# Patient Record
Sex: Female | Born: 1970 | Race: White | Hispanic: No | Marital: Single | Smoking: Former smoker
Health system: Southern US, Community
[De-identification: ages and names within clinical notes are randomized; demographics above are authoritative.]

## PROBLEM LIST (undated history)

## (undated) DIAGNOSIS — K219 Gastro-esophageal reflux disease without esophagitis: Secondary | ICD-10-CM

## (undated) DIAGNOSIS — F419 Anxiety disorder, unspecified: Secondary | ICD-10-CM

## (undated) DIAGNOSIS — F32A Depression, unspecified: Secondary | ICD-10-CM

## (undated) DIAGNOSIS — G8929 Other chronic pain: Secondary | ICD-10-CM

## (undated) DIAGNOSIS — I499 Cardiac arrhythmia, unspecified: Secondary | ICD-10-CM

## (undated) DIAGNOSIS — G4730 Sleep apnea, unspecified: Secondary | ICD-10-CM

## (undated) DIAGNOSIS — M1990 Unspecified osteoarthritis, unspecified site: Secondary | ICD-10-CM

## (undated) DIAGNOSIS — S3992XA Unspecified injury of lower back, initial encounter: Secondary | ICD-10-CM

## (undated) DIAGNOSIS — I10 Essential (primary) hypertension: Secondary | ICD-10-CM

## (undated) DIAGNOSIS — D649 Anemia, unspecified: Secondary | ICD-10-CM

## (undated) DIAGNOSIS — Z5189 Encounter for other specified aftercare: Secondary | ICD-10-CM

## (undated) DIAGNOSIS — I471 Supraventricular tachycardia, unspecified: Secondary | ICD-10-CM

## (undated) DIAGNOSIS — F329 Major depressive disorder, single episode, unspecified: Secondary | ICD-10-CM

## (undated) DIAGNOSIS — K760 Fatty (change of) liver, not elsewhere classified: Secondary | ICD-10-CM

## (undated) DIAGNOSIS — M549 Dorsalgia, unspecified: Secondary | ICD-10-CM

## (undated) HISTORY — PX: WRIST SURGERY: SHX841

## (undated) HISTORY — DX: Major depressive disorder, single episode, unspecified: F32.9

## (undated) HISTORY — DX: Depression, unspecified: F32.A

## (undated) HISTORY — DX: Anxiety disorder, unspecified: F41.9

## (undated) HISTORY — DX: Gastro-esophageal reflux disease without esophagitis: K21.9

## (undated) HISTORY — DX: Sleep apnea, unspecified: G47.30

## (undated) HISTORY — DX: Unspecified injury of lower back, initial encounter: S39.92XA

## (undated) HISTORY — PX: TUBAL LIGATION: SHX77

## (undated) HISTORY — DX: Encounter for other specified aftercare: Z51.89

## (undated) HISTORY — DX: Anemia, unspecified: D64.9

---

## 2011-09-26 ENCOUNTER — Encounter (HOSPITAL_COMMUNITY): Payer: Self-pay | Admitting: *Deleted

## 2011-09-26 ENCOUNTER — Emergency Department (HOSPITAL_COMMUNITY)
Admission: EM | Admit: 2011-09-26 | Discharge: 2011-09-26 | Disposition: A | Discharge status: Home / Self Care | Payer: Self-pay | Attending: Emergency Medicine | Admitting: Emergency Medicine

## 2011-09-26 ENCOUNTER — Emergency Department (HOSPITAL_COMMUNITY): Payer: Self-pay

## 2011-09-26 DIAGNOSIS — R6889 Other general symptoms and signs: Secondary | ICD-10-CM | POA: Insufficient documentation

## 2011-09-26 DIAGNOSIS — IMO0002 Reserved for concepts with insufficient information to code with codable children: Secondary | ICD-10-CM

## 2011-09-26 LAB — CBC WITH DIFFERENTIAL/PLATELET
HCT: 40.6 % (ref 36.0–46.0)
Hemoglobin: 13.2 g/dL (ref 12.0–15.0)
Lymphs Abs: 2.7 10*3/uL (ref 0.7–4.0)
MCHC: 32.5 g/dL (ref 30.0–36.0)
Monocytes Absolute: 0.5 10*3/uL (ref 0.1–1.0)
Monocytes Relative: 5 % (ref 3–12)
Neutro Abs: 7.3 10*3/uL (ref 1.7–7.7)
Neutrophils Relative %: 68 % (ref 43–77)
RBC: 4.81 MIL/uL (ref 3.87–5.11)

## 2011-09-26 LAB — COMPREHENSIVE METABOLIC PANEL
Alkaline Phosphatase: 92 U/L (ref 39–117)
BUN: 6 mg/dL (ref 6–23)
CO2: 26 mEq/L (ref 19–32)
Chloride: 102 mEq/L (ref 96–112)
Creatinine, Ser: 0.61 mg/dL (ref 0.50–1.10)
GFR calc Af Amer: >90 mL/min (ref >90)
GFR calc non Af Amer: >90 mL/min (ref >90)
Glucose, Bld: 81 mg/dL (ref 70–99)
Potassium: 3.5 mEq/L (ref 3.5–5.1)
Total Bilirubin: 0.4 mg/dL (ref 0.3–1.2)

## 2011-09-26 MED ORDER — GLUCAGON HCL (RDNA) 1 MG IJ SOLR
1.0000 mg | Freq: Once | INTRAMUSCULAR | Status: AC
  Administered 2011-09-26: 1 mg via INTRAVENOUS
  Filled 2011-09-26: qty 1

## 2011-09-26 MED ORDER — ONDANSETRON HCL 4 MG/2ML IJ SOLN
4.0000 mg | Freq: Once | INTRAMUSCULAR | Status: AC
  Administered 2011-09-26: 4 mg via INTRAVENOUS
  Filled 2011-09-26: qty 2

## 2011-09-26 NOTE — ED Notes (Signed)
Food impaction , onset yesterday after eating pizza, vomiting mult times. Has similar episode in past.

## 2011-09-26 NOTE — ED Provider Notes (Signed)
History   This chart was scribed for Benny Lennert, MD by Charolett Bumpers . The patient was seen in room APA03/APA03. Patient's care was started at 1401.    CSN: [id number]  Arrival date & time 09/26/11  1208   First MD Initiated Contact with Patient 09/26/11 1401      No chief complaint on file.   (Consider location/radiation/quality/duration/timing/severity/associated sxs/prior treatment) HPI Comments: Pt reports that she was eating pizza yesterday, when she felt like something got stuck. Pt reports that she is able to swallow, but reports pain with swallowing. Pt states that the pain now radiates to her chest. Pt reports vomiting multiple times. Pt reports a h/o similar symptoms several years ago with a food impaction.     Patient is a 41 y.o. female presenting with foreign body swallowed. The history is provided by the patient.  Swallowed Foreign Body This is a new problem. The current episode started yesterday. The problem occurs constantly. The problem has been gradually worsening. Pertinent negatives include no chest pain, no abdominal pain and no headaches. The symptoms are aggravated by swallowing. Nothing relieves the symptoms. She has tried water for the symptoms. The treatment provided no relief.    History reviewed. No pertinent past medical history.  Past Surgical History  Procedure Date  . Wrist surgery     History reviewed. No pertinent family history.  History  Substance Use Topics  . Smoking status: Never Smoker   . Smokeless tobacco: Not on file  . Alcohol Use: No    OB History    Grav Para Term Preterm Abortions TAB SAB Ect Mult Living                  Review of Systems  Constitutional: Negative for fatigue.  HENT: Positive for trouble swallowing. Negative for congestion, sinus pressure and ear discharge.   Eyes: Negative for discharge.  Respiratory: Negative for cough.   Cardiovascular: Negative for chest pain.  Gastrointestinal:  Positive for vomiting. Negative for abdominal pain and diarrhea.  Genitourinary: Negative for frequency and hematuria.  Musculoskeletal: Negative for back pain.  Skin: Negative for rash.  Neurological: Negative for seizures and headaches.  Hematological: Negative.   Psychiatric/Behavioral: Negative for hallucinations.  All other systems reviewed and are negative.    Allergies  Review of patient's allergies indicates no known allergies.  Home Medications  No current outpatient prescriptions on file.  BP 119/75  Pulse 68  Temp 98.2 F (36.8 C) (Oral)  Resp 18  Ht 5\' 6"  (1.676 m)  Wt 237 lb (107.502 kg)  BMI 38.25 kg/m2  SpO2 100%  LMP 09/12/2011  Physical Exam  Constitutional: She is oriented to person, place, and time. She appears well-developed.  HENT:  Head: Normocephalic and atraumatic.  Right Ear: External ear normal.  Left Ear: External ear normal.  Nose: Nose normal.  Mouth/Throat: Oropharynx is clear and moist. No oropharyngeal exudate.  Eyes: Conjunctivae and EOM are normal. No scleral icterus.  Neck: Neck supple. No thyromegaly present.  Cardiovascular: Normal rate and regular rhythm.  Exam reveals no gallop and no friction rub.   No murmur heard. Pulmonary/Chest: No stridor. She has no wheezes. She has no rales. She exhibits no tenderness.  Abdominal: She exhibits no distension. There is no tenderness. There is no rebound.  Musculoskeletal: Normal range of motion. She exhibits no edema.  Lymphadenopathy:    She has no cervical adenopathy.  Neurological: She is oriented to person, place, and time. Coordination  normal.  Skin: No rash noted. No erythema.  Psychiatric: She has a normal mood and affect. Her behavior is normal.    ED Course  Procedures (including critical care time)  DIAGNOSTIC STUDIES: Oxygen Saturation is 100% on room air, normal by my interpretation.    COORDINATION OF CARE:  14:24-Discussed planned course of treatment with the patient,  who is agreeable at this time.     Labs Reviewed - No data to display No results found.   No diagnosis found.  I spoke with Dr. Kendell Bane and he will see the pt tomorrow.  She is able to drink fluids.  MDM     The chart was scribed for me under my direct supervision.  I personally performed the history, physical, and medical decision making and all procedures in the evaluation of this patient.Benny Lennert, MD 09/26/11 [phone]

## 2015-02-18 ENCOUNTER — Emergency Department (HOSPITAL_COMMUNITY)
Admission: EM | Admit: 2015-02-18 | Discharge: 2015-02-18 | Disposition: A | Discharge status: Home / Self Care | Payer: Self-pay | Attending: Emergency Medicine | Admitting: Emergency Medicine

## 2015-02-18 ENCOUNTER — Encounter (HOSPITAL_COMMUNITY): Payer: Self-pay

## 2015-02-18 ENCOUNTER — Emergency Department (HOSPITAL_COMMUNITY): Payer: Self-pay

## 2015-02-18 DIAGNOSIS — R531 Weakness: Secondary | ICD-10-CM

## 2015-02-18 DIAGNOSIS — Z79899 Other long term (current) drug therapy: Secondary | ICD-10-CM | POA: Insufficient documentation

## 2015-02-18 DIAGNOSIS — I10 Essential (primary) hypertension: Secondary | ICD-10-CM | POA: Insufficient documentation

## 2015-02-18 DIAGNOSIS — D649 Anemia, unspecified: Secondary | ICD-10-CM | POA: Insufficient documentation

## 2015-02-18 HISTORY — DX: Supraventricular tachycardia, unspecified: I47.10

## 2015-02-18 HISTORY — DX: Supraventricular tachycardia: I47.1

## 2015-02-18 HISTORY — DX: Essential (primary) hypertension: I10

## 2015-02-18 LAB — CBC WITH DIFFERENTIAL/PLATELET
Basophils Absolute: 0.1 10*3/uL (ref 0.0–0.1)
Basophils Relative: 0 %
EOS ABS: 0.4 10*3/uL (ref 0.0–0.7)
Eosinophils Relative: 3 %
HCT: 36.6 % (ref 36.0–46.0)
HEMOGLOBIN: 11.1 g/dL — AB (ref 12.0–15.0)
LYMPHS ABS: 3.2 10*3/uL (ref 0.7–4.0)
LYMPHS PCT: 26 %
MCH: 22.7 pg — AB (ref 26.0–34.0)
MCHC: 30.3 g/dL (ref 30.0–36.0)
MCV: 74.7 fL — AB (ref 78.0–100.0)
Monocytes Absolute: 0.8 10*3/uL (ref 0.1–1.0)
Monocytes Relative: 7 %
NEUTROS ABS: 7.9 10*3/uL — AB (ref 1.7–7.7)
NEUTROS PCT: 64 %
Platelets: 377 10*3/uL (ref 150–400)
RBC: 4.9 MIL/uL (ref 3.87–5.11)
RDW: 18.8 % — ABNORMAL HIGH (ref 11.5–15.5)
WBC: 12.3 10*3/uL — AB (ref 4.0–10.5)

## 2015-02-18 LAB — BASIC METABOLIC PANEL
Anion gap: 9 (ref 5–15)
BUN: 10 mg/dL (ref 6–20)
CHLORIDE: 108 mmol/L (ref 101–111)
CO2: 23 mmol/L (ref 22–32)
Calcium: 9 mg/dL (ref 8.9–10.3)
Creatinine, Ser: 0.63 mg/dL (ref 0.44–1.00)
GFR calc Af Amer: >60 mL/min (ref >60)
GFR calc non Af Amer: >60 mL/min (ref >60)
Glucose, Bld: 93 mg/dL (ref 65–99)
POTASSIUM: 3.6 mmol/L (ref 3.5–5.1)
SODIUM: 140 mmol/L (ref 135–145)

## 2015-02-18 LAB — URINALYSIS, ROUTINE W REFLEX MICROSCOPIC
Bilirubin Urine: NEGATIVE
Glucose, UA: NEGATIVE mg/dL
Hgb urine dipstick: NEGATIVE
KETONES UR: NEGATIVE mg/dL
NITRITE: NEGATIVE
PH: 5.5 (ref 5.0–8.0)
PROTEIN: NEGATIVE mg/dL
Specific Gravity, Urine: 1.025 (ref 1.005–1.030)

## 2015-02-18 LAB — TROPONIN I

## 2015-02-18 LAB — URINE MICROSCOPIC-ADD ON: RBC / HPF: NONE SEEN RBC/hpf (ref 0–5)

## 2015-02-18 LAB — CK: CK TOTAL: 100 U/L (ref 38–234)

## 2015-02-18 LAB — BRAIN NATRIURETIC PEPTIDE: B Natriuretic Peptide: 44 pg/mL (ref 0.0–100.0)

## 2015-02-18 MED ORDER — CENTRUM PO CHEW: 1.0000 | CHEWABLE_TABLET | Freq: Every day | ORAL | Status: DC

## 2015-02-18 MED ORDER — SODIUM CHLORIDE 0.9 % IV BOLUS (SEPSIS)
1000.0000 mL | Freq: Once | INTRAVENOUS | Status: AC
  Administered 2015-02-18: 1000 mL via INTRAVENOUS

## 2015-02-18 NOTE — Discharge Instructions (Signed)
As discussed, your evaluation today has been largely reassuring.  But, it is important that you monitor your condition carefully, and do not hesitate to return to the ED if you develop new, or concerning changes in your condition.   Your evaluation tonight is most notable for demonstration of anemia, consistent with vitamin deficiency.  Please use the prescribed supplements, and discuss having a repeat blood check with your primary care physician in 2 weeks.   Anemia, Nonspecific Anemia is a condition in which the concentration of red blood cells or hemoglobin in the blood is below normal. Hemoglobin is a substance in red blood cells that carries oxygen to the tissues of the body. Anemia results in not enough oxygen reaching these tissues.  CAUSES  Common causes of anemia include:   Excessive bleeding. Bleeding may be internal or external. This includes excessive bleeding from periods (in women) or from the intestine.   Poor nutrition.   Chronic kidney, thyroid, and liver disease.  Bone marrow disorders that decrease red blood cell production.  Cancer and treatments for cancer.  HIV, AIDS, and their treatments.  Spleen problems that increase red blood cell destruction.  Blood disorders.  Excess destruction of red blood cells due to infection, medicines, and autoimmune disorders. SIGNS AND SYMPTOMS   Minor weakness.   Dizziness.   Headache.  Palpitations.   Shortness of breath, especially with exercise.   Paleness.  Cold sensitivity.  Indigestion.  Nausea.  Difficulty sleeping.  Difficulty concentrating. Symptoms may occur suddenly or they may develop slowly.  DIAGNOSIS  Additional blood tests are often needed. These help your health care provider determine the best treatment. Your health care provider will check your stool for blood and look for other causes of blood loss.  TREATMENT  Treatment varies depending on the cause of the anemia. Treatment can  include:   Supplements of iron, vitamin B12, or folic acid.   Hormone medicines.   A blood transfusion. This may be needed if blood loss is severe.   Hospitalization. This may be needed if there is significant continual blood loss.   Dietary changes.  Spleen removal. HOME CARE INSTRUCTIONS Keep all follow-up appointments. It often takes many weeks to correct anemia, and having your health care provider check on your condition and your response to treatment is very important. SEEK IMMEDIATE MEDICAL CARE IF:   You develop extreme weakness, shortness of breath, or chest pain.   You become dizzy or have trouble concentrating.  You develop heavy vaginal bleeding.   You develop a rash.   You have bloody or black, tarry stools.   You faint.   You vomit up blood.   You vomit repeatedly.   You have abdominal pain.  You have a fever or persistent symptoms for more than 2-3 days.   You have a fever and your symptoms suddenly get worse.   You are dehydrated.  MAKE SURE YOU:  Understand these instructions.  Will watch your condition.  Will get help right away if you are not doing well or get worse.   This information is not intended to replace advice given to you by your health care provider. Make sure you discuss any questions you have with your health care provider.   Document Released: 03/03/2004 Document Revised: 09/26/2012 Document Reviewed: 07/20/2012 Elsevier Interactive Patient Education [employer]2016 Elsevier Inc.

## 2015-02-18 NOTE — ED Notes (Signed)
Pt reports has SVT and says had a "bad episode" 3 days ago and says hasn't felt like herself since then.  Pt says also has 3 hernias in her abd.  Reports nausea, fatigue, and SOB since the svt episode.  Pt states she was on metoprolol a few months ago but was taken off because was "doing so good."

## 2015-02-18 NOTE — ED Provider Notes (Signed)
CSN: [id number]     Arrival date & time 02/18/15  1527 History   First MD Initiated Contact with Patient 02/18/15 1730     Chief Complaint  Patient presents with  . Tachycardia    HPI  Patient presents with concern of ongoing diffuse weakness, hypersomnolence. She has a history of hypertension, supraventricular tachycardia She no longer takes any medication for her supraventricular tachycardia. 3 days ago she had a typical episode of sustained supraventricular tachycardia. Subsequent, she has felt persistently weak, without focal weakness, confusion, disorientation, syncope. She describes hypersomnolence as well. There is been generalized weight gain, though for greater than the past 3 days. Patient continues to take Lasix as prescribed.   Past Medical History  Diagnosis Date  . SVT (supraventricular tachycardia) (HCC)   . Hypertension    Past Surgical History  Procedure Laterality Date  . Wrist surgery     No family history on file. Social History  Substance Use Topics  . Smoking status: Never Smoker   . Smokeless tobacco: None  . Alcohol Use: No   OB History    No data available     Review of Systems  Constitutional:       Per HPI, otherwise negative  HENT:       Per HPI, otherwise negative  Respiratory:       Per HPI, otherwise negative  Cardiovascular:       Per HPI, otherwise negative  Gastrointestinal: Negative for vomiting.  Endocrine:       Negative aside from HPI  Genitourinary:       Neg aside from HPI   Musculoskeletal:       Per HPI, otherwise negative  Skin: Negative.   Neurological: Negative for syncope.      Allergies  Review of patient's allergies indicates no known allergies.  Home Medications   Prior to Admission medications   Medication Sig Start Date End Date Taking? Authorizing Provider  furosemide (LASIX) 20 MG tablet Take 20 mg by mouth daily.   Yes Historical Provider, MD   BP 107/53 mmHg  Pulse 69  Temp(Src) 97.9 F  (36.6 C) (Oral)  Resp 14  Ht 5\' 6"  (1.676 m)  Wt 267 lb (121.11 kg)  BMI 43.12 kg/m2  SpO2 99%  LMP 01/25/2015 Physical Exam  Constitutional: She is oriented to person, place, and time. She appears well-developed and well-nourished. No distress.  HENT:  Head: Normocephalic and atraumatic.  Eyes: Conjunctivae and EOM are normal.  Cardiovascular: Normal rate and regular rhythm.   Pulmonary/Chest: Effort normal and breath sounds normal. No stridor. No respiratory distress.  Abdominal: She exhibits no distension.  Musculoskeletal: She exhibits no edema.  Neurological: She is alert and oriented to person, place, and time. No cranial nerve deficit.  Skin: Skin is warm and dry.  Psychiatric: She has a normal mood and affect.  Nursing note and vitals reviewed.   ED Course  Procedures (including critical care time) Labs Review Labs Reviewed  CBC WITH DIFFERENTIAL/PLATELET - Abnormal; Notable for the following:    WBC 12.3 (*)    Hemoglobin 11.1 (*)    MCV 74.7 (*)    MCH 22.7 (*)    RDW 18.8 (*)    Neutro Abs 7.9 (*)    All other components within normal limits  BASIC METABOLIC PANEL  TROPONIN I  BRAIN NATRIURETIC PEPTIDE  URINALYSIS, ROUTINE W REFLEX MICROSCOPIC (NOT AT [facility])  CK    Imaging Review Dg Chest 2 View  02/18/2015  CLINICAL DATA:  Nausea fatigue and shortness of breath for 3 days EXAM: CHEST  2 VIEW COMPARISON:  09/26/2011 FINDINGS: The heart size and mediastinal contours are within normal limits. Both lungs are clear. The visualized skeletal structures are unremarkable. IMPRESSION: No active cardiopulmonary disease. Electronically Signed   By: Kennith Center M.D.   On: 02/18/2015 15:54   I have personally reviewed and evaluated these images and lab results as part of my medical decision-making.   EKG Interpretation   Date/Time:  Wednesday February 18 2015 15:39:07 EST Ventricular Rate:  73 PR Interval:  168 QRS Duration: 106 QT Interval:  398 QTC Calculation:  438 R Axis:   -5 Text Interpretation:  Normal sinus rhythm Normal ECG Sinus rhythm  hypertrophic changes Borderline ECG Confirmed by Gerhard Munch  MD  ([id number]) on 02/18/2015 6:29:05 PM     Pulse oximetry 97% room air normal  8:27 PM On repeat exam the patient is in no distress. On monitor she can do to have sinus rhythm, with occasional PAC, rate 60s. Pulse oximetry is 99% on room air She, her daughter and I had a lengthy conversation about all results, including anemia. Patient will be provided referral to local cardiology, primary care.   MDM  Patient presents with generalized weakness. Patient is awake, alert, in no distress. No evidence for sustained arrhythmia, no evidence for ongoing coronary ischemia, decompensated heart failure. No evidence for pneumonia. Patient does have evidence for vitamin deficiency anemia. Patient discharged stable condition after initiation of supplements to follow-up with local providers.  Gerhard Munch, MD 02/18/15 2028

## 2015-03-18 ENCOUNTER — Telehealth: Payer: Self-pay | Admitting: Orthopaedic Surgery

## 2015-03-18 NOTE — Telephone Encounter (Signed)
Patient called to inquire about appointment with Dr Hilda Lias, as has seen him in past for her back, work-related injury.  States she went to [facility] Emergency Room yesterday 03/17/15, for a  New back injury, also work-related; states was told "herniated disk".  Relayed that our clinic does not treat herniated disk, however, since patient "wants to see Dr Hilda Lias again if possible", we would need notes, films, reports, from [place], but also, her Workers Administrator would need to contact our office, per protocol.  Patient states she has already contacted her Workers Designer, industrial/product.

## 2015-08-25 ENCOUNTER — Encounter (HOSPITAL_COMMUNITY): Payer: Self-pay

## 2015-08-25 ENCOUNTER — Emergency Department (HOSPITAL_COMMUNITY): Payer: Self-pay

## 2015-08-25 ENCOUNTER — Emergency Department (HOSPITAL_COMMUNITY)
Admission: EM | Admit: 2015-08-25 | Discharge: 2015-08-25 | Disposition: A | Discharge status: Home / Self Care | Payer: Self-pay | Attending: Emergency Medicine | Admitting: Emergency Medicine

## 2015-08-25 DIAGNOSIS — R609 Edema, unspecified: Secondary | ICD-10-CM | POA: Insufficient documentation

## 2015-08-25 DIAGNOSIS — Z791 Long term (current) use of non-steroidal anti-inflammatories (NSAID): Secondary | ICD-10-CM | POA: Insufficient documentation

## 2015-08-25 DIAGNOSIS — M5441 Lumbago with sciatica, right side: Secondary | ICD-10-CM | POA: Insufficient documentation

## 2015-08-25 DIAGNOSIS — M549 Dorsalgia, unspecified: Secondary | ICD-10-CM

## 2015-08-25 DIAGNOSIS — M5431 Sciatica, right side: Secondary | ICD-10-CM

## 2015-08-25 DIAGNOSIS — G8929 Other chronic pain: Secondary | ICD-10-CM | POA: Insufficient documentation

## 2015-08-25 DIAGNOSIS — I10 Essential (primary) hypertension: Secondary | ICD-10-CM | POA: Insufficient documentation

## 2015-08-25 DIAGNOSIS — Z79899 Other long term (current) drug therapy: Secondary | ICD-10-CM | POA: Insufficient documentation

## 2015-08-25 MED ORDER — FUROSEMIDE 20 MG PO TABS: 20.0000 mg | ORAL_TABLET | Freq: Every day | ORAL | Status: DC

## 2015-08-25 MED ORDER — IBUPROFEN 800 MG PO TABS: 800.0000 mg | ORAL_TABLET | Freq: Three times a day (TID) | ORAL | Status: DC | PRN

## 2015-08-25 MED ORDER — CYCLOBENZAPRINE HCL 5 MG PO TABS: 5.0000 mg | ORAL_TABLET | Freq: Three times a day (TID) | ORAL | Status: DC | PRN

## 2015-08-25 MED ORDER — IBUPROFEN 800 MG PO TABS
800.0000 mg | ORAL_TABLET | Freq: Once | ORAL | Status: AC
  Administered 2015-08-25: 800 mg via ORAL
  Filled 2015-08-25: qty 1

## 2015-08-25 MED ORDER — CYCLOBENZAPRINE HCL 10 MG PO TABS
10.0000 mg | ORAL_TABLET | Freq: Once | ORAL | Status: DC
  Filled 2015-08-25: qty 1

## 2015-08-25 NOTE — ED Provider Notes (Addendum)
CSN: [id number]     Arrival date & time 08/25/15  1036 History   First MD Initiated Contact with Patient 08/25/15 1113     Chief Complaint  Patient presents with  . Back Pain     (Consider location/radiation/quality/duration/timing/severity/associated sxs/prior Treatment) Patient is a 45 y.o. female presenting with back pain.  Back Pain Location:  Lumbar spine Quality:  Aching and stiffness Stiffness is present:  All day Radiates to:  Does not radiate Pain severity:  Severe Pain is:  Same all the time Onset quality:  Gradual Duration:  5 months Timing:  Constant Progression:  Worsening Chronicity:  New Context: occupational injury   Relieved by:  Nothing Worsened by:  Movement, standing and twisting Ineffective treatments:  None tried Associated symptoms: no abdominal pain, no abdominal swelling, no bladder incontinence, no bowel incontinence, no chest pain, no dysuria, no fever, no numbness, no perianal numbness, no tingling and no weakness     Past Medical History  Diagnosis Date  . SVT (supraventricular tachycardia) (HCC)   . Hypertension    Past Surgical History  Procedure Laterality Date  . Wrist surgery     No family history on file. Social History  Substance Use Topics  . Smoking status: Never Smoker   . Smokeless tobacco: None  . Alcohol Use: No   OB History    No data available     Review of Systems  Constitutional: Negative for fever, chills and fatigue.  HENT: Negative for congestion and rhinorrhea.   Eyes: Negative for visual disturbance.  Respiratory: Negative for cough and shortness of breath.   Cardiovascular: Negative for chest pain and leg swelling.  Gastrointestinal: Negative for nausea, vomiting, abdominal pain, diarrhea and bowel incontinence.  Genitourinary: Negative for bladder incontinence, dysuria and flank pain.  Musculoskeletal: Positive for back pain. Negative for neck pain and neck stiffness.  Skin: Negative for rash.   Neurological: Negative for tingling, syncope, weakness and numbness.  Hematological: Does not bruise/bleed easily.      Allergies  Review of patient's allergies indicates no known allergies.  Home Medications   Prior to Admission medications   Medication Sig Start Date End Date Taking? Authorizing Provider  furosemide (LASIX) 20 MG tablet Take 20 mg by mouth daily.   Yes Historical Provider, MD  ibuprofen (ADVIL,MOTRIN) 800 MG tablet Take 800 mg by mouth every 8 (eight) hours as needed for mild pain.   Yes Historical Provider, MD  multivitamin-iron-minerals-folic acid (CENTRUM) chewable tablet Chew 1 tablet by mouth daily. Patient not taking: Reported on 08/25/2015 02/18/15   Gerhard Munchobert Lockwood, MD   BP 128/71 mmHg  Pulse 59  Temp(Src) 98 F (36.7 C) (Oral)  Resp 18  Ht 5\' 6"  (1.676 m)  Wt 257 lb (116.574 kg)  BMI 41.50 kg/m2  SpO2 100%  LMP 08/25/2015 (Exact Date) Physical Exam  Constitutional: She is oriented to person, place, and time. She appears well-developed and well-nourished. No distress.  HENT:  Head: Normocephalic.  Mouth/Throat: No oropharyngeal exudate.  Eyes: Pupils are equal, round, and reactive to light.  Neck: Normal range of motion. Neck supple.  Cardiovascular: Normal rate, regular rhythm, normal heart sounds and intact distal pulses.  Exam reveals no friction rub.   No murmur heard. Pulmonary/Chest: Effort normal and breath sounds normal. No respiratory distress. She has no wheezes.  Abdominal: Soft. She exhibits no distension. There is no tenderness.  Musculoskeletal: She exhibits no edema.  Moderate TTP along paraspinal lower lumbar spine and SI joints. Minimal midline  TTP. No deformity.  Neurological: She is alert and oriented to person, place, and time.  Reflex Scores:      Patellar reflexes are 2+ on the right side and 2+ on the left side.      Achilles reflexes are 2+ on the right side and 2+ on the left side. 5/5 strength in hip flexor/extensors,  knee flexion/extension, dorsi/plantarflexion, toes Strength intact to light touch and pinprick in b/l LE distally and proximally  Skin: Skin is warm. No rash noted.  Nursing note and vitals reviewed.   LOWER EXTREMITY EXAM: LEFT  INSPECTION & PALPATION: No gross deformity.  Mild, nonpitting edema No open wounds.  Mild, diffuse TTP of distal leg  SENSORY: sensation is intact to light touch in:  Superficial peroneal nerve distribution (over dorsum of foot) Deep peroneal nerve distribution (over first dorsal web space) Sural nerve distribution (over lateral aspect 5th metatarsal) Saphenous nerve distribution (over medial instep)  MOTOR:  + Motor EHL (great toe dorsiflexion) + FHL (great toe plantar flexion)  + TA (ankle dorsiflexion)  + GSC (ankle plantar flexion)  VASCULAR: 2+ dorsalis pedis and posterior tibialis pulses Capillary refill < 2 sec, toes warm and well-perfused  COMPARTMENTS: Soft, warm, well-perfused No pain with passive extension No parethesias   ED Course  Procedures (including critical care time) Labs Review Labs Reviewed - No data to display  Imaging Review No results found. I have personally reviewed and evaluated these images and lab results as part of my medical decision-making.   EKG Interpretation None      MDM  45 yo F with PMHx of prior lower back injury due to occupational injury who p/w increasing back pain, tightness in setting of resuming work this week. Pain correlates directly with resuming work. Pt also c/o b/l, L>R LE swelling. She has h/o edema for which she takes lasix daily but ran out, though she states that asymmetry of swelling is new. Regarding her back pain, she is NVI distally with no red flag sx. No loss of bowel or bladder continence, saddle anesthesia, or signs of cauda equina. No fever, chills or signs of osteo or epidural abscess. No new neurological sx to suggest cord or root compression. Suspect acute on chronic LBP  with DDD, chronic sciatica. Will treat conservatively with nsaids, muscle relaxants and PCP f/u. Regarding her leg swelling, she does have mild calf TTP, and is fairly immobile at baseline and obese. U/S shows no evidence of DVT, however. Likely asymmetric, dependent edema with possible component of chronic hypervolemia as she has run out of her lasix. I instructed her on the importance of PCP f/u for management of her lasix but will provide with short course of lasix (home med) until she can f/u. WIll d/c home. Return precautions given.  Clinical Impression: 1. Chronic back pain   2. Sciatica, right   3. Dependent edema     Disposition: Discharge  Condition: Good  I have discussed the results, Dx and Tx plan with the pt(& family if present). He/she/they expressed understanding and agree(s) with the plan. Discharge instructions discussed at great length. Strict return precautions discussed and pt &/or family have verbalized understanding of the instructions. No further questions at time of discharge.    Discharge Medication List as of 08/25/2015  1:11 PM    START taking these medications   Details  cyclobenzaprine (FLEXERIL) 5 MG tablet Take 1 tablet (5 mg total) by mouth 3 (three) times daily as needed for muscle spasms., Starting  08/25/2015, Until Discontinued, Print        Follow Up: [facility] [address] [place] [address] [phone]  Call if you would like to follow-up with a primary care doctor in [place] or with the [facility] system  [facility] [address] [place] [address] [phone]         Shaune Pollack, MD 08/25/15 1835  Shaune Pollack, MD 08/25/15 [phone]

## 2015-08-25 NOTE — ED Notes (Signed)
Pt returned from CT °

## 2015-08-25 NOTE — ED Notes (Signed)
Original back injury in feb ( workman comp ) n ow past 2 days increased pain unable to sleep  . Also left leg swelling and out of lasix

## 2015-08-25 NOTE — ED Notes (Signed)
Patient transported to CT 

## 2016-01-10 ENCOUNTER — Encounter (HOSPITAL_COMMUNITY): Payer: Self-pay | Admitting: Emergency Medicine

## 2016-01-10 ENCOUNTER — Emergency Department (HOSPITAL_COMMUNITY)
Admission: EM | Admit: 2016-01-10 | Discharge: 2016-01-10 | Disposition: A | Discharge status: Home / Self Care | Payer: Self-pay | Attending: Emergency Medicine | Admitting: Emergency Medicine

## 2016-01-10 DIAGNOSIS — I10 Essential (primary) hypertension: Secondary | ICD-10-CM | POA: Insufficient documentation

## 2016-01-10 DIAGNOSIS — Z79899 Other long term (current) drug therapy: Secondary | ICD-10-CM | POA: Insufficient documentation

## 2016-01-10 DIAGNOSIS — N760 Acute vaginitis: Secondary | ICD-10-CM | POA: Insufficient documentation

## 2016-01-10 DIAGNOSIS — B9689 Other specified bacterial agents as the cause of diseases classified elsewhere: Secondary | ICD-10-CM

## 2016-01-10 LAB — URINALYSIS, ROUTINE W REFLEX MICROSCOPIC
BILIRUBIN URINE: NEGATIVE
GLUCOSE, UA: NEGATIVE mg/dL
Hgb urine dipstick: NEGATIVE
KETONES UR: NEGATIVE mg/dL
LEUKOCYTES UA: NEGATIVE
Nitrite: NEGATIVE
PH: 5.5 (ref 5.0–8.0)
PROTEIN: NEGATIVE mg/dL
Specific Gravity, Urine: 1.01 (ref 1.005–1.030)

## 2016-01-10 LAB — WET PREP, GENITAL
Clue Cells Wet Prep HPF POC: NONE SEEN
SPERM: NONE SEEN
Trich, Wet Prep: NONE SEEN
Yeast Wet Prep HPF POC: NONE SEEN

## 2016-01-10 LAB — PREGNANCY, URINE: Preg Test, Ur: NEGATIVE

## 2016-01-10 MED ORDER — CEFTRIAXONE SODIUM 250 MG IJ SOLR
250.0000 mg | Freq: Once | INTRAMUSCULAR | Status: AC
  Administered 2016-01-10: 250 mg via INTRAMUSCULAR
  Filled 2016-01-10: qty 250

## 2016-01-10 MED ORDER — AZITHROMYCIN 250 MG PO TABS
1000.0000 mg | ORAL_TABLET | Freq: Once | ORAL | Status: AC
  Administered 2016-01-10: 1000 mg via ORAL
  Filled 2016-01-10: qty 4

## 2016-01-10 MED ORDER — METRONIDAZOLE 500 MG PO TABS: 500.0000 mg | ORAL_TABLET | Freq: Two times a day (BID) | ORAL | 0 refills | Status: DC

## 2016-01-10 MED ORDER — STERILE WATER FOR INJECTION IJ SOLN
INTRAMUSCULAR | Status: AC
  Filled 2016-01-10: qty 10

## 2016-01-10 NOTE — ED Provider Notes (Signed)
[facility]PT Provider Note   CSN: [id number] Arrival date & time: 01/10/16  1140 By signing my name below, I, Levon HedgerElizabeth Hall, attest that this documentation has been prepared under the direction and in the presence of non-physician practitioner, Audry Piliyler Nyasiah Moffet, PA-C. Electronically Signed: Levon HedgerElizabeth Hall, Scribe. 01/10/2016. 12:18 PM.   History   Chief Complaint Chief Complaint  Patient presents with  . Urinary Tract Infection    HPI Donna Long is a 45 y.o. female who presents to the Emergency Department complaining of cramping pelvic pain onset 5 days ago. Pt states she had sex with a new partner one week ago and the condom came off. Two days after the encounter, she states she felt "irritated". Pt also notes associated vaginal itching, frequent urination, malodorous urine, and vaginal discharge. No alleviating or modifying factors noted. No treatments reported. She denies any nausea, vomiting or any other associated symptoms.   The history is provided by the patient. No language interpreter was used.    Past Medical History:  Diagnosis Date  . Hypertension   . SVT (supraventricular tachycardia) (HCC)     There are no active problems to display for this patient.   Past Surgical History:  Procedure Laterality Date  . WRIST SURGERY      OB History    No data available       Home Medications    Prior to Admission medications   Medication Sig Start Date End Date Taking? Authorizing Provider  cyclobenzaprine (FLEXERIL) 5 MG tablet Take 1 tablet (5 mg total) by mouth 3 (three) times daily as needed for muscle spasms. 08/25/15   Shaune Pollackameron Isaacs, MD  furosemide (LASIX) 20 MG tablet Take 1 tablet (20 mg total) by mouth daily. 08/25/15   Shaune Pollackameron Isaacs, MD  ibuprofen (ADVIL,MOTRIN) 800 MG tablet Take 1 tablet (800 mg total) by mouth every 8 (eight) hours as needed for mild pain or moderate pain. 08/25/15   Shaune Pollackameron Isaacs, MD  multivitamin-iron-minerals-folic acid (CENTRUM)  chewable tablet Chew 1 tablet by mouth daily. Patient not taking: Reported on 08/25/2015 02/18/15   Gerhard Munchobert Lockwood, MD    Family History No family history on file.  Social History Social History  Substance Use Topics  . Smoking status: Never Smoker  . Smokeless tobacco: Never Used  . Alcohol use No     Allergies   Patient has no known allergies.   Review of Systems Review of Systems 10 systems reviewed and all are negative for acute change except as noted in the HPI.  Physical Exam Updated Vital Signs BP 127/69 (BP Location: Left Arm)   Pulse 72   Temp 97.9 F (36.6 C) (Oral)   Resp 16   Ht 5\' 6"  (1.676 m)   Wt 253 lb (114.8 kg)   LMP 01/01/2016   SpO2 100%   BMI 40.84 kg/m   Physical Exam  Constitutional: She is oriented to person, place, and time. Vital signs are normal. She appears well-developed and well-nourished. No distress.  HENT:  Head: Normocephalic and atraumatic.  Right Ear: Hearing normal.  Left Ear: Hearing normal.  Eyes: Conjunctivae and EOM are normal. Pupils are equal, round, and reactive to light.  Neck: Normal range of motion. Neck supple.  Cardiovascular: Normal rate and regular rhythm.   Pulmonary/Chest: Effort normal and breath sounds normal.  Abdominal: Soft. Bowel sounds are normal. She exhibits no distension. There is no tenderness.  Musculoskeletal: Normal range of motion.  Neurological: She is alert and oriented to person, place, and  time.  Skin: Skin is warm and dry.  Psychiatric: She has a normal mood and affect. Her speech is normal and behavior is normal. Thought content normal.  Nursing note and vitals reviewed.  Exam performed by Eston Estersyler M Wendelin Reader,  exam chaperoned Date: 01/10/2016 Pelvic exam: normal external genitalia without evidence of trauma. VULVA: normal appearing vulva with no masses, tenderness or lesion. VAGINA: normal appearing vagina with normal color and discharge, no lesions. CERVIX: normal appearing cervix without  lesions, cervical motion tenderness absent, cervical os closed with out purulent discharge; vaginal discharge - clear, Wet prep and DNA probe for chlamydia and GC obtained.   ADNEXA: normal adnexa in size, nontender and no masses UTERUS: uterus is normal size, shape, consistency and nontender.   ED Treatments / Results  DIAGNOSTIC STUDIES:  Oxygen Saturation is 100% on RA, normal by my interpretation.    COORDINATION OF CARE:  12:07 PM Discussed treatment plan with pt at bedside and pt agreed to plan.  Labs (all labs ordered are listed, but only abnormal results are displayed) Labs Reviewed  WET PREP, GENITAL - Abnormal; Notable for the following:       Result Value   WBC, Wet Prep HPF POC FEW (*)    All other components within normal limits  URINALYSIS, ROUTINE W REFLEX MICROSCOPIC (NOT AT [facility])  PREGNANCY, URINE  GC/CHLAMYDIA PROBE AMP ([place]) NOT AT [facility]    EKG  EKG Interpretation None       Radiology No results found.  Procedures Procedures (including critical care time)  Medications Ordered in ED Medications - No data to display  Initial Impression / Assessment and Plan / ED Course  I have reviewed the triage vital signs and the nursing notes.  Pertinent labs & imaging results that were available during my care of the patient were reviewed by me and considered in my medical decision making (see chart for details).  Clinical Course     Final Clinical Impressions(s) / ED Diagnoses  I have reviewed and evaluated the relevant laboratory values. I have reviewed the relevant previous healthcare records. Patient discussed with supervising physician.  ED Course:  Assessment: Pt is a 45yF who presents with lower abdominal pain x5 days. No dysuria. Notes discharge. Requests STD check. On exam, pt in NAD. Nontoxic/nonseptic appearing. VSS. Afebrile. Lungs CTA. Heart RRR. Abdomen nontender soft. GU exam without CMT or adnexal tenderness. Malodorous. Discharge  noted. UA unremarkable. GC obtained. Wet Prep with mild white cells Given Azithro/Rocephin in ED. Plan is to DC home with follow up to PCP. Given Rx Flagyl due to exam . At time of discharge, Patient is in no acute distress. Vital Signs are stable. Patient is able to ambulate. Patient able to tolerate PO.   Disposition/Plan:   DC Home Additional Verbal discharge instructions given and discussed with patient.  Pt Instructed to f/u with PCP in the next week for evaluation and treatment of symptoms. Return precautions given Pt acknowledges and agrees with plan  Supervising Physician Bethann BerkshireJoseph Zammit, MD   Final diagnoses:  BV (bacterial vaginosis)   New Prescriptions New Prescriptions   No medications on file   I personally performed the services described in this documentation, which was scribed in my presence. The recorded information has been reviewed and is accurate.    Audry Piliyler Kaiesha Tonner, PA-C 01/10/16 1301    Bethann BerkshireJoseph Zammit, MD 01/12/16 [phone]

## 2016-01-10 NOTE — ED Triage Notes (Signed)
Pt c/o frequent urination and malodorous urine.

## 2016-01-10 NOTE — Discharge Instructions (Signed)
Please read and follow all provided instructions.  Your diagnoses today include:  1. BV (bacterial vaginosis)     Tests performed today include: Vital signs. See below for your results today.   Medications prescribed:  Take as prescribed   Home care instructions:  Follow any educational materials contained in this packet.  Follow-up instructions: Please follow-up with your primary care provider for further evaluation of symptoms and treatment   Return instructions:  Please return to the Emergency Department if you do not get better, if you get worse, or new symptoms OR  - Fever (temperature greater than 101.37F)  - Bleeding that does not stop with holding pressure to the area    -Severe pain (please note that you may be more sore the day after your accident)  - Chest Pain  - Difficulty breathing  - Severe nausea or vomiting  - Inability to tolerate food and liquids  - Passing out  - Skin becoming red around your wounds  - Change in mental status (confusion or lethargy)  - New numbness or weakness    Please return if you have any other emergent concerns.  Additional Information:  Your vital signs today were: BP 127/69 (BP Location: Left Arm)    Pulse 72    Temp 97.9 F (36.6 C) (Oral)    Resp 16    Ht 5\' 6"  (1.676 m)    Wt 114.8 kg    LMP 01/01/2016    SpO2 100%    BMI 40.84 kg/m  If your blood pressure (BP) was elevated above 135/85 this visit, please have this repeated by your doctor within one month. ---------------

## 2016-01-11 LAB — GC/CHLAMYDIA PROBE AMP (~~LOC~~) NOT AT ARMC
CHLAMYDIA, DNA PROBE: NEGATIVE
NEISSERIA GONORRHEA: NEGATIVE

## 2016-03-19 ENCOUNTER — Encounter (HOSPITAL_COMMUNITY): Payer: Self-pay | Admitting: Emergency Medicine

## 2016-03-19 ENCOUNTER — Emergency Department (HOSPITAL_COMMUNITY)
Admission: EM | Admit: 2016-03-19 | Discharge: 2016-03-19 | Disposition: A | Discharge status: Home / Self Care | Payer: Self-pay | Attending: Emergency Medicine | Admitting: Emergency Medicine

## 2016-03-19 ENCOUNTER — Emergency Department (HOSPITAL_COMMUNITY): Payer: Self-pay

## 2016-03-19 DIAGNOSIS — Z791 Long term (current) use of non-steroidal anti-inflammatories (NSAID): Secondary | ICD-10-CM | POA: Insufficient documentation

## 2016-03-19 DIAGNOSIS — M549 Dorsalgia, unspecified: Secondary | ICD-10-CM | POA: Insufficient documentation

## 2016-03-19 DIAGNOSIS — R1030 Lower abdominal pain, unspecified: Secondary | ICD-10-CM | POA: Insufficient documentation

## 2016-03-19 DIAGNOSIS — R300 Dysuria: Secondary | ICD-10-CM | POA: Insufficient documentation

## 2016-03-19 DIAGNOSIS — I10 Essential (primary) hypertension: Secondary | ICD-10-CM | POA: Insufficient documentation

## 2016-03-19 DIAGNOSIS — R52 Pain, unspecified: Secondary | ICD-10-CM

## 2016-03-19 LAB — CBC WITH DIFFERENTIAL/PLATELET
Basophils Absolute: 0 10*3/uL (ref 0.0–0.1)
Basophils Relative: 0 %
EOS PCT: 2 %
Eosinophils Absolute: 0.3 10*3/uL (ref 0.0–0.7)
HEMATOCRIT: 31.9 % — AB (ref 36.0–46.0)
Hemoglobin: 9.1 g/dL — ABNORMAL LOW (ref 12.0–15.0)
LYMPHS ABS: 3.4 10*3/uL (ref 0.7–4.0)
LYMPHS PCT: 30 %
MCH: 20.6 pg — AB (ref 26.0–34.0)
MCHC: 28.5 g/dL — AB (ref 30.0–36.0)
MCV: 72.2 fL — AB (ref 78.0–100.0)
MONO ABS: 0.7 10*3/uL (ref 0.1–1.0)
MONOS PCT: 7 %
NEUTROS ABS: 6.9 10*3/uL (ref 1.7–7.7)
Neutrophils Relative %: 61 %
PLATELETS: 377 10*3/uL (ref 150–400)
RBC: 4.42 MIL/uL (ref 3.87–5.11)
RDW: 19.2 % — AB (ref 11.5–15.5)
WBC: 11.3 10*3/uL — ABNORMAL HIGH (ref 4.0–10.5)

## 2016-03-19 LAB — URINALYSIS, ROUTINE W REFLEX MICROSCOPIC
Bilirubin Urine: NEGATIVE
GLUCOSE, UA: NEGATIVE mg/dL
Hgb urine dipstick: NEGATIVE
Ketones, ur: NEGATIVE mg/dL
LEUKOCYTES UA: NEGATIVE
Nitrite: NEGATIVE
PH: 6 (ref 5.0–8.0)
PROTEIN: NEGATIVE mg/dL
Specific Gravity, Urine: 1.009 (ref 1.005–1.030)

## 2016-03-19 LAB — COMPREHENSIVE METABOLIC PANEL
ALT: 18 U/L (ref 14–54)
AST: 16 U/L (ref 15–41)
Albumin: 3.5 g/dL (ref 3.5–5.0)
Alkaline Phosphatase: 80 U/L (ref 38–126)
Anion gap: 5 (ref 5–15)
BILIRUBIN TOTAL: 0.3 mg/dL (ref 0.3–1.2)
BUN: 10 mg/dL (ref 6–20)
CHLORIDE: 104 mmol/L (ref 101–111)
CO2: 27 mmol/L (ref 22–32)
Calcium: 8.8 mg/dL — ABNORMAL LOW (ref 8.9–10.3)
Creatinine, Ser: 0.51 mg/dL (ref 0.44–1.00)
Glucose, Bld: 94 mg/dL (ref 65–99)
POTASSIUM: 3.5 mmol/L (ref 3.5–5.1)
Sodium: 136 mmol/L (ref 135–145)
TOTAL PROTEIN: 6.8 g/dL (ref 6.5–8.1)

## 2016-03-19 LAB — I-STAT BETA HCG BLOOD, ED (MC, WL, AP ONLY)

## 2016-03-19 MED ORDER — ONDANSETRON HCL 4 MG/2ML IJ SOLN
4.0000 mg | Freq: Once | INTRAMUSCULAR | Status: AC
  Administered 2016-03-19: 4 mg via INTRAVENOUS
  Filled 2016-03-19: qty 2

## 2016-03-19 MED ORDER — KETOROLAC TROMETHAMINE 30 MG/ML IJ SOLN
30.0000 mg | Freq: Once | INTRAMUSCULAR | Status: AC
  Administered 2016-03-19: 30 mg via INTRAVENOUS
  Filled 2016-03-19: qty 1

## 2016-03-19 MED ORDER — METRONIDAZOLE 500 MG PO TABS: 500.0000 mg | ORAL_TABLET | Freq: Two times a day (BID) | ORAL | 0 refills | Status: DC

## 2016-03-19 NOTE — ED Triage Notes (Signed)
Patient c/o right flank pain with dysuria and frequency. Per patient also having some lower abd pain. Denies any nausea, vomiting, diarrhea or fevers. Last normal BM this morning.

## 2016-03-19 NOTE — ED Provider Notes (Signed)
[facility] Provider Note   CSN: [id number] Arrival date & time: 03/19/16  1130     History   Chief Complaint Chief Complaint  Patient presents with  . Flank Pain    HPI Donna Long is a 46 y.o. female.  Patient complains of dysuria and flank pain   The history is provided by the patient. No language interpreter was used.  Dysuria   This is a recurrent problem. The current episode started 2 days ago. The problem occurs every urination. The problem has not changed since onset.The quality of the pain is described as burning. The pain is at a severity of 3/10. The pain is moderate. There has been no fever. Pertinent negatives include no chills, no frequency and no hematuria.    Past Medical History:  Diagnosis Date  . Hypertension   . SVT (supraventricular tachycardia) (HCC)     There are no active problems to display for this patient.   Past Surgical History:  Procedure Laterality Date  . WRIST SURGERY      OB History    No data available       Home Medications    Prior to Admission medications   Medication Sig Start Date End Date Taking? Authorizing Provider  ibuprofen (ADVIL,MOTRIN) 800 MG tablet Take 1 tablet (800 mg total) by mouth every 8 (eight) hours as needed for mild pain or moderate pain. 08/25/15  Yes Shaune Pollack, MD  metroNIDAZOLE (FLAGYL) 500 MG tablet Take 1 tablet (500 mg total) by mouth 2 (two) times daily. One po bid x 7 days 03/19/16   Bethann Berkshire, MD    Family History History reviewed. No pertinent family history.  Social History Social History  Substance Use Topics  . Smoking status: Never Smoker  . Smokeless tobacco: Never Used  . Alcohol use No     Allergies   Patient has no known allergies.   Review of Systems Review of Systems  Constitutional: Negative for appetite change, chills and fatigue.  HENT: Negative for congestion, ear discharge and sinus pressure.   Eyes: Negative for discharge.  Respiratory:  Negative for cough.   Cardiovascular: Negative for chest pain.  Gastrointestinal: Negative for abdominal pain and diarrhea.  Genitourinary: Positive for dysuria. Negative for frequency and hematuria.  Musculoskeletal: Positive for back pain.  Skin: Negative for rash.  Neurological: Negative for seizures and headaches.  Psychiatric/Behavioral: Negative for hallucinations.     Physical Exam Updated Vital Signs BP 143/75 (BP Location: Right Arm)   Pulse 66   Temp 97.8 F (36.6 C) (Oral)   Resp 18   Ht 5\' 6"  (1.676 m)   Wt 257 lb (116.6 kg)   LMP 03/06/2016 Comment: NEG BETA HCG 03/19/16  SpO2 100%   BMI 41.48 kg/m   Physical Exam  Constitutional: She is oriented to person, place, and time. She appears well-developed.  HENT:  Head: Normocephalic.  Eyes: Conjunctivae and EOM are normal. No scleral icterus.  Neck: Neck supple. No thyromegaly present.  Cardiovascular: Normal rate and regular rhythm.  Exam reveals no gallop and no friction rub.   No murmur heard. Pulmonary/Chest: No stridor. She has no wheezes. She has no rales. She exhibits no tenderness.  Abdominal: She exhibits no distension. There is no tenderness. There is no rebound.  Genitourinary:  Genitourinary Comments: Moderate right flank tenderness  Musculoskeletal: Normal range of motion. She exhibits no edema.  Lymphadenopathy:    She has no cervical adenopathy.  Neurological: She is oriented to person,  place, and time. She exhibits normal muscle tone. Coordination normal.  Skin: No rash noted. No erythema.  Psychiatric: She has a normal mood and affect. Her behavior is normal.     ED Treatments / Results  Labs (all labs ordered are listed, but only abnormal results are displayed) Labs Reviewed  URINALYSIS, ROUTINE W REFLEX MICROSCOPIC - Abnormal; Notable for the following:       Result Value   Color, Urine STRAW (*)    All other components within normal limits  CBC WITH DIFFERENTIAL/PLATELET - Abnormal;  Notable for the following:    WBC 11.3 (*)    Hemoglobin 9.1 (*)    HCT 31.9 (*)    MCV 72.2 (*)    MCH 20.6 (*)    MCHC 28.5 (*)    RDW 19.2 (*)    All other components within normal limits  COMPREHENSIVE METABOLIC PANEL - Abnormal; Notable for the following:    Calcium 8.8 (*)    All other components within normal limits  I-STAT BETA HCG BLOOD, ED ([facility], [facility], [facility] ONLY)    EKG  EKG Interpretation None       Radiology Ct Renal Stone Study  Result Date: 03/19/2016 CLINICAL DATA:  Right flank pain x1 week, dysuria, right lower abdominal pain EXAM: CT ABDOMEN AND PELVIS WITHOUT CONTRAST TECHNIQUE: Multidetector CT imaging of the abdomen and pelvis was performed following the standard protocol without IV contrast. COMPARISON:  None. FINDINGS: Lower chest: Lung bases are clear. Hepatobiliary: Hepatic steatosis with focal fatty sparing. Suspected layering gallstones (series 2/ image 33). No associated inflammatory changes. No intrahepatic or extrahepatic ductal dilatation. Pancreas: Within normal limits. Spleen: Within normal limits. Adrenals/Urinary Tract: Adrenal glands are within normal limits. Kidneys are within normal limits. No renal, ureteral, or bladder calculi.  No hydronephrosis. Bladder is underdistended but unremarkable. Stomach/Bowel: Stomach is within normal limits. No evidence of bowel obstruction. Normal appendix (series 2/ image 56). Vascular/Lymphatic: No evidence of abdominal aortic aneurysm. Retroaortic left renal vein. No suspicious abdominopelvic lymphadenopathy. Reproductive: Uterus is grossly unremarkable. Bilateral ovaries are within normal limits. Other: No abdominopelvic ascites. Musculoskeletal: Very mild degenerative changes of the lower thoracic spine IMPRESSION: No renal, ureteral, or bladder calculi.  No hydronephrosis. Suspected cholelithiasis, without associated inflammatory changes. No evidence of bowel obstruction.  Normal appendix. Hepatic steatosis with focal  fatty sparing. Electronically Signed   By: Charline Bills M.D.   On: 03/19/2016 17:47    Procedures Procedures (including critical care time)  Medications Ordered in ED Medications  ketorolac (TORADOL) 30 MG/ML injection 30 mg (30 mg Intravenous Given 03/19/16 1535)  ondansetron (ZOFRAN) injection 4 mg (4 mg Intravenous Given 03/19/16 1535)     Initial Impression / Assessment and Plan / ED Course  I have reviewed the triage vital signs and the nursing notes.  Pertinent labs & imaging results that were available during my care of the patient were reviewed by me and considered in my medical decision making (see chart for details).     Urinalysis unremarkable CT scan unremarkable patient states she's had vaginitis numerous times and has been treated with Flagyl. She states she thinks this is the same problem. Patient refuses pelvic exam now. I will empirically treat her with Flagyl and she is going to follow-up with her family doctor next week  Final Clinical Impressions(s) / ED Diagnoses   Final diagnoses:  Pain  Dysuria    New Prescriptions New Prescriptions   METRONIDAZOLE (FLAGYL) 500 MG TABLET  Take 1 tablet (500 mg total) by mouth 2 (two) times daily. One po bid x 7 days     Bethann BerkshireJoseph Kemari Narez, MD 03/19/16 1859

## 2016-03-19 NOTE — Discharge Instructions (Signed)
Follow up with your doctor after you finish the medicine

## 2016-03-19 NOTE — ED Notes (Signed)
nad noted prior to dc dc instuctions reviewed.

## 2016-10-03 ENCOUNTER — Ambulatory Visit: Payer: Self-pay | Admitting: Physician Assistant

## 2016-10-06 ENCOUNTER — Encounter: Payer: Self-pay | Admitting: Physician Assistant

## 2016-10-06 ENCOUNTER — Ambulatory Visit: Payer: Self-pay | Admitting: Physician Assistant

## 2016-10-06 VITALS — BP 130/74 | HR 87 | Temp 97.9°F | Ht 66.75 in | Wt 284.5 lb

## 2016-10-06 DIAGNOSIS — N63 Unspecified lump in unspecified breast: Secondary | ICD-10-CM

## 2016-10-06 DIAGNOSIS — Z131 Encounter for screening for diabetes mellitus: Secondary | ICD-10-CM

## 2016-10-06 DIAGNOSIS — Z13220 Encounter for screening for lipoid disorders: Secondary | ICD-10-CM

## 2016-10-06 DIAGNOSIS — R002 Palpitations: Secondary | ICD-10-CM

## 2016-10-06 DIAGNOSIS — D649 Anemia, unspecified: Secondary | ICD-10-CM

## 2016-10-06 NOTE — Progress Notes (Signed)
BP 130/74 (BP Location: Left Arm, Patient Position: Sitting, Cuff Size: Large)   Pulse 87   Temp 97.9 F (36.6 C)   Ht 5' 6.75" (1.695 m)   Wt 284 lb 8 oz (129 kg)   SpO2 97%   BMI 44.89 kg/m    Subjective:    Patient ID: Donna Long, female    DOB: 09-Sep-1970, 46 y.o.   MRN: [id number]  HPI: Donna Long is a 46 y.o. female presenting on 10/06/2016 for New Patient (Initial Visit) (pt states she had PCP in [place] none here. pt states she has been In [place] for 2 years) and Palpitations   HPI   Chief Complaint  Patient presents with  . New Patient (Initial Visit)    pt states she had PCP in [place] none here. pt states she has been In [place] for 2 years  . Palpitations   Pt with palpitations since age 38.  She continues to drink a lot of caffeine despite being told to stop.   Pt aware of anemia but she just didn't want to get any iron OTC.   Pt states last mammogram was many years ago.  She has "huge lump " L breast which has been there for 5-6 years.  It has been getting bigger.  It does not hurt her.    Pt states last PAP was 2 year ago. Pt says she had colposcopy then.   Pt states main reason she came in is because she had mouth sores last week and now has genital tingling with no rash or bumps yet.  Pt says her ex-husband had herpes.  this is the first time she ever broke out.  She has no bumps at present time  Pt is Not currently getting counseling.   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   Relevant past medical, surgical, family and social history reviewed and updated as indicated. Interim medical history since our last visit reviewed. Allergies and medications reviewed and updated.   Current Outpatient Prescriptions:  .  naproxen sodium (ANAPROX) 220 MG tablet, Take 220 mg by mouth as needed., Disp: , Rfl:   Review of Systems  Constitutional: Positive for fatigue. Negative for appetite change, chills, diaphoresis, fever and unexpected weight change.  HENT: Positive for  dental problem and mouth sores. Negative for congestion, drooling, ear pain, facial swelling, hearing loss, sneezing, sore throat, trouble swallowing and voice change.   Eyes: Positive for itching and visual disturbance. Negative for pain, discharge and redness.  Respiratory: Negative for cough, choking, shortness of breath and wheezing.   Cardiovascular: Positive for palpitations and leg swelling. Negative for chest pain.  Gastrointestinal: Negative for abdominal pain, blood in stool, constipation, diarrhea and vomiting.  Endocrine: Positive for polydipsia. Negative for cold intolerance and heat intolerance.  Genitourinary: Negative for decreased urine volume, dysuria and hematuria.  Musculoskeletal: Positive for arthralgias, back pain and gait problem.  Skin: Negative for rash.  Allergic/Immunologic: Negative for environmental allergies.  Neurological: Positive for headaches. Negative for seizures, syncope and light-headedness.  Hematological: Negative for adenopathy.  Psychiatric/Behavioral: Positive for agitation and dysphoric mood. Negative for suicidal ideas. The patient is nervous/anxious.     Per HPI unless specifically indicated above     Objective:    BP 130/74 (BP Location: Left Arm, Patient Position: Sitting, Cuff Size: Large)   Pulse 87   Temp 97.9 F (36.6 C)   Ht 5' 6.75" (1.695 m)   Wt 284 lb 8 oz (129 kg)  SpO2 97%   BMI 44.89 kg/m   Wt Readings from Last 3 Encounters:  10/06/16 284 lb 8 oz (129 kg)  03/19/16 257 lb (116.6 kg)  01/10/16 253 lb (114.8 kg)    Physical Exam  Constitutional: She is oriented to person, place, and time. She appears well-developed and well-nourished.  HENT:  Head: Normocephalic and atraumatic.  Mouth/Throat: Oropharynx is clear and moist. No oropharyngeal exudate.  Eyes: Pupils are equal, round, and reactive to light. Conjunctivae and EOM are normal.  Neck: Neck supple. No thyromegaly present.  Cardiovascular: Normal rate and  regular rhythm.   Pulmonary/Chest: Effort normal and breath sounds normal.  Abdominal: Soft. Bowel sounds are normal. She exhibits no mass. There is no hepatosplenomegaly. There is no tenderness.  Genitourinary: No breast swelling, tenderness, discharge or bleeding.  Genitourinary Comments: L breast with lump at 11 o'clock position.  Mass approximately 15mm diameter.  It is soft and freely mobile.  It is nontender (nurse Chief Financial OfficerDonna chaperone)  Musculoskeletal: She exhibits no edema.  Lymphadenopathy:    She has no cervical adenopathy.  Neurological: She is alert and oriented to person, place, and time. Gait normal.  Skin: Skin is warm and dry.  Psychiatric: She has a normal mood and affect. Her behavior is normal.  Vitals reviewed.    (unable to examine mouth where bups were because she has partial dentures are in and she doesn't want to remove it)  EKG- NSR at 70 bpm    Assessment & Plan:   Encounter Diagnoses  Name Primary?  . Palpitations Yes  . Anemia, unspecified type   . Breast lump   . Screening cholesterol level   . Morbid obesity (HCC)   . Screening for diabetes mellitus     -discussed with pt that mass feels like a lipoma but we will get diagnostic Mammogram to evaluate it -Record request sent for most recent PAP -pt to get Fasting labs tomorrow -counseled pt to Start taking otc iron for her anemia -counseled pt to Avoid caffeine due to palpitations -pt counseled to come in to office to be seen if she has another outbreak of "bumps"  -pt to follow up in one month.  RTO sooner prn

## 2016-10-14 ENCOUNTER — Other Ambulatory Visit (HOSPITAL_COMMUNITY)
Admission: RE | Admit: 2016-10-14 | Discharge: 2016-10-14 | Disposition: A | Discharge status: Home / Self Care | Payer: Self-pay | Source: Ambulatory Visit | Attending: Physician Assistant | Admitting: Physician Assistant

## 2016-10-14 DIAGNOSIS — R002 Palpitations: Secondary | ICD-10-CM | POA: Insufficient documentation

## 2016-10-14 DIAGNOSIS — D649 Anemia, unspecified: Secondary | ICD-10-CM | POA: Insufficient documentation

## 2016-10-14 DIAGNOSIS — Z13220 Encounter for screening for lipoid disorders: Secondary | ICD-10-CM | POA: Insufficient documentation

## 2016-10-14 DIAGNOSIS — Z131 Encounter for screening for diabetes mellitus: Secondary | ICD-10-CM | POA: Insufficient documentation

## 2016-10-14 LAB — COMPREHENSIVE METABOLIC PANEL
ALBUMIN: 3.7 g/dL (ref 3.5–5.0)
ALK PHOS: 87 U/L (ref 38–126)
ALT: 26 U/L (ref 14–54)
AST: 24 U/L (ref 15–41)
Anion gap: 6 (ref 5–15)
BILIRUBIN TOTAL: 0.5 mg/dL (ref 0.3–1.2)
BUN: 10 mg/dL (ref 6–20)
CALCIUM: 9.1 mg/dL (ref 8.9–10.3)
CO2: 26 mmol/L (ref 22–32)
CREATININE: 0.62 mg/dL (ref 0.44–1.00)
Chloride: 106 mmol/L (ref 101–111)
GFR calc Af Amer: >60 mL/min (ref >60)
GFR calc non Af Amer: >60 mL/min (ref >60)
GLUCOSE: 91 mg/dL (ref 65–99)
Potassium: 4.1 mmol/L (ref 3.5–5.1)
Sodium: 138 mmol/L (ref 135–145)
TOTAL PROTEIN: 6.7 g/dL (ref 6.5–8.1)

## 2016-10-14 LAB — CBC WITH DIFFERENTIAL/PLATELET
BASOS ABS: 0 10*3/uL (ref 0.0–0.1)
BASOS PCT: 0 %
Eosinophils Absolute: 0.2 10*3/uL (ref 0.0–0.7)
Eosinophils Relative: 3 %
HEMATOCRIT: 33.7 % — AB (ref 36.0–46.0)
HEMOGLOBIN: 10.2 g/dL — AB (ref 12.0–15.0)
LYMPHS PCT: 27 %
Lymphs Abs: 2.3 10*3/uL (ref 0.7–4.0)
MCH: 21.7 pg — ABNORMAL LOW (ref 26.0–34.0)
MCHC: 30.3 g/dL (ref 30.0–36.0)
MCV: 71.7 fL — AB (ref 78.0–100.0)
MONOS PCT: 6 %
Monocytes Absolute: 0.5 10*3/uL (ref 0.1–1.0)
NEUTROS ABS: 5.5 10*3/uL (ref 1.7–7.7)
Neutrophils Relative %: 64 %
Platelets: 359 10*3/uL (ref 150–400)
RBC: 4.7 MIL/uL (ref 3.87–5.11)
RDW: 20 % — ABNORMAL HIGH (ref 11.5–15.5)
WBC: 8.5 10*3/uL (ref 4.0–10.5)

## 2016-10-14 LAB — LIPID PANEL
CHOLESTEROL: 159 mg/dL (ref 0–200)
HDL: 36 mg/dL — ABNORMAL LOW (ref >40)
LDL Cholesterol: 95 mg/dL (ref 0–99)
Total CHOL/HDL Ratio: 4.4 RATIO
Triglycerides: 140 mg/dL (ref <150)
VLDL: 28 mg/dL (ref 0–40)

## 2016-10-14 LAB — HEMOGLOBIN A1C
HEMOGLOBIN A1C: 5.6 % (ref 4.8–5.6)
MEAN PLASMA GLUCOSE: 114.02 mg/dL

## 2016-10-14 LAB — TSH: TSH: 1.222 u[IU]/mL (ref 0.350–4.500)

## 2016-10-19 ENCOUNTER — Telehealth: Payer: Self-pay | Admitting: Student

## 2016-10-19 NOTE — Telephone Encounter (Signed)
Pt called requesting antibiotic because she states she has a "bacterial infection" in her vaginal area. Pt was informed that PA would have to examine patient before prescribing antibiotic. Pt was asked if she has used monistat and pt states she has not because she does not have a yeast infection. Pt states she is sure it is a "bacterial infection" because she has "been dealing with them all her life".  Pt was offered appointment for tomorrow (10-20-16), but pt stated she didn't want to schedule an appointment since she knew she would not come due to work. Pt was then offered an appointment for next week (week of 10-24-16), pt refused appointment and stated she would just go to the ER.

## 2016-11-03 ENCOUNTER — Encounter: Payer: Self-pay | Admitting: Physician Assistant

## 2016-11-03 ENCOUNTER — Ambulatory Visit: Payer: Self-pay | Admitting: Physician Assistant

## 2016-11-03 VITALS — BP 112/62 | HR 81 | Temp 98.1°F | Wt 285.0 lb

## 2016-11-03 DIAGNOSIS — G8929 Other chronic pain: Secondary | ICD-10-CM

## 2016-11-03 DIAGNOSIS — R002 Palpitations: Secondary | ICD-10-CM

## 2016-11-03 DIAGNOSIS — D649 Anemia, unspecified: Secondary | ICD-10-CM

## 2016-11-03 DIAGNOSIS — N63 Unspecified lump in unspecified breast: Secondary | ICD-10-CM

## 2016-11-03 DIAGNOSIS — F419 Anxiety disorder, unspecified: Secondary | ICD-10-CM

## 2016-11-03 DIAGNOSIS — M5440 Lumbago with sciatica, unspecified side: Principal | ICD-10-CM

## 2016-11-03 NOTE — Progress Notes (Signed)
BP 112/62 (BP Location: Left Arm, Patient Position: Sitting, Cuff Size: Normal)   Pulse 81   Temp 98.1 F (36.7 C)   Wt 285 lb (129.3 kg)   SpO2 93%   BMI 44.97 kg/m    Subjective:    Patient ID: Donna Long, female    DOB: Jun 01, 1970, 46 y.o.   MRN: [id number]  HPI: Keyetta Hollingworth is a 46 y.o. female presenting on 11/03/2016 for Follow-up   HPI   Pt has appt to get scheduled for her diagnostic mammogram that we ordered at her last OV  Pt states back pain.  She says it has been going on for years.  She was scanned at [facility] ER.  She has pain going down both legs  She has cut out caffeine but still has the palpitations.  She says she gets CP with it.  Pt says she has had SVT in the past.  She says she was supposed to get an ablation in the past but "they didn't do it".    EKG done 10/06/16 was normal.  CT at [facility]- 03/17/2015- disc herniation, joint fragmentation, DJD, spinal stenosis   Relevant past medical, surgical, family and social history reviewed and updated as indicated. Interim medical history since our last visit reviewed. Allergies and medications reviewed and updated.   Current Outpatient Prescriptions:  .  IRON PO, Take by mouth., Disp: , Rfl:  .  naproxen sodium (ANAPROX) 220 MG tablet, Take 220 mg by mouth as needed., Disp: , Rfl:    Review of Systems  Constitutional: Negative for appetite change, chills, diaphoresis, fatigue, fever and unexpected weight change.  HENT: Positive for dental problem. Negative for congestion, drooling, ear pain, facial swelling, hearing loss, mouth sores, sneezing, sore throat, trouble swallowing and voice change.   Eyes: Positive for itching. Negative for pain, discharge, redness and visual disturbance.  Respiratory: Negative for cough, choking, shortness of breath and wheezing.   Cardiovascular: Positive for palpitations and leg swelling. Negative for chest pain.  Gastrointestinal: Positive for abdominal pain. Negative  for blood in stool, constipation, diarrhea and vomiting.  Endocrine: Negative for cold intolerance, heat intolerance and polydipsia.  Genitourinary: Negative for decreased urine volume, dysuria and hematuria.  Musculoskeletal: Positive for arthralgias and gait problem. Negative for back pain.  Skin: Negative for rash.  Allergic/Immunologic: Negative for environmental allergies.  Neurological: Positive for light-headedness. Negative for seizures, syncope and headaches.  Hematological: Negative for adenopathy.  Psychiatric/Behavioral: Positive for agitation and dysphoric mood. Negative for suicidal ideas. The patient is nervous/anxious.     Per HPI unless specifically indicated above     Objective:    BP 112/62 (BP Location: Left Arm, Patient Position: Sitting, Cuff Size: Normal)   Pulse 81   Temp 98.1 F (36.7 C)   Wt 285 lb (129.3 kg)   SpO2 93%   BMI 44.97 kg/m   Wt Readings from Last 3 Encounters:  11/03/16 285 lb (129.3 kg)  10/06/16 284 lb 8 oz (129 kg)  03/19/16 257 lb (116.6 kg)    Physical Exam  Constitutional: She is oriented to person, place, and time. She appears well-developed and well-nourished.  HENT:  Head: Normocephalic and atraumatic.  Neck: Neck supple.  Cardiovascular: Normal rate and regular rhythm.   No murmur heard. Pulmonary/Chest: Effort normal and breath sounds normal.  Abdominal: Soft. Bowel sounds are normal. She exhibits no mass. There is no hepatosplenomegaly. There is no tenderness.  Musculoskeletal: She exhibits no edema.  Lumbar back: She exhibits decreased range of motion and tenderness.  + SLR about 45 deg bilaterally  Lymphadenopathy:    She has no cervical adenopathy.  Neurological: She is alert and oriented to person, place, and time.  Skin: Skin is warm and dry.  Psychiatric: She has a normal mood and affect. Her behavior is normal.  Vitals reviewed.    Results for orders placed or performed during the hospital encounter of  10/14/16  CBC w/Diff/Platelet  Result Value Ref Range   WBC 8.5 4.0 - 10.5 K/uL   RBC 4.70 3.87 - 5.11 MIL/uL   Hemoglobin 10.2 (L) 12.0 - 15.0 g/dL   HCT 16.1 (L) 09.6 - 04.5 %   MCV 71.7 (L) 78.0 - 100.0 fL   MCH 21.7 (L) 26.0 - 34.0 pg   MCHC 30.3 30.0 - 36.0 g/dL   RDW 40.9 (H) 81.1 - 91.4 %   Platelets 359 150 - 400 K/uL   Neutrophils Relative % 64 %   Neutro Abs 5.5 1.7 - 7.7 K/uL   Lymphocytes Relative 27 %   Lymphs Abs 2.3 0.7 - 4.0 K/uL   Monocytes Relative 6 %   Monocytes Absolute 0.5 0.1 - 1.0 K/uL   Eosinophils Relative 3 %   Eosinophils Absolute 0.2 0.0 - 0.7 K/uL   Basophils Relative 0 %   Basophils Absolute 0.0 0.0 - 0.1 K/uL  Comprehensive Metabolic Panel (CMET)  Result Value Ref Range   Sodium 138 135 - 145 mmol/L   Potassium 4.1 3.5 - 5.1 mmol/L   Chloride 106 101 - 111 mmol/L   CO2 26 22 - 32 mmol/L   Glucose, Bld 91 65 - 99 mg/dL   BUN 10 6 - 20 mg/dL   Creatinine, Ser 7.82 0.44 - 1.00 mg/dL   Calcium 9.1 8.9 - 95.6 mg/dL   Total Protein 6.7 6.5 - 8.1 g/dL   Albumin 3.7 3.5 - 5.0 g/dL   AST 24 15 - 41 U/L   ALT 26 14 - 54 U/L   Alkaline Phosphatase 87 38 - 126 U/L   Total Bilirubin 0.5 0.3 - 1.2 mg/dL   GFR calc non Af Amer >60 >60 mL/min   GFR calc Af Amer >60 >60 mL/min   Anion gap 6 5 - 15  HgB A1c  Result Value Ref Range   Hgb A1c MFr Bld 5.6 4.8 - 5.6 %   Mean Plasma Glucose 114.02 mg/dL  Lipid Profile  Result Value Ref Range   Cholesterol 159 0 - 200 mg/dL   Triglycerides 213 <086 mg/dL   HDL 36 (L) >57 mg/dL   Total CHOL/HDL Ratio 4.4 RATIO   VLDL 28 0 - 40 mg/dL   LDL Cholesterol 95 0 - 99 mg/dL  TSH  Result Value Ref Range   TSH 1.222 0.350 - 4.500 uIU/mL      Assessment & Plan:    Encounter Diagnoses  Name Primary?  . Chronic low back pain with sciatica, sciatica laterality unspecified, unspecified back pain laterality Yes  . Palpitations   . Anemia, unspecified type   . Morbid obesity (HCC)   . Anxiety   . Breast lump      -reviewed labs with pt -pt to Continue iron for anemia -Will refax record request for most recent PAP and colposcopy notes to previous PCP.  -record request sent to [facility] for back imaging. Received and reviewed -recommended pt contact [facility] for anxiety -refer to cardiology for palpitations with reported history of  SVT for which ablation was scheduled -gave [facility] discount application for pt to complete and submit -pt counseled to keep mammogram appt as scheduled -will Refer to orthopedics for back -pt to follow up 6 weeks

## 2016-11-04 DIAGNOSIS — F419 Anxiety disorder, unspecified: Secondary | ICD-10-CM | POA: Insufficient documentation

## 2016-11-04 DIAGNOSIS — D649 Anemia, unspecified: Secondary | ICD-10-CM | POA: Insufficient documentation

## 2016-11-07 ENCOUNTER — Encounter: Payer: Self-pay | Admitting: Physician Assistant

## 2016-11-15 NOTE — Progress Notes (Signed)
Cardiology Office Note   Date:  11/18/2016   ID:  Donna Long, DOB [birth year], MRN [id number]  PCP:  Jacquelin Hawking, PA-C  Cardiologist:   Charlton Haws, MD   No chief complaint on file.     History of Present Illness: Donna Long is a 46 y.o. female who presents for consultation regarding palpitations with history of SVT and chest pain Referred by Jacquelin Hawking PA-C She indicates cutting out caffeine. Can get SSCP with palpitations TSH normal on 10/14/16 She has been bothered by palpitations over last 3-4 years Describes multiple ER visits for SVT and getting Adenosine has not seen EP or discussed ablation. She was on beta blocker in past but said it made her BP too low Moved here to be near sister and mom. No chest pain dyspnea or syncope. Getting daily palpitations that can last 5-15 minutes     Past Medical History:  Diagnosis Date  . Anemia   . Anxiety   . Back injury   . Depression   . GERD (gastroesophageal reflux disease)   . Hypertension   . SVT (supraventricular tachycardia) (HCC)     Past Surgical History:  Procedure Laterality Date  . TUBAL LIGATION    . WRIST SURGERY Right      Current Outpatient Prescriptions  Medication Sig Dispense Refill  . IRON PO Take by mouth.    . naproxen sodium (ANAPROX) 220 MG tablet Take 220 mg by mouth as needed.     No current facility-administered medications for this visit.     Allergies:   Patient has no known allergies.    Social History:  The patient  reports that she quit smoking about 8 years ago. Her smoking use included Cigarettes. She has a 33.00 pack-year smoking history. She has never used smokeless tobacco. She reports that she does not drink alcohol or use drugs.   Family History:  The patient's family history includes COPD in her father; Diabetes in her maternal grandmother; Heart disease in her mother; Hypertension in her maternal aunt and mother.    ROS:  Please see the history of present  illness.   Otherwise, review of systems are positive for none.   All other systems are reviewed and negative.    PHYSICAL EXAM: VS:  BP 138/78   Pulse 70   Ht 5' 6.5" (1.689 m)   Wt 283 lb (128.4 kg)   SpO2 98%   BMI 44.99 kg/m  , BMI Body mass index is 44.99 kg/m. Affect appropriate Obese white female  HEENT: normal Neck supple with no adenopathy JVP normal no bruits no thyromegaly Lungs clear with no wheezing and good diaphragmatic motion Heart:  S1/S2 no murmur, no rub, gallop or click PMI normal Abdomen: benighn, BS positve, no tenderness, no AAA no bruit.  No HSM or HJR Distal pulses intact with no bruits No edema Neuro non-focal Skin warm and dry No muscular weakness    EKG:  10/06/16 SR rate 78 normal no pre excitation    Recent Labs: 10/14/2016: ALT 26; BUN 10; Creatinine, Ser 0.62; Hemoglobin 10.2; Platelets 359; Potassium 4.1; Sodium 138; TSH 1.222    Lipid Panel    Component Value Date/Time   CHOL 159 10/14/2016 0947   TRIG 140 10/14/2016 0947   HDL 36 (L) 10/14/2016 0947   CHOLHDL 4.4 10/14/2016 0947   VLDL 28 10/14/2016 0947   LDLCALC 95 10/14/2016 0947      Wt Readings from Last 3 Encounters:  11/18/16  283 lb (128.4 kg)  11/03/16 285 lb (129.3 kg)  10/06/16 284 lb 8 oz (129 kg)      Other studies Reviewed: Additional studies/ records that were reviewed today include: Notes form Dr Lenis Noon in [place] Notes primary labs and ECG .    ASSESSMENT AND PLAN:  1. Palpitations/SVT History event monitor and echo start Toporl 50 mg daily as BP is fine Refer to EP for possible ablation pending results of event monitor 2. Anemia  F/u primary iron supplements  3. Depression not clear if this is related to symptoms    Current medicines are reviewed at length with the patient today.  The patient does not have concerns regarding medicines.  The following changes have been made:  Toprol 50 mg   Labs/ tests ordered today include: Echo Event monitor  No  orders of the defined types were placed in this encounter.    Disposition:   FU with EP Dr Ladona Ridgel in [place]      Signed, Charlton Haws, MD  11/18/2016 10:12 AM    [facility] [address] [place], [place], [place]  [address] Phone: [phone]; Fax: [phone]

## 2016-11-18 ENCOUNTER — Encounter: Payer: Self-pay | Admitting: Cardiovascular Disease

## 2016-11-18 ENCOUNTER — Ambulatory Visit (INDEPENDENT_AMBULATORY_CARE_PROVIDER_SITE_OTHER): Payer: Self-pay | Admitting: Cardiovascular Disease

## 2016-11-18 VITALS — BP 138/78 | HR 70 | Ht 66.5 in | Wt 283.0 lb

## 2016-11-18 DIAGNOSIS — I471 Supraventricular tachycardia: Secondary | ICD-10-CM

## 2016-11-18 MED ORDER — METOPROLOL SUCCINATE ER 50 MG PO TB24: 50.0000 mg | ORAL_TABLET | Freq: Every day | ORAL | 3 refills | Status: DC

## 2016-11-18 NOTE — Patient Instructions (Signed)
Medication Instructions:  Your physician has recommended you make the following change in your medication:  Start Toprol XL 50 mg Daily   Labwork: NONE   Testing/Procedures: Your physician has recommended that you wear an event monitor. Event monitors are medical devices that record the heart's electrical activity. Doctors most often [place] these monitors to diagnose arrhythmias. Arrhythmias are problems with the speed or rhythm of the heartbeat. The monitor is a small, portable device. You can wear one while you do your normal daily activities. This is usually used to diagnose what is causing palpitations/syncope (passing out).  Your physician has requested that you have an echocardiogram. Echocardiography is a painless test that uses sound waves to create images of your heart. It provides your doctor with information about the size and shape of your heart and how well your heart's chambers and valves are working. This procedure takes approximately one hour. There are no restrictions for this procedure.    Follow-Up: You have been referred to Electrophysiology    Any Other Special Instructions Will Be Listed Below (If Applicable).     If you need a refill on your cardiac medications before your next appointment, please call your pharmacy.  Thank you for choosing [facility]!

## 2016-11-24 ENCOUNTER — Ambulatory Visit (INDEPENDENT_AMBULATORY_CARE_PROVIDER_SITE_OTHER): Payer: Self-pay | Admitting: Orthopaedic Surgery

## 2016-11-25 ENCOUNTER — Other Ambulatory Visit (HOSPITAL_COMMUNITY): Payer: Self-pay

## 2016-12-02 ENCOUNTER — Ambulatory Visit (HOSPITAL_COMMUNITY)
Admission: RE | Admit: 2016-12-02 | Discharge: 2016-12-02 | Disposition: A | Discharge status: Home / Self Care | Payer: Self-pay | Source: Ambulatory Visit | Attending: Cardiovascular Disease | Admitting: Cardiovascular Disease

## 2016-12-02 ENCOUNTER — Telehealth: Payer: Self-pay | Admitting: Cardiovascular Disease

## 2016-12-02 ENCOUNTER — Other Ambulatory Visit: Payer: Self-pay

## 2016-12-02 DIAGNOSIS — I471 Supraventricular tachycardia: Secondary | ICD-10-CM

## 2016-12-02 LAB — ECHOCARDIOGRAM COMPLETE
AOPV: 0.69 m/s
AV Area VTI index: 1.18 cm2/m2
AV Area VTI: 2.62 cm2
AV VEL mean LVOT/AV: 0.71
AV pk vel: 171 cm/s
AVAREAMEANV: 2.71 cm2
AVAREAMEANVIN: 1.16 cm2/m2
AVG: 5 mmHg
AVPG: 12 mmHg
CHL CUP AV PEAK INDEX: 1.12
CHL CUP AV VALUE AREA INDEX: 1.18
CHL CUP AV VEL: 2.74
DOP CAL AO MEAN VELOCITY: 105 cm/s
E decel time: 218 msec
E/e' ratio: 6.51
FS: 53 % — AB (ref 28–44)
IVS/LV PW RATIO, ED: 1.05
LA ID, A-P, ES: 40 mm
LA diam end sys: 40 mm
LA vol index: 21 mL/m2
LA vol: 48.9 mL
LADIAMINDEX: 1.72 cm/m2
LAVOLA4C: 34.6 mL
LV E/e'average: 6.51
LV PW d: 11.5 mm — AB (ref 0.6–1.1)
LV TDI E'LATERAL: 13.5
LV dias vol index: 38 mL/m2
LV dias vol: 90 mL (ref 46–106)
LV e' LATERAL: 13.5 cm/s
LV sys vol: 31 mL
LVEEMED: 6.51
LVOT VTI: 27.6 cm
LVOT area: 3.8 cm2
LVOT peak grad rest: 6 mmHg
LVOT peak vel: 118 cm/s
LVOTD: 22 mm
LVOTSV: 105 mL
LVOTVTI: 0.72 cm
LVSYSVOLIN: 13 mL/m2
MV Dec: 218
MV Peak grad: 3 mmHg
MV pk A vel: 69.4 m/s
MV pk E vel: 87.9 m/s
Simpson's disk: 65
Stroke v: 58 ml
TDI e' medial: 8.59
VTI: 38.3 cm
Valve area: 2.74 cm2

## 2016-12-02 NOTE — Telephone Encounter (Signed)
Pt has not received her monitor and has not heard anything from [employer]

## 2016-12-02 NOTE — Telephone Encounter (Signed)
Dr. Eden EmmsNishan ordered the event monitor on 10/12. The monitor was never ordered through [employer]. I entered it and called pt. to advise her. No answer, left message for pt. to return call.

## 2016-12-02 NOTE — Progress Notes (Signed)
*  PRELIMINARY RESULTS* Echocardiogram 2D Echocardiogram has been performed.  Stacey DrainWhite, Marda Breidenbach J 12/02/2016, 9:34 AM

## 2016-12-06 ENCOUNTER — Encounter: Payer: Self-pay | Admitting: Physician Assistant

## 2016-12-06 ENCOUNTER — Ambulatory Visit: Payer: Self-pay | Admitting: Physician Assistant

## 2016-12-06 VITALS — BP 118/74 | HR 71 | Temp 97.9°F | Ht 66.5 in | Wt 286.0 lb

## 2016-12-06 DIAGNOSIS — J0100 Acute maxillary sinusitis, unspecified: Secondary | ICD-10-CM

## 2016-12-06 MED ORDER — AMOXICILLIN 500 MG PO CAPS: 500.0000 mg | ORAL_CAPSULE | Freq: Three times a day (TID) | ORAL | 0 refills | Status: DC

## 2016-12-06 MED ORDER — METOPROLOL SUCCINATE ER 50 MG PO TB24: 50.0000 mg | ORAL_TABLET | Freq: Every day | ORAL | 0 refills | Status: DC

## 2016-12-06 NOTE — Progress Notes (Signed)
BP 118/74 (BP Location: Left Arm, Patient Position: Sitting, Cuff Size: Large)   Pulse 71   Temp 97.9 F (36.6 C) (Other (Comment))   Ht 5' 6.5" (1.689 m)   Wt 286 lb (129.7 kg)   LMP 12/03/2016 (Exact Date)   SpO2 99%   BMI 45.47 kg/m    Subjective:    Patient ID: Donna Long, female    DOB: 06-21-1970, 46 y.o.   MRN: [id number]  HPI: Donna Long is a 46 y.o. female presenting on 12/06/2016 for Sinusitis (c/o sinus headache and has had nosebleeds, and face "feels full of infection")   HPI   Chief Complaint  Patient presents with  . Sinusitis    c/o sinus headache and has had nosebleeds, and face "feels full of infection"     Pt started feeling sinus pain about 1 1/2 weeks ago. C/o HA.  States sinuses draining down back of her throat.  No ST. No earache.  No cough.   Feels dizzy when she bends over.  no fevers  Relevant past medical, surgical, family and social history reviewed and updated as indicated. Interim medical history since our last visit reviewed. Allergies and medications reviewed and updated.   Current Outpatient Prescriptions:  .  IRON PO, Take by mouth., Disp: , Rfl:  .  naproxen sodium (ANAPROX) 220 MG tablet, Take 220 mg by mouth as needed., Disp: , Rfl:  .  metoprolol succinate (TOPROL-XL) 50 MG 24 hr tablet, Take 1 tablet (50 mg total) by mouth daily. Take with or immediately following a meal. (Patient not taking: Reported on 12/06/2016), Disp: 90 tablet, Rfl: 3   Review of Systems  Constitutional: Positive for fatigue. Negative for appetite change, chills, diaphoresis, fever and unexpected weight change.  HENT: Positive for dental problem and sneezing. Negative for congestion, drooling, ear pain, facial swelling, hearing loss, mouth sores, sore throat, trouble swallowing and voice change.   Eyes: Positive for itching. Negative for pain, discharge, redness and visual disturbance.  Respiratory: Negative for cough, choking, shortness of breath and  wheezing.   Cardiovascular: Negative for chest pain, palpitations and leg swelling.  Gastrointestinal: Negative for abdominal pain, blood in stool, constipation, diarrhea and vomiting.  Endocrine: Negative for cold intolerance, heat intolerance and polydipsia.  Genitourinary: Negative for decreased urine volume, dysuria and hematuria.  Musculoskeletal: Positive for arthralgias, back pain and gait problem.  Skin: Negative for rash.  Allergic/Immunologic: Negative for environmental allergies.  Neurological: Positive for light-headedness and headaches. Negative for seizures and syncope.  Hematological: Negative for adenopathy.  Psychiatric/Behavioral: Positive for agitation and dysphoric mood. Negative for suicidal ideas. The patient is nervous/anxious.     Per HPI unless specifically indicated above     Objective:    BP 118/74 (BP Location: Left Arm, Patient Position: Sitting, Cuff Size: Large)   Pulse 71   Temp 97.9 F (36.6 C) (Other (Comment))   Ht 5' 6.5" (1.689 m)   Wt 286 lb (129.7 kg)   LMP 12/03/2016 (Exact Date)   SpO2 99%   BMI 45.47 kg/m   Wt Readings from Last 3 Encounters:  12/06/16 286 lb (129.7 kg)  11/18/16 283 lb (128.4 kg)  11/03/16 285 lb (129.3 kg)    Physical Exam  Constitutional: She is oriented to person, place, and time. She appears well-developed and well-nourished.  HENT:  Head: Normocephalic and atraumatic.  Right Ear: Hearing, tympanic membrane, external ear and ear canal normal.  Left Ear: Hearing, tympanic membrane, external ear and ear canal  normal.  Nose: Mucosal edema and rhinorrhea present. Right sinus exhibits maxillary sinus tenderness. Left sinus exhibits maxillary sinus tenderness.  Mouth/Throat: Uvula is midline and oropharynx is clear and moist. No oropharyngeal exudate.  Neck: Neck supple.  Cardiovascular: Normal rate and regular rhythm.   Pulmonary/Chest: Effort normal and breath sounds normal. She has no wheezes.  Lymphadenopathy:     She has no cervical adenopathy.  Neurological: She is alert and oriented to person, place, and time.  Skin: Skin is warm and dry.  Psychiatric: She has a normal mood and affect. Her behavior is normal.  Vitals reviewed.       Assessment & Plan:   Encounter Diagnosis  Name Primary?  . Acute maxillary sinusitis, recurrence not specified Yes     -rx amoxil. Counseled on symptomatic care -gave pt coupon and rx so she could afford to get her toprol xl.  She is to continue with cardiology for their recommendation on continuing/changing that rx -pt has follow up next week for routine care

## 2016-12-09 ENCOUNTER — Encounter (INDEPENDENT_AMBULATORY_CARE_PROVIDER_SITE_OTHER): Payer: Self-pay

## 2016-12-09 DIAGNOSIS — I471 Supraventricular tachycardia: Secondary | ICD-10-CM

## 2016-12-14 ENCOUNTER — Ambulatory Visit: Payer: Self-pay | Admitting: Physician Assistant

## 2016-12-19 ENCOUNTER — Encounter: Payer: Self-pay | Admitting: Physician Assistant

## 2016-12-26 ENCOUNTER — Ambulatory Visit (INDEPENDENT_AMBULATORY_CARE_PROVIDER_SITE_OTHER): Payer: Self-pay | Admitting: Internal Medicine

## 2016-12-26 ENCOUNTER — Encounter: Payer: Self-pay | Admitting: Internal Medicine

## 2016-12-26 VITALS — BP 138/82 | HR 70 | Ht 66.5 in | Wt 287.0 lb

## 2016-12-26 DIAGNOSIS — I471 Supraventricular tachycardia: Secondary | ICD-10-CM

## 2016-12-26 NOTE — Progress Notes (Signed)
HPI Donna Long is referred today by Dr. Eden EmmsNishan for evaluation of SVT. She is an obese 46 year old woman with a history of hypertension and chronic back pain, remote tobacco abuse, who stopped smoking 8 years ago. The patient developed palpitations at age 46 although she initially did not seek medical attention. As a young adult, she would have one or 2 episodes a year. Over the last 3-4 years, her episodes have increased in frequency and severity resulting in multiple emergency room visits. 2 years ago she moved from [place] to be closer to family in [place]. The patient states that her episodes start and stop suddenly and are associated with shortness of breath and chest pressure. She has not had syncope. She has had near syncope. Intravenous adenosine terminates the SVT when the episodes do not stop on her own. She has been placed on medical therapy with beta blockers, but has developed symptomatic bradycardia. She feels fatigued and tired on her medications. No Known Allergies   Current Outpatient Medications  Medication Sig Dispense Refill  . amoxicillin (AMOXIL) 500 MG capsule Take 1 capsule (500 mg total) by mouth 3 (three) times daily. 30 capsule 0  . IRON PO Take by mouth.    . metoprolol succinate (TOPROL-XL) 50 MG 24 hr tablet Take 1 tablet (50 mg total) by mouth daily. Take with or immediately following a meal. 30 tablet 0  . naproxen sodium (ANAPROX) 220 MG tablet Take 220 mg by mouth as needed.     No current facility-administered medications for this visit.      Past Medical History:  Diagnosis Date  . Anemia   . Anxiety   . Back injury   . Depression   . GERD (gastroesophageal reflux disease)   . Hypertension   . SVT (supraventricular tachycardia) (HCC)     ROS:   All systems reviewed and negative except as noted in the HPI.   Past Surgical History:  Procedure Laterality Date  . TUBAL LIGATION    . WRIST SURGERY Right      Family History    Problem Relation Age of Onset  . Hypertension Mother   . Heart disease Mother   . COPD Father   . Hypertension Maternal Aunt   . Diabetes Maternal Grandmother      Social History   Socioeconomic History  . Marital status: Single    Spouse name: Not on file  . Number of children: Not on file  . Years of education: Not on file  . Highest education level: Not on file  Social Needs  . Financial resource strain: Not on file  . Food insecurity - worry: Not on file  . Food insecurity - inability: Not on file  . Transportation needs - medical: Not on file  . Transportation needs - non-medical: Not on file  Occupational History  . Not on file  Tobacco Use  . Smoking status: Former Smoker    Packs/day: 1.00    Years: 33.00    Pack years: 33.00    Types: Cigarettes    Last attempt to quit: 04/02/2008    Years since quitting: 8.7  . Smokeless tobacco: Never Used  Substance and Sexual Activity  . Alcohol use: No  . Drug use: No  . Sexual activity: Not Currently    Birth control/protection: Surgical  Other Topics Concern  . Not on file  Social History Narrative  . Not on file     BP 138/82  Pulse 70   Ht 5' 6.5" (1.689 m)   Wt 287 lb (130.2 kg)   LMP 12/03/2016 (Exact Date)   SpO2 98%   BMI 45.63 kg/m   Physical Exam:  Morbidly obese appearing middle-aged woman, NAD HEENT: Unremarkable Neck:  6 cm JVD, no thyromegally Lymphatics:  No adenopathy Back:  No CVA tenderness Lungs:  Clear, with no wheezes, rales, or rhonchi HEART:  Regular rate rhythm, no murmurs, no rubs, no clicks Abd:  soft, positive bowel sounds, no organomegally, no rebound, no guarding Ext:  2 plus pulses, no edema, no cyanosis, no clubbing Skin:  No rashes no nodules Neuro:  CN II through XII intact, motor grossly intact  EKG - reviewed, sinus rhythm with no ventricular preexcitation  Assess/Plan: 1. SVT - I discussed the treatment options with the patient in detail. The risks, goals,  benefits, and expectations of catheter ablation were reviewed. She is currently trying to obtain insurance coverage. She will continue her medication for now and call us if she would like to proceed with catheter ablation. I would expect that she will hold her beta blocker for 2 days prior to any procedures. 2. Obesity -weight loss was discussed. 3. 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 She is encouraged not to start smoking.  Lewayne BuntingGregg Tinleigh Whitmire, M.D.

## 2016-12-26 NOTE — Patient Instructions (Signed)
Medication Instructions:  Your physician recommends that you continue on your current medications as directed. Please refer to the Current Medication list given to you today.   Labwork: NONE   Testing/Procedures: NONE   Follow-Up: Please call office when you are able to schedule Ablation  Any Other Special Instructions Will Be Listed Below (If Applicable)     If you need a refill on your cardiac medications before your next appointment, please call your pharmacy.  Thank you for choosing [facility]!

## 2017-01-26 ENCOUNTER — Encounter (INDEPENDENT_AMBULATORY_CARE_PROVIDER_SITE_OTHER): Payer: Self-pay | Admitting: Orthopaedic Surgery

## 2017-01-26 ENCOUNTER — Ambulatory Visit (INDEPENDENT_AMBULATORY_CARE_PROVIDER_SITE_OTHER): Payer: Self-pay

## 2017-01-26 ENCOUNTER — Ambulatory Visit (INDEPENDENT_AMBULATORY_CARE_PROVIDER_SITE_OTHER): Payer: Self-pay | Admitting: Orthopaedic Surgery

## 2017-01-26 VITALS — Ht 66.5 in | Wt 287.0 lb

## 2017-01-26 DIAGNOSIS — M545 Low back pain: Secondary | ICD-10-CM

## 2017-01-26 DIAGNOSIS — G8929 Other chronic pain: Secondary | ICD-10-CM

## 2017-01-26 NOTE — Addendum Note (Signed)
Addended by: Rogers SeedsYEATTS, Reegan Mctighe M on: 01/26/2017 09:44 AM   Modules accepted: Orders

## 2017-01-26 NOTE — Progress Notes (Signed)
Office Visit Note   Patient: Donna RoysLisa Long           Date of Birth: 05/25/1970           MRN: [id number] Visit Date: 01/26/2017              Requested by: Jacquelin HawkingMcElroy, Shannon, PA-C [address] [place], [place] [id number] PCP: Jacquelin HawkingMcElroy, Shannon, PA-C   Assessment & Plan: Visit Diagnoses:  1. Chronic bilateral low back pain, with sciatica presence unspecified     Plan: Patient had previous CT scan in February 2017 that showed some bulging at L3-4 with right posterior lateral disc herniation which which appeared to be foraminal and extraforaminal on the right.  Significant obesity with BMI of 45.6.  Will order MRI scan lumbar spine and see her back after scan for review.  We discussed her significant obesity she needs to talk possibly with a dietitian work on weight loss.  We discussed getting into a pool which she states feels really good for her.  Follow-Up Instructions: No Follow-up on file.   Orders:  Orders Placed This Encounter  Procedures  . XR Lumbar Spine 2-3 Views   No orders of the defined types were placed in this encounter.     Procedures: No procedures performed   Clinical Data: No additional findings.   Subjective: Chief Complaint  Patient presents with  . Lower Back - Pain    HPI 46 year old female with ongoing back pain for several years.  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  She states she has back pain when she standing.  She has had problems with SVT and is seeing a cardiologist.  She is on some metoprolol states she can only take half the dose since it makes her blood pressure drop when she gets up she gets lightheaded.  Complains of back pain at the lumbosacral junction states she has pain in her back that radiates down both legs down to the bottom of her feet over the plantar surface.  Review of Systems  positive for obesity, anxiety.  History of SVT she has a cardiology visit coming soon.  On-the-job injury to her back 2 years ago.  Patient's not working she is getting help from her son's with her bills.  14 point review of systems otherwise negative as it pertains HPI.  Previous right wrist surgery for ganglion cyst   Objective: Vital Signs: Ht 5' 6.5" (1.689 m)   Wt 287 lb (130.2 kg)   BMI 45.63 kg/m   Physical Exam  Constitutional: She is oriented to person, place, and time. She appears well-developed.  HENT:  Head: Normocephalic.  Right Ear: External ear normal.  Left Ear: External ear normal.  Eyes: Pupils are equal, round, and reactive to light.  Neck: No tracheal deviation present. No thyromegaly present.  Cardiovascular: Normal rate.  Occasional premature beat.  Pulmonary/Chest: Effort normal.  Abdominal: Soft.  Neurological: She is alert and oriented to person, place, and time.  Skin: Skin is warm and dry.  Psychiatric: She has a normal mood and affect. Her behavior is normal.    Ortho Exam planes of right anterior thigh pain with internal/external rotation without limitation.  She reports numbness in the anterior thigh with internal and external rotation of the hip.  Negative straight leg raising 90 degrees right  and left.  She is able to take a few steps on her toes and heels has some balance problems.  Quad strength takes hand resistance with some counter pull hamstrings.  Hip abductor or adductor's.  She has good gastrocsoleus strength right and left.  Counter pole of the gastrocs with testing anterior tib on the right not on the left.  EHL is strong right and left.  Specialty Comments:  No specialty comments available.  Imaging: No results found.   PMFS History: Patient Active Problem List   Diagnosis Date Noted  . Anemia 11/04/2016  . Anxiety 11/04/2016   Past Medical History:  Diagnosis Date  . Anemia   . Anxiety   . Back injury   . Depression   . GERD  (gastroesophageal reflux disease)   . Hypertension   . SVT (supraventricular tachycardia) (HCC)     Family History  Problem Relation Age of Onset  . Hypertension Mother   . Heart disease Mother   . COPD Father   . Hypertension Maternal Aunt   . Diabetes Maternal Grandmother     Past Surgical History:  Procedure Laterality Date  . TUBAL LIGATION    . WRIST SURGERY Right    Social History   Occupational History  . Not on file  Tobacco Use  . Smoking status: Former Smoker    Packs/day: 1.00    Years: 33.00    Pack years: 33.00    Types: Cigarettes    Last attempt to quit: 04/02/2008    Years since quitting: 8.8  . Smokeless tobacco: Never Used  Substance and Sexual Activity  . Alcohol use: No  . Drug use: No  . Sexual activity: Not Currently    Birth control/protection: Surgical

## 2017-02-02 ENCOUNTER — Telehealth (INDEPENDENT_AMBULATORY_CARE_PROVIDER_SITE_OTHER): Payer: Self-pay | Admitting: *Deleted

## 2017-02-02 NOTE — Telephone Encounter (Signed)
Pt has appt with [facility] for MRI on Mon Dec 31 at 3pm, pt is to arrive at 245pm to register. LMOVM for pt to return my call for appt information

## 2017-02-02 NOTE — Telephone Encounter (Signed)
Pt called back and aware of appt and she will contact [facility] office to schedule follow up appt

## 2017-02-03 ENCOUNTER — Encounter (HOSPITAL_COMMUNITY): Payer: Self-pay | Admitting: Emergency Medicine

## 2017-02-03 ENCOUNTER — Emergency Department (HOSPITAL_COMMUNITY)
Admission: EM | Admit: 2017-02-03 | Discharge: 2017-02-03 | Disposition: A | Discharge status: Home / Self Care | Payer: Self-pay | Attending: Emergency Medicine | Admitting: Emergency Medicine

## 2017-02-03 ENCOUNTER — Other Ambulatory Visit: Payer: Self-pay

## 2017-02-03 ENCOUNTER — Emergency Department (HOSPITAL_COMMUNITY): Payer: Self-pay

## 2017-02-03 DIAGNOSIS — K0889 Other specified disorders of teeth and supporting structures: Secondary | ICD-10-CM | POA: Insufficient documentation

## 2017-02-03 DIAGNOSIS — I10 Essential (primary) hypertension: Secondary | ICD-10-CM | POA: Insufficient documentation

## 2017-02-03 DIAGNOSIS — Z79899 Other long term (current) drug therapy: Secondary | ICD-10-CM | POA: Insufficient documentation

## 2017-02-03 DIAGNOSIS — Z87891 Personal history of nicotine dependence: Secondary | ICD-10-CM | POA: Insufficient documentation

## 2017-02-03 MED ORDER — ONDANSETRON HCL 4 MG/2ML IJ SOLN
4.0000 mg | Freq: Once | INTRAMUSCULAR | Status: AC
  Administered 2017-02-03: 4 mg via INTRAVENOUS
  Filled 2017-02-03: qty 2

## 2017-02-03 MED ORDER — IOPAMIDOL (ISOVUE-300) INJECTION 61%
75.0000 mL | Freq: Once | INTRAVENOUS | Status: AC | PRN
  Administered 2017-02-03: 75 mL via INTRAVENOUS

## 2017-02-03 MED ORDER — MORPHINE SULFATE (PF) 4 MG/ML IV SOLN
4.0000 mg | Freq: Once | INTRAVENOUS | Status: AC
  Administered 2017-02-03: 4 mg via INTRAVENOUS
  Filled 2017-02-03: qty 1

## 2017-02-03 MED ORDER — HYDROCODONE-ACETAMINOPHEN 5-325 MG PO TABS: 1.0000 | ORAL_TABLET | ORAL | 0 refills | Status: DC | PRN

## 2017-02-03 NOTE — ED Notes (Signed)
Pt reports that she has been on amoxicillin for the last 2 weeks for an inflamed tooth Had two root canals on the same tooth in a week Pain and discomfort and feels she has a "brain infection"

## 2017-02-03 NOTE — ED Notes (Signed)
To CT

## 2017-02-03 NOTE — ED Provider Notes (Signed)
[facility] EMERGENCY DEPARTMENT Provider Note   CSN: [id number] Arrival date & time: 02/03/17  1724     History   Chief Complaint Chief Complaint  Patient presents with  . Dental Pain    HPI Donna Long is a 46 y.o. female.  Patient is a 46 year old female who presents to the emergency department with a complaint of dental pain.  The patient states that she has had problems with cavities involving right upper teeth.  She had a feeling that failed.  She had to have 2 dental surgeries including root canals in 1 week.  The patient states she is continuing to have pain and discomfort and feels as though "her brain has an infection ".  She states she has tried ibuprofen and over-the-counter medications.  She states she is also been on Amoxil for nearly 10 days and she continues to have pain.  She request to be evaluated because she says she cannot take the pain any longer.  She has been trying to reach her dentist, but has not been successful in reaching this dentist on the emergency line.      Past Medical History:  Diagnosis Date  . Anemia   . Anxiety   . Back injury   . Depression   . GERD (gastroesophageal reflux disease)   . Hypertension   . SVT (supraventricular tachycardia) [facility](HCC)     Patient Active Problem List   Diagnosis Date Noted  . Anemia 11/04/2016  . Anxiety 11/04/2016    Past Surgical History:  Procedure Laterality Date  . TUBAL LIGATION    . WRIST SURGERY Right     OB History    No data available       Home Medications    Prior to Admission medications   Medication Sig Start Date End Date Taking? Authorizing Provider  amoxicillin (AMOXIL) 500 MG capsule Take 1 capsule (500 mg total) by mouth 3 (three) times daily. 12/06/16   Jacquelin HawkingMcElroy, Shannon, PA-C  IRON PO Take by mouth.    [provider]  metoprolol succinate (TOPROL-XL) 50 MG 24 hr tablet Take 1 tablet (50 mg total) by mouth daily. Take with or immediately following a meal.  12/06/16 03/06/17  Jacquelin HawkingMcElroy, Shannon, PA-C  naproxen sodium (ANAPROX) 220 MG tablet Take 220 mg by mouth as needed.    [provider]    Family History Family History  Problem Relation Age of Onset  . Hypertension Mother   . Heart disease Mother   . COPD Father   . Hypertension Maternal Aunt   . Diabetes Maternal Grandmother     Social History Social History   Tobacco Use  . Smoking status: Former Smoker    Packs/day: 1.00    Years: 33.00    Pack years: 33.00    Types: Cigarettes    Last attempt to quit: 04/02/2008    Years since quitting: 8.8  . Smokeless tobacco: Never Used  Substance Use Topics  . Alcohol use: No  . Drug use: No     Allergies   Patient has no known allergies.   Review of Systems Review of Systems  Constitutional: Negative for activity change.       All ROS Neg except as noted in HPI  HENT: Positive for dental problem. Negative for nosebleeds.   Eyes: Negative for photophobia and discharge.  Respiratory: Negative for cough, shortness of breath and wheezing.   Cardiovascular: Negative for chest pain and palpitations.  Gastrointestinal: Negative for abdominal pain and  blood in stool.  Genitourinary: Negative for dysuria, frequency and hematuria.  Musculoskeletal: Negative for arthralgias, back pain and neck pain.  Skin: Negative.   Neurological: Negative for dizziness, seizures and speech difficulty.  Psychiatric/Behavioral: Negative for confusion and hallucinations.     Physical Exam Updated Vital Signs BP (!) 155/86 (BP Location: Right Arm)   Pulse 63   Temp 98.3 F (36.8 C) (Oral)   Resp 19   Ht 5\' 6"  (1.676 m)   Wt 130.2 kg (287 lb)   LMP 01/04/2017   SpO2 100%   BMI 46.32 kg/m   Physical Exam  Constitutional: She is oriented to person, place, and time. She appears well-developed and well-nourished.  Non-toxic appearance.  HENT:  Head: Normocephalic.  Right Ear: Tympanic membrane and external ear normal.  Left Ear:  Tympanic membrane and external ear normal.  No abscess or swelling of the right upper gum.  No bleeding noted.  No facial swelling appreciated.  There is tenderness to any palpation of the right face.  Airway is patent.  There is no swelling under the tongue.  Eyes: EOM and lids are normal. Pupils are equal, round, and reactive to light.  Neck: Normal range of motion. Neck supple. Carotid bruit is not present.  Cardiovascular: Normal rate, regular rhythm, normal heart sounds, intact distal pulses and normal pulses.  Pulmonary/Chest: Breath sounds normal. No respiratory distress.  Abdominal: Soft. Bowel sounds are normal. There is no tenderness. There is no guarding.  Musculoskeletal: Normal range of motion.  Lymphadenopathy:       Head (right side): No submandibular adenopathy present.       Head (left side): No submandibular adenopathy present.    She has no cervical adenopathy.  Neurological: She is alert and oriented to person, place, and time. She has normal strength. No cranial nerve deficit or sensory deficit.  Skin: Skin is warm and dry.  Psychiatric: She has a normal mood and affect. Her speech is normal.  Nursing note and vitals reviewed.    ED Treatments / Results  Labs (all labs ordered are listed, but only abnormal results are displayed) Labs Reviewed - No data to display  EKG  EKG Interpretation None       Radiology No results found.  Procedures Procedures (including critical care time)  Medications Ordered in ED Medications - No data to display   Initial Impression / Assessment and Plan / ED Course  I have reviewed the triage vital signs and the nursing notes.  Pertinent labs & imaging results that were available during my care of the patient were reviewed by me and considered in my medical decision making (see chart for details).       Final Clinical Impressions(s) / ED Diagnoses MDM Vital signs within normal limits.  The patient has failed  outpatient antibiotic and anti-inflammatory pain medication therapy.  Will obtain a CT of the maxillofacial area to evaluate for abscess, mass, or other abnormalities that may be causing such severe pain.  Patient treated in the emergency department with IV pain medication.  The CT maxillofacial scan is negative for any abscess, gas, mass, or other abnormality.  I discussed the findings with the patient.  I discussed with her that she will need to see her dentist for additional evaluation as this is probably more of a nerve issue than it is anything else given these findings.  Patient is in agreement with this plan.   Final diagnoses:  Pain, dental    ED  Discharge Orders        Ordered    HYDROcodone-acetaminophen (NORCO/VICODIN) 5-325 MG tablet  Every 4 hours PRN     02/03/17 1929       Ivery Quale, PA-C 02/04/17 0100    Eber Hong, MD 02/04/17 [phone]

## 2017-02-03 NOTE — Discharge Instructions (Signed)
Your vital signs within normal limits.  Your oxygen level is 100% on room air.  A CT scan of your facial structures shows no evidence of abscess.  No fluid collection within your face.  No mass or tumor.  Please see your dentist concerning your pain following the 2 root canals.  Please continue ibuprofen every 6 hours with food.  Please use Norco for more severe pain.This medication may cause drowsiness. Please do not drink, drive, or participate in activity that requires concentration while taking this medication.

## 2017-02-03 NOTE — ED Triage Notes (Signed)
Pain to rt upper side of mouth since have 2 dental surgeries in one day last week.

## 2017-02-04 ENCOUNTER — Emergency Department (HOSPITAL_COMMUNITY): Admission: EM | Admit: 2017-02-04 | Discharge: 2017-02-04 | Discharge status: Against Medical Advice | Payer: Self-pay

## 2017-02-04 NOTE — ED Notes (Signed)
Called for triage no answer  

## 2017-02-06 ENCOUNTER — Ambulatory Visit (HOSPITAL_COMMUNITY)
Admission: RE | Admit: 2017-02-06 | Discharge: 2017-02-06 | Disposition: A | Discharge status: Home / Self Care | Payer: Self-pay | Source: Ambulatory Visit | Attending: Orthopaedic Surgery | Admitting: Orthopaedic Surgery

## 2017-02-06 DIAGNOSIS — M5126 Other intervertebral disc displacement, lumbar region: Secondary | ICD-10-CM | POA: Insufficient documentation

## 2017-02-06 DIAGNOSIS — M545 Low back pain: Secondary | ICD-10-CM | POA: Insufficient documentation

## 2017-02-06 DIAGNOSIS — G8929 Other chronic pain: Secondary | ICD-10-CM | POA: Insufficient documentation

## 2017-02-09 ENCOUNTER — Ambulatory Visit (INDEPENDENT_AMBULATORY_CARE_PROVIDER_SITE_OTHER): Payer: Self-pay | Admitting: Orthopaedic Surgery

## 2017-02-09 ENCOUNTER — Encounter (INDEPENDENT_AMBULATORY_CARE_PROVIDER_SITE_OTHER): Payer: Self-pay | Admitting: Orthopaedic Surgery

## 2017-02-09 VITALS — BP 140/83 | HR 88 | Ht 66.0 in | Wt 287.0 lb

## 2017-02-09 DIAGNOSIS — M5126 Other intervertebral disc displacement, lumbar region: Secondary | ICD-10-CM
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
## 2017-03-13 ENCOUNTER — Emergency Department (HOSPITAL_COMMUNITY)
Admission: EM | Admit: 2017-03-13 | Discharge: 2017-03-13 | Disposition: A | Discharge status: Home / Self Care | Payer: No Typology Code available for payment source | Attending: Emergency Medicine | Admitting: Emergency Medicine

## 2017-03-13 ENCOUNTER — Other Ambulatory Visit: Payer: Self-pay

## 2017-03-13 ENCOUNTER — Encounter (HOSPITAL_COMMUNITY): Payer: Self-pay | Admitting: Emergency Medicine

## 2017-03-13 ENCOUNTER — Emergency Department (HOSPITAL_COMMUNITY): Payer: No Typology Code available for payment source

## 2017-03-13 DIAGNOSIS — J111 Influenza due to unidentified influenza virus with other respiratory manifestations: Secondary | ICD-10-CM

## 2017-03-13 DIAGNOSIS — Z87891 Personal history of nicotine dependence: Secondary | ICD-10-CM | POA: Insufficient documentation

## 2017-03-13 DIAGNOSIS — R69 Illness, unspecified: Secondary | ICD-10-CM

## 2017-03-13 DIAGNOSIS — I10 Essential (primary) hypertension: Secondary | ICD-10-CM | POA: Insufficient documentation

## 2017-03-13 LAB — RAPID STREP SCREEN (MED CTR MEBANE ONLY): STREPTOCOCCUS, GROUP A SCREEN (DIRECT): NEGATIVE

## 2017-03-13 MED ORDER — IBUPROFEN 800 MG PO TABS
800.0000 mg | ORAL_TABLET | Freq: Once | ORAL | Status: AC
  Administered 2017-03-13: 800 mg via ORAL
  Filled 2017-03-13: qty 1

## 2017-03-13 MED ORDER — BENZONATATE 100 MG PO CAPS: 200.0000 mg | ORAL_CAPSULE | Freq: Three times a day (TID) | ORAL | 0 refills | Status: DC | PRN

## 2017-03-13 MED ORDER — IBUPROFEN 800 MG PO TABS: 800.0000 mg | ORAL_TABLET | Freq: Three times a day (TID) | ORAL | 0 refills | Status: DC | PRN

## 2017-03-13 MED ORDER — LIDOCAINE VISCOUS 2 % MT SOLN
15.0000 mL | Freq: Once | OROMUCOSAL | Status: AC
  Administered 2017-03-13: 15 mL via OROMUCOSAL
  Filled 2017-03-13: qty 15

## 2017-03-13 NOTE — ED Provider Notes (Signed)
[facility]ERGENCY [facility]PARTMENT Provider Note   CSN: [id number] Arrival date & time: 03/13/17  1426     History   Chief Complaint Chief Complaint  Patient presents with  . Sore Throat  . Generalized Body Aches    HPI Valerie RoysLisa Clarke is a 47 y.o. female  Presenting with a 2 day history of flu like symptoms which includes  sore throat, low grade fever, nonproductive cough and generalized body aches.  Symptoms do not include shortness of breath, chest pain,  Nausea, vomiting or diarrhea. Her pain is worsened with coughing, stating cough causes severe sharp pain from her upper chest into her throat and upper back.  Her sister was diagnosed with the flu 2 days ago.  Pt has not had a flu vaccine this year.  she has used warm salt gargles, warm tea and tylenol without relief.      .The history is provided by the patient.    Past Medical History:  Diagnosis Date  . Anemia   . Anxiety   . Back injury   . Depression   . GERD (gastroesophageal reflux disease)   . Hypertension   . SVT (supraventricular tachycardia) [facility](HCC)     Patient Active Problem List   Diagnosis Date Noted  . Anemia 11/04/2016  . Anxiety 11/04/2016    Past Surgical History:  Procedure Laterality Date  . TUBAL LIGATION    . WRIST SURGERY Right     OB History    No data available       Home Medications    Prior to Admission medications   Medication Sig Start Date End Date Taking? Authorizing Provider  amoxicillin (AMOXIL) 500 MG capsule Take 1 capsule (500 mg total) by mouth 3 (three) times daily. 12/06/16   Jacquelin HawkingMcElroy, Shannon, PA-C  benzonatate (TESSALON) 100 MG capsule Take 2 capsules (200 mg total) by mouth 3 (three) times daily as needed. 03/13/17   Burgess AmorIdol, Sami Roes, PA-C  HYDROcodone-acetaminophen (NORCO/VICODIN) 5-325 MG tablet Take 1 tablet by mouth every 4 (four) hours as needed. 02/03/17   Ivery QualeBryant, Hobson, PA-C  ibuprofen (ADVIL,MOTRIN) 800 MG tablet Take 1 tablet (800 mg total) by mouth every 8 (eight)  hours as needed for fever or moderate pain. 03/13/17   Burgess AmorIdol, Grethel Zenk, PA-C  IRON PO Take by mouth.    [provider]  metoprolol succinate (TOPROL-XL) 50 MG 24 hr tablet Take 1 tablet (50 mg total) by mouth daily. Take with or immediately following a meal. 12/06/16 03/06/17  Jacquelin HawkingMcElroy, Shannon, PA-C  naproxen sodium (ANAPROX) 220 MG tablet Take 220 mg by mouth as needed.    [provider]    Family History Family History  Problem Relation Age of Onset  . Hypertension Mother   . Heart disease Mother   . COPD Father   . Hypertension Maternal Aunt   . Diabetes Maternal Grandmother     Social History Social History   Tobacco Use  . Smoking status: Former Smoker    Packs/day: 1.00    Years: 33.00    Pack years: 33.00    Types: Cigarettes    Last attempt to quit: 04/02/2008    Years since quitting: 8.9  . Smokeless tobacco: Never Used  Substance Use Topics  . Alcohol use: No  . Drug use: No     Allergies   Clindamycin/lincomycin   Review of Systems Review of Systems  Constitutional: Positive for fever. Negative for chills.  HENT: Positive for sore throat. Negative for congestion, ear pain,  rhinorrhea, sinus pressure, trouble swallowing and voice change.   Eyes: Negative for discharge.  Respiratory: Positive for cough. Negative for shortness of breath, wheezing and stridor.   Cardiovascular: Positive for chest pain.  Gastrointestinal: Negative for abdominal pain.  Genitourinary: Negative.   Musculoskeletal: Positive for myalgias.     Physical Exam Updated Vital Signs BP (!) 125/58   Pulse 96   Temp 98.5 F (36.9 C) (Oral)   Resp 18   LMP 03/09/2017   SpO2 100%   Physical Exam  Constitutional: She is oriented to person, place, and time. She appears well-developed and well-nourished.  HENT:  Head: Normocephalic and atraumatic.  Nose: No mucosal edema or rhinorrhea.  Mouth/Throat: Uvula is midline and mucous membranes are normal. No oral lesions. No  trismus in the jaw. No uvula swelling. Posterior oropharyngeal erythema present. No oropharyngeal exudate, posterior oropharyngeal edema or tonsillar abscesses. Tonsils are 2+ on the right. Tonsils are 2+ on the left. No tonsillar exudate.  Tonsils symmetric, no peritonsillar abscess. Voice sounds hoarse.  Eyes: Conjunctivae are normal.  Cardiovascular: Normal rate and normal heart sounds.  Pulmonary/Chest: Effort normal. No respiratory distress. She has no wheezes. She has no rales.  Musculoskeletal: Normal range of motion.  Lymphadenopathy:    She has no cervical adenopathy.    She has no axillary adenopathy.  Neurological: She is alert and oriented to person, place, and time.  Skin: Skin is warm and dry. No rash noted.  Psychiatric: She has a normal mood and affect.  Nursing note and vitals reviewed.    ED Treatments / Results  Labs (all labs ordered are listed, but only abnormal results are displayed) Labs Reviewed  RAPID STREP SCREEN (NOT AT [facility])  CULTURE, GROUP A STREP [facility])    EKG  EKG Interpretation None       Radiology Dg Chest 2 View  Result Date: 03/13/2017 CLINICAL DATA:  Fever and cough. EXAM: CHEST  2 VIEW COMPARISON:  Chest x-ray dated February 18, 2015. FINDINGS: The heart size and mediastinal contours are within normal limits. Both lungs are clear. The visualized skeletal structures are unremarkable. IMPRESSION: No active cardiopulmonary disease. Electronically Signed   By: Obie Dredge M.D.   On: 03/13/2017 16:49    Procedures Procedures (including critical care time)  Medications Ordered in ED Medications  ibuprofen (ADVIL,MOTRIN) tablet 800 mg (800 mg Oral Given 03/13/17 1600)  lidocaine (XYLOCAINE) 2 % viscous mouth solution 15 mL (15 mLs Mouth/Throat Given 03/13/17 1600)     Initial Impression / Assessment and Plan / ED Course  I have reviewed the triage vital signs and the nursing notes.  Pertinent labs & imaging results that were available  during my care of the patient were reviewed by me and considered in my medical decision making (see chart for details).     Pt with flu like sx with sore throat, strep negative, cxr clear. She was advised rest, increased fluids, ibuprofen, tessalon for sx relief.  Patient was given a dose of viscous lidocaine here for a gargle purposes which she states did not relieve her throat pain.  She was advised to try Cepacol cough lozenges for better throat pain relief.  She is advised that her chest x-ray and her strep test were negative, strep culture is currently pending however.  Plan follow-up with her PCP as needed, returning here for any worsening symptoms.  I suspect she may have viral infection, possibly influenza, especially given her recent close exposure to her sister who  tested positive for influenza just 3 days ago.  Discussed the role of Tamiflu.  Patient defers given lack of funds for obtaining this medication.  Final Clinical Impressions(s) / ED Diagnoses   Final diagnoses:  Influenza-like illness    ED Discharge Orders        Ordered    ibuprofen (ADVIL,MOTRIN) 800 MG tablet  Every 8 hours PRN     03/13/17 1707    benzonatate (TESSALON) 100 MG capsule  3 times daily PRN     03/13/17 1707       Burgess Amor, PA-C 03/13/17 1848    Maia Plan, MD 03/13/17 1920

## 2017-03-13 NOTE — ED Notes (Signed)
Pt taken to xray 

## 2017-03-13 NOTE — ED Triage Notes (Signed)
Pt c/o sore throat and bodyaches and fever x 2 days. Pt tearful. Sister tested positive for flu 3 days ago. Pt sounds like throat is swollen.

## 2017-03-13 NOTE — Discharge Instructions (Signed)
Rest to make sure you are drinking plenty of fluids.  You may use the medications prescribed to help you with your body aches, sore throat and cough.  I suspect you may have flu as discussed, especially given your recent exposure to your sister.  Get rechecked for any worsening symptoms including shortness of breath, uncontrolled fevers or weakness.

## 2017-03-16 LAB — CULTURE, GROUP A STREP (THRC)

## 2017-04-26 ENCOUNTER — Other Ambulatory Visit: Payer: Self-pay

## 2017-04-26 ENCOUNTER — Encounter (HOSPITAL_COMMUNITY): Payer: Self-pay | Admitting: Emergency Medicine

## 2017-04-26 ENCOUNTER — Emergency Department (HOSPITAL_COMMUNITY): Payer: Self-pay

## 2017-04-26 ENCOUNTER — Emergency Department (HOSPITAL_COMMUNITY)
Admission: EM | Admit: 2017-04-26 | Discharge: 2017-04-26 | Disposition: A | Discharge status: Home / Self Care | Payer: Self-pay | Attending: Emergency Medicine | Admitting: Emergency Medicine

## 2017-04-26 DIAGNOSIS — M25552 Pain in left hip: Secondary | ICD-10-CM | POA: Insufficient documentation

## 2017-04-26 DIAGNOSIS — Z87891 Personal history of nicotine dependence: Secondary | ICD-10-CM | POA: Insufficient documentation

## 2017-04-26 DIAGNOSIS — Z79899 Other long term (current) drug therapy: Secondary | ICD-10-CM | POA: Insufficient documentation

## 2017-04-26 DIAGNOSIS — I10 Essential (primary) hypertension: Secondary | ICD-10-CM | POA: Insufficient documentation

## 2017-04-26 MED ORDER — METHOCARBAMOL 500 MG PO TABS: 500.0000 mg | ORAL_TABLET | Freq: Two times a day (BID) | ORAL | 0 refills | Status: DC

## 2017-04-26 MED ORDER — OXYCODONE-ACETAMINOPHEN 5-325 MG PO TABS
2.0000 | ORAL_TABLET | Freq: Once | ORAL | Status: AC
  Administered 2017-04-26: 2 via ORAL
  Filled 2017-04-26: qty 2

## 2017-04-26 MED ORDER — PREDNISONE 50 MG PO TABS
60.0000 mg | ORAL_TABLET | Freq: Once | ORAL | Status: AC
  Administered 2017-04-26: 60 mg via ORAL
  Filled 2017-04-26: qty 1

## 2017-04-26 MED ORDER — LIDOCAINE 5 % EX PTCH: 1.0000 | MEDICATED_PATCH | CUTANEOUS | 0 refills | Status: DC

## 2017-04-26 MED ORDER — HYDROCODONE-ACETAMINOPHEN 5-325 MG PO TABS: 1.0000 | ORAL_TABLET | ORAL | 0 refills | Status: DC | PRN

## 2017-04-26 NOTE — ED Notes (Signed)
To radiology

## 2017-04-26 NOTE — ED Notes (Signed)
Pt speaks of L hip pain for the last 6 weeks  When still pain free, when moving pain is intense and debilitating  Ortho cannot seen her for 3 more weeks

## 2017-04-26 NOTE — ED Triage Notes (Signed)
PT c/o left hip pain with ambulation x45 days unrelieved by prescribed Mobic and tylenol.

## 2017-04-26 NOTE — Discharge Instructions (Signed)
Take it easy, but do not lay around too much as this may make any stiffness worse.  Antiinflammatory medications: Take 600 mg of ibuprofen every 6 hours or 440 mg (over the counter dose) to 500 mg (prescription dose) of naproxen every 12 hours for the next 3 days. After this time, these medications may be used as needed for pain. Take these medications with food to avoid upset stomach. Choose only one of these medications, do not take them together.  Do not take these at the same time as taking the Mobic.  Mobic may be taken instead. Tylenol: Should you continue to have additional pain while taking the ibuprofen or naproxen, you may add in tylenol as needed. Your daily total maximum amount of tylenol from all sources should be limited to 4000mg /day for persons without liver problems, or 2000mg /day for those with liver problems. Vicodin: May take Vicodin as needed for severe pain.  Do not drive or perform other dangerous activities while taking the Vicodin.  Please note that each pill of Vicodin contains 325 mg of Tylenol and the above dosage limits apply. Muscle relaxer: Robaxin is a muscle relaxer and may help loosen stiff muscles. Do not take the Robaxin while driving or performing other dangerous activities.  Lidocaine patches: These are available via either prescription or over-the-counter. The over-the-counter option may be more economical one and are likely just as effective. There are multiple over-the-counter brands, such as Salonpas. Exercises: Be sure to perform the attached exercises starting with three times a week and working up to performing them daily. This is an essential part of preventing long term problems.   Follow up with the orthopedic specialist as soon as possible on this matter for any future management of these complaints.

## 2017-04-26 NOTE — ED Provider Notes (Signed)
[facility] EMERGENCY DEPARTMENT Provider Note   CSN: [id number] Arrival date & time: 04/26/17  1022     History   Chief Complaint Chief Complaint  Patient presents with  . Hip Pain    HPI Donna Long is a 47 y.o. female.  HPI   Donna Long is a 47 y.o. female, with a history of anemia, chronic back pain, GERD, HTN, and SVT, presenting to the ED with left hip pain for the past 2 months.  Pain seems to only be present with walking.  At that time pain is sharp, 7/10, radiating in the lateral and anterior hip.  She has chronic back pain that has been persistent during this time.  She feels as though her left leg is weaker, but she also states she does not want to move it because of pain.  She is taking Tylenol and Mobic.  She has an appointment with her orthopedist, Dr. Ophelia Charter, in 3 weeks.  She denies trauma/falls, numbness, changes in bowel or bladder function, saddle anesthesias, urinary complaints, fever/chills, N/V/D, abdominal pain, or any other complaints.   Past Medical History:  Diagnosis Date  . Anemia   . Anxiety   . Back injury   . Depression   . GERD (gastroesophageal reflux disease)   . Hypertension   . SVT (supraventricular tachycardia) [facility])     Patient Active Problem List   Diagnosis Date Noted  . Anemia 11/04/2016  . Anxiety 11/04/2016    Past Surgical History:  Procedure Laterality Date  . TUBAL LIGATION    . WRIST SURGERY Right     OB History   None      Home Medications    Prior to Admission medications   Medication Sig Start Date End Date Taking? Authorizing Provider  acetaminophen (TYLENOL) 500 MG tablet Take 500 mg by mouth 2 (two) times daily as needed (pain).    Yes [provider]  IRON PO Take by mouth daily.    Yes [provider]  meloxicam (MOBIC) 15 MG tablet Take 15 mg by mouth daily.   Yes [provider]  benzonatate (TESSALON) 100 MG capsule Take 2 capsules (200 mg total) by mouth 3 (three) times  daily as needed. Patient not taking: Reported on 04/26/2017 03/13/17   Burgess Amor, PA-C  HYDROcodone-acetaminophen (NORCO/VICODIN) 5-325 MG tablet Take 1-2 tablets by mouth every 4 (four) hours as needed for severe pain. 04/26/17   Corday Wyka C, PA-C  ibuprofen (ADVIL,MOTRIN) 800 MG tablet Take 1 tablet (800 mg total) by mouth every 8 (eight) hours as needed for fever or moderate pain. 03/13/17   Burgess Amor, PA-C  lidocaine (LIDODERM) 5 % Place 1 patch onto the skin daily. Remove & Discard patch within 12 hours or as directed by MD 04/26/17   Saim Almanza C, PA-C  methocarbamol (ROBAXIN) 500 MG tablet Take 1 tablet (500 mg total) by mouth 2 (two) times daily. 04/26/17   Aemilia Dedrick C, PA-C  metoprolol succinate (TOPROL-XL) 50 MG 24 hr tablet Take 1 tablet (50 mg total) by mouth daily. Take with or immediately following a meal. 12/06/16 03/06/17  Jacquelin Hawking, PA-C    Family History Family History  Problem Relation Age of Onset  . Hypertension Mother   . Heart disease Mother   . COPD Father   . Hypertension Maternal Aunt   . Diabetes Maternal Grandmother     Social History Social History   Tobacco Use  . Smoking status: Former Smoker  Packs/day: 1.00    Years: 33.00    Pack years: 33.00    Types: Cigarettes    Last attempt to quit: 04/02/2008    Years since quitting: 9.0  . Smokeless tobacco: Never Used  Substance Use Topics  . Alcohol use: No  . Drug use: No     Allergies   Clindamycin/lincomycin   Review of Systems Review of Systems  Constitutional: Negative for chills and fever.  Gastrointestinal: Negative for abdominal pain, diarrhea, nausea and vomiting.  Genitourinary: Negative for difficulty urinating, dysuria, frequency and hematuria.  Musculoskeletal: Positive for arthralgias.  Neurological: Negative for numbness.  All other systems reviewed and are negative.    Physical Exam Updated Vital Signs BP (!) 155/88 (BP Location: Right Arm)   Pulse (!) 59   Temp 98  F (36.7 C) (Oral)   Resp 18   Ht 5' 6.5" (1.689 m)   Wt 127 kg (280 lb)   LMP 04/21/2017   SpO2 100%   BMI 44.52 kg/m   Physical Exam  Constitutional: She appears well-developed and well-nourished. No distress.  HENT:  Head: Normocephalic and atraumatic.  Eyes: Conjunctivae are normal.  Neck: Neck supple.  Cardiovascular: Normal rate, regular rhythm and intact distal pulses.  Pulmonary/Chest: Effort normal. No respiratory distress.  Abdominal: Soft. There is no tenderness. There is no guarding.  Musculoskeletal: She exhibits tenderness. She exhibits no edema.  Tenderness throughout the left lower back, buttocks, and anterior hip.  No noted swelling, crepitus, instability, deformity, skin lesions, erythema, or increased warmth. Range of motion in the left hip is quite painful, but intact.  Lymphadenopathy:    She has no cervical adenopathy.  Neurological: She is alert.  Pain elicited with any flexion of the left hip seems to mimic weakness during initial assessment.  Therefore, initial strength was assessed at 3/5 with hip flexion, but 5/5 in the right hip, bilateral knees, and bilateral ankles. No acute sensory deficits.  Initial gait was slow and limping with very hesitant movements in the left leg.  However, patient was able to ambulate on her own and there was no foot drop.  Skin: Skin is warm and dry. She is not diaphoretic.  Psychiatric: She has a normal mood and affect. Her behavior is normal.  Nursing note and vitals reviewed.    ED Treatments / Results  Labs (all labs ordered are listed, but only abnormal results are displayed) Labs Reviewed - No data to display  EKG  EKG Interpretation None       Radiology Dg Lumbar Spine Complete  Result Date: 04/26/2017 CLINICAL DATA:  Chronic low back pain with bilateral sciatic symptoms. EXAM: LUMBAR SPINE - COMPLETE 4+ VIEW COMPARISON:  MRI of the lumbar spine of February 06, 2017 and coronal and sagittal reconstructed  images through the lumbar spine from a CT scan of March 19, 2016 FINDINGS: The lumbar vertebral bodies are preserved in height. There is mild disc space narrowing at L3-4 and L4-5. There is no spondylolisthesis. There is no significant facet joint hypertrophy. The pedicles and transverse processes are intact. The observed portions of the sacrum are normal. IMPRESSION: Mild degenerative disc space narrowing at L3-4 and L4-5. The patient has known protruding disc at this level from the December 2018 MRI. No acute bony abnormality. Electronically Signed   By: David  [place] M.D.   On: 04/26/2017 12:48   Dg Hip Unilat W Or Wo Pelvis 2-3 Views Left  Result Date: 04/26/2017 CLINICAL DATA:  Low back and  left hip discomfort. EXAM: DG HIP (WITH OR WITHOUT PELVIS) 2-3V LEFT COMPARISON:  Coronal and sagittal reconstructed images through the pelvis and left hip from a CT scan of March 19, 2016 FINDINGS: The bony pelvis is subjectively adequately mineralized. There is no lytic or blastic lesion. There is no acute or old fracture. AP and lateral views of the left hip reveal preservation of the joint space. The articular surfaces of the femoral head and acetabulum remain smoothly rounded. IMPRESSION: There is no acute or significant chronic bony abnormality of the left hip. Electronically Signed   By: David  [place]azilandJordan M.D.   On: 04/26/2017 12:49    Procedures Procedures (including critical care time)  Medications Ordered in ED Medications  oxyCODONE-acetaminophen (PERCOCET/ROXICET) 5-325 MG per tablet 2 tablet (2 tablets Oral Given 04/26/17 1153)  predniSONE (DELTASONE) tablet 60 mg (60 mg Oral Given 04/26/17 1154)     Initial Impression / Assessment and Plan / ED Course  I have reviewed the triage vital signs and the nursing notes.  Pertinent labs & imaging results that were available during my care of the patient were reviewed by me and considered in my medical decision making (see chart for  details).  Clinical Course as of Apr 27 1057  Wed Apr 26, 2017  1309 Patient states her pain has significantly improved. She is able to raise her leg without assistance.  Hip flexion strength is now 4+/5 as she does have residual pain with hip flexion.  She ambulates with significantly improved limping gait without assistance. No foot drop.    [SJ]    Clinical Course User Index [SJ] Hurshell Dino C, PA-C    Patient presents with persistent left hip pain for the last 2 months.  With pain management, there are no noted focal neuro deficits.  There are no findings that would require emergent MRI at this time.  There are no acute findings noted on today's imaging.  PCP follow-up is recommended as well as her planned follow-up with her orthopedist. The patient was given instructions for home care as well as return precautions. Patient voices understanding of these instructions, accepts the plan, and is comfortable with discharge.  Final Clinical Impressions(s) / ED Diagnoses   Final diagnoses:  Left hip pain    ED Discharge Orders        Ordered    methocarbamol (ROBAXIN) 500 MG tablet  2 times daily     04/26/17 1315    HYDROcodone-acetaminophen (NORCO/VICODIN) 5-325 MG tablet  Every 4 hours PRN     04/26/17 1315    lidocaine (LIDODERM) 5 %  Every 24 hours     04/26/17 1315       Anselm PancoastJoy, Ted Leonhart C, PA-C 04/27/17 1101    Bethann BerkshireZammit, Joseph, MD 04/27/17 2107

## 2017-04-26 NOTE — ED Notes (Signed)
PA in to assess 

## 2017-05-04 ENCOUNTER — Ambulatory Visit (INDEPENDENT_AMBULATORY_CARE_PROVIDER_SITE_OTHER): Payer: Self-pay | Admitting: Orthopaedic Surgery

## 2017-05-04 ENCOUNTER — Encounter (INDEPENDENT_AMBULATORY_CARE_PROVIDER_SITE_OTHER): Payer: Self-pay | Admitting: Orthopaedic Surgery

## 2017-05-04 ENCOUNTER — Other Ambulatory Visit (HOSPITAL_COMMUNITY)
Admission: RE | Admit: 2017-05-04 | Discharge: 2017-05-04 | Disposition: A | Discharge status: Home / Self Care | Payer: No Typology Code available for payment source | Source: Ambulatory Visit | Attending: Orthopaedic Surgery | Admitting: Orthopaedic Surgery

## 2017-05-04 VITALS — BP 136/91 | HR 85 | Ht 66.5 in | Wt 280.0 lb

## 2017-05-04 DIAGNOSIS — R69 Illness, unspecified: Secondary | ICD-10-CM | POA: Insufficient documentation

## 2017-05-04 DIAGNOSIS — M5126 Other intervertebral disc displacement, lumbar region: Secondary | ICD-10-CM

## 2017-05-04 LAB — CBC WITH DIFFERENTIAL/PLATELET
BASOS ABS: 0.1 10*3/uL (ref 0.0–0.1)
BASOS PCT: 1 %
EOS ABS: 0.4 10*3/uL (ref 0.0–0.7)
Eosinophils Relative: 5 %
HCT: 39.1 % (ref 36.0–46.0)
HEMOGLOBIN: 11.6 g/dL — AB (ref 12.0–15.0)
Lymphocytes Relative: 30 %
Lymphs Abs: 2.9 10*3/uL (ref 0.7–4.0)
MCH: 23.3 pg — ABNORMAL LOW (ref 26.0–34.0)
MCHC: 29.7 g/dL — ABNORMAL LOW (ref 30.0–36.0)
MCV: 78.5 fL (ref 78.0–100.0)
Monocytes Absolute: 0.4 10*3/uL (ref 0.1–1.0)
Monocytes Relative: 4 %
NEUTROS ABS: 5.8 10*3/uL (ref 1.7–7.7)
NEUTROS PCT: 60 %
Platelets: 342 10*3/uL (ref 150–400)
RBC: 4.98 MIL/uL (ref 3.87–5.11)
RDW: 21.3 % — AB (ref 11.5–15.5)
WBC: 9.5 10*3/uL (ref 4.0–10.5)

## 2017-05-04 LAB — C-REACTIVE PROTEIN: CRP: 1.7 mg/dL — AB (ref <1.0)

## 2017-05-04 LAB — SEDIMENTATION RATE: SED RATE: 24 mm/h — AB (ref 0–22)

## 2017-05-04 LAB — URIC ACID: URIC ACID, SERUM: 5.3 mg/dL (ref 2.3–6.6)

## 2017-05-04 MED ORDER — PREDNISONE 10 MG (21) PO TBPK: ORAL_TABLET | ORAL | 0 refills | Status: DC

## 2017-05-04 NOTE — Progress Notes (Addendum)
Office Visit Note   Patient: Donna Long           Date of Birth: Feb 07, 1971           MRN: [id number] Visit Date: 05/04/2017              Requested by: Jacquelin HawkingMcElroy, Shannon, PA-C [address] [place], [place] [address] PCP: Jacquelin HawkingMcElroy, Shannon, PA-C   Assessment & Plan: Visit Diagnoses:  1. Protrusion of lumbar intervertebral disc     Plan: We will obtain an arthritis panel.  She would like to have [facility].  Lumbar myelogram CT scan to make sure that in the upright position she does not have a large bulge on the left side causing her pain symptoms.  Continuing to work with difficulty.  I will see her back after the myelogram CT scan.  Follow-Up Instructions: No follow-ups on file.   Orders:  Orders Placed This Encounter  Procedures  . DG Myelogram Lumbar  . CT LUMBAR SPINE W CONTRAST   Meds ordered this encounter  Medications  . predniSONE (STERAPRED UNI-PAK 21 TAB) 10 MG (21) TBPK tablet    Sig: Take as directed. 6,5,4,3,2,1    Dispense:  21 tablet    Refill:  0      Procedures: No procedures performed   Clinical Data: No additional findings.   Subjective: Chief Complaint  Patient presents with  . Lower Back - Pain  . 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  States she has had an episode of falling she has a history of low back pain and has had a previous MRI scan that showed no compressive lesions.  She states her left leg is weak she is taken Mobic which she previously had for [employer]. wrist injury and states it did not help.  She is used Tylenol also ibuprofen in the past.  Previous lumbar x-rays demonstrate some mild degenerative narrowing at L3-4 and L4-5 without acute fracture.  MRI scan lumbar, 02/06/2017 showed mild disc protrusion without stenosis.  Ligamentum calcification to the right at T12-L1 without compression.   Negative for acute changes.  Patient states the pain is unbearable.  Review of Systems review of systems updated unchanged from 01/26/2017 other than as mentioned in HPI.  She denies chills or fever no associated bowel or bladder symptoms.   Objective: Vital Signs: BP (!) 136/91   Pulse 85   Ht 5' 6.5" (1.689 m)   Wt 280 lb (127 kg)   LMP 04/21/2017   BMI 44.52 kg/m   Physical Exam  Constitutional: She is oriented to person, place, and time. She appears well-developed.  HENT:  Head: Normocephalic.  Right Ear: External ear normal.  Left Ear: External ear normal.  Eyes: Pupils are equal, round, and reactive to light.  Neck: No tracheal deviation present. No thyromegaly present.  Cardiovascular: Normal rate.  Pulmonary/Chest: Effort normal.  Abdominal: Soft.  Obesity  Neurological: She is alert and oriented to person, place, and time.  Skin: Skin is warm and dry.  Psychiatric: She has a normal mood and affect. Her behavior is normal.    Ortho Exam negative logroll both hips.  She has difficulty reaching down to her ankle.  She has some sciatic notch tenderness minimal trochanteric bursal tenderness.  Knee and ankle jerk are intact.  Anterior tib gastrocsoleus  is strong.  Patient has difficulty getting from sitting to standing ambulates with an antalgic gait which actually appears more like a knee limp then related to back pain or hip pain.  No hip flexion contracture.  Knee and ankle jerk are symmetrical.  Distal pulses are intact.  Negative Homan.  No calf tenderness.  Specialty Comments:  No specialty comments available.  Imaging: No results found.   PMFS History: Patient Active Problem List   Diagnosis Date Noted  . 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 Anemia 11/04/2016  . Anxiety 11/04/2016   Past Medical History:  Diagnosis Date  . Anemia   . Anxiety   . Back injury   . Depression   . GERD (gastroesophageal reflux disease)   . Hypertension     . SVT (supraventricular tachycardia) (HCC)     Family History  Problem Relation Age of Onset  . Hypertension Mother   . Heart disease Mother   . COPD Father   . Hypertension Maternal Aunt   . Diabetes Maternal Grandmother     Past Surgical History:  Procedure Laterality Date  . TUBAL LIGATION    . WRIST SURGERY Right    Social History   Occupational History  . Not on file  Tobacco Use  . Smoking status: Former Smoker    Packs/day: 1.00    Years: 33.00    Pack years: 33.00    Types: Cigarettes    Last attempt to quit: 04/02/2008    Years since quitting: 9.0  . Smokeless tobacco: Never Used  Substance and Sexual Activity  . Alcohol use: No  . Drug use: No  . Sexual activity: Not Currently    Birth control/protection: Surgical

## 2017-05-05 ENCOUNTER — Telehealth (INDEPENDENT_AMBULATORY_CARE_PROVIDER_SITE_OTHER): Payer: Self-pay | Admitting: Orthopaedic Surgery

## 2017-05-05 LAB — ANA W/REFLEX IF POSITIVE: Anti Nuclear Antibody(ANA): NEGATIVE

## 2017-05-05 LAB — RHEUMATOID FACTOR: Rhuematoid fact SerPl-aCnc: <10 IU/mL (ref 0.0–13.9)

## 2017-05-05 NOTE — Telephone Encounter (Signed)
Patient had an appt yesterday with Dr. Ophelia CharterYates in [place] and had blood work done. She is calling for her results. Please advise # [phone]

## 2017-05-09 NOTE — Telephone Encounter (Signed)
Can you advise on lab work or hold for Dr. Ophelia CharterYates?  Labs are in chart.

## 2017-05-12 NOTE — Telephone Encounter (Signed)
I called patient and advised. 

## 2017-05-12 NOTE — Telephone Encounter (Signed)
[facility]. Labs were OK

## 2017-05-12 NOTE — Telephone Encounter (Signed)
Can you advise on lab work? Patient is scheduled for lumbar myelogram on 05/23/17.

## 2017-05-12 NOTE — Telephone Encounter (Signed)
Follow-up appointment with Dr. Ophelia CharterYates to review

## 2017-05-15 ENCOUNTER — Ambulatory Visit (HOSPITAL_COMMUNITY): Payer: No Typology Code available for payment source

## 2017-05-15 ENCOUNTER — Ambulatory Visit (HOSPITAL_COMMUNITY): Admission: RE | Admit: 2017-05-15 | Payer: No Typology Code available for payment source | Source: Ambulatory Visit

## 2017-05-23 ENCOUNTER — Ambulatory Visit
Admission: RE | Admit: 2017-05-23 | Discharge: 2017-05-23 | Disposition: A | Discharge status: Home / Self Care | Payer: No Typology Code available for payment source | Source: Ambulatory Visit | Attending: Orthopaedic Surgery | Admitting: Orthopaedic Surgery

## 2017-05-23 DIAGNOSIS — M5126 Other intervertebral disc displacement, lumbar region: Secondary | ICD-10-CM

## 2017-05-23 MED ORDER — IOPAMIDOL (ISOVUE-M 200) INJECTION 41%
15.0000 mL | Freq: Once | INTRAMUSCULAR | Status: AC
  Administered 2017-05-23: 15 mL via INTRATHECAL

## 2017-05-23 MED ORDER — DIAZEPAM 5 MG PO TABS
10.0000 mg | ORAL_TABLET | Freq: Once | ORAL | Status: AC
  Administered 2017-05-23: 10 mg via ORAL

## 2017-05-23 NOTE — Discharge Instructions (Signed)

## 2017-05-25 ENCOUNTER — Encounter (INDEPENDENT_AMBULATORY_CARE_PROVIDER_SITE_OTHER): Payer: Self-pay | Admitting: Orthopaedic Surgery

## 2017-05-25 ENCOUNTER — Ambulatory Visit (INDEPENDENT_AMBULATORY_CARE_PROVIDER_SITE_OTHER): Payer: Self-pay | Admitting: Orthopaedic Surgery

## 2017-05-25 VITALS — BP 141/89 | HR 61 | Ht 66.5 in | Wt 285.0 lb

## 2017-05-25 DIAGNOSIS — M5126 Other intervertebral disc displacement, lumbar region: Secondary | ICD-10-CM

## 2017-05-25 NOTE — Progress Notes (Addendum)
Office Visit Note   Patient: Donna Long           Date of Birth: 07/15/1970           MRN: [id number] Visit Date: 05/25/2017              Requested by: Jacquelin Hawking, PA-C [address] [place], [place] [address] PCP: Jacquelin Hawking, PA-C   Assessment & Plan: Visit Diagnoses:  1. Protrusion of lumbar intervertebral disc     Plan: There is disc bulge at L3-4 with some dynamic instability at 1.5 mm shifting on flexion extension.  Discussed weight loss with her BMI of 45 to help unload her back facet problem that she has.  In the future if she starts more unstable at that level with an operative intervention will be considered.  Currently she does not have any compressive lesions.  MRI gave her a copy of the report.  Dressed to go to the gym today and that is where they are heading for this office visit.  I gave her a prescription for dietary referral for weight loss that she can consider obtaining an [place].  She will work on weight loss to help unload her back discussed multiple exercises that she should avoid and discussed exercises that should not bother her back problem follow-up as needed.  Goal is weight loss that she can unload her back and take care of her symptoms.  Follow-Up Instructions: Return if symptoms worsen or fail to improve.   Orders:  No orders of the defined types were placed in this encounter.  No orders of the defined types were placed in this encounter.     Procedures: No procedures performed   Clinical Data: No additional findings.   Subjective: Chief Complaint  Patient presents with  . 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  Tylenol and ibuprofen.  MRI 02/06/2017 showed mild disc protrusion without stenosis and  Is   Available for review.  Review of Systems positive for anxiety anemia, obesity,  morbid BMI 45.  Previous D&C July 2019.   Objective: Vital Signs: BP (!) 141/89   Pulse 61   Ht 5' 6.5" (1.689 m)   Wt 285 lb (129.3 kg)   BMI 45.31 kg/m   Physical Exam  Constitutional: She is oriented to person, place, and time. She appears well-developed.  HENT:  Head: Normocephalic.  Right Ear: External ear normal.  Left Ear: External ear normal.  Eyes: Pupils are equal, round, and reactive to light.  Neck: No tracheal deviation present. No thyromegaly present.  Cardiovascular: Normal rate.  Pulmonary/Chest: Effort normal.  Abdominal: Soft.  Neurological: She is alert and oriented to person, place, and time.  Skin: Skin is warm and dry.  Psychiatric: She has a normal mood and affect. Her behavior is normal.    Ortho Exam my.  Negative logroll the hips.  Mild trochanteric tenderness some sciatic notch tenderness on the left minimal on the right.  [place] is strong.  Specialty Comments:  No specialty comments available.  Imaging: CLINICAL DATA:  Low back pain.  LEFT groin pain.  EXAM: LUMBAR MYELOGRAM  FLUOROSCOPY TIME:  41 seconds corresponding to a Dose Area Product of 420.41 Gy*m2  PROCEDURE: After thorough discussion of risks and benefits of the procedure including bleeding, infection, injury to nerves, blood vessels, adjacent  structures as well as headache and CSF leak, written and oral informed consent was obtained. Consent was obtained by Dr. Davonna Belling. Time out form was completed.  Patient was positioned prone on the fluoroscopy table. Local anesthesia was provided with 1% lidocaine without epinephrine after prepped and draped in the usual sterile fashion. Puncture was performed at L3-L4 using a 5 inch 22-gauge spinal needle via LEFT paramedian approach. Using a single pass through the dura, the needle was placed within the thecal sac, with return of clear CSF. 15 mL of Isovue M-200 was injected into the thecal sac, with normal opacification of the  nerve roots and cauda equina consistent with free flow within the subarachnoid space.  I personally performed the lumbar puncture and administered the intrathecal contrast. I also personally supervised acquisition of the myelogram images.  TECHNIQUE: Contiguous axial images were obtained through the Lumbar spine after the intrathecal infusion of infusion. Coronal and sagittal reconstructions were obtained of the axial image sets.  COMPARISON:  MRI lumbar spine 02/06/2017.  FINDINGS: LUMBAR MYELOGRAM FINDINGS:  Good opacification lumbar subarachnoid space. No nerve root cut off or spinal stenosis.  With patient standing, 1 mm of anterolisthesis develops at L3-L4 with patient in neutral. In flexion, this increases to 2 mm. In extension, 1 mm anterolisthesis is observed.  CT LUMBAR MYELOGRAM FINDINGS:  Segmentation: Normal.  Alignment:  Normal except for 1 mm anterolisthesis L3-4.  Vertebrae: No worrisome osseous lesion.  Conus medullaris: Normal in size and location.  Paraspinal tissues: No evidence for hydronephrosis or paravertebral mass.  Disc levels:  T12-L1: Normal disc space. Facet arthropathy, worse on the RIGHT. There is a small triangular fragment of bone, projecting into the RIGHT subarticular zone, which appears to represent an ununited inferior articular process fracture. There is foraminal zone narrowing which could affect the RIGHT T12 nerve root. No conus compression, or evidence for RIGHT L1 nerve root impingement.  L1-L2:  Normal.  L2-L3:  Normal.  L3-L4: 1 mm anterolisthesis. Annular bulge. Mild facet arthropathy. No impingement.  L4-L5: Annular bulge. Mild facet arthropathy. Normal alignment. No impingement.  L5-S1:  Normal disc space.  Facet arthropathy.  No impingement.  IMPRESSION: LUMBAR MYELOGRAM IMPRESSION:  Lumbar myelogram demonstrating no nerve root cut off or spinal stenosis. Slight dynamic instability at  L3-4, up to 2 mm in standing flexion.  CT LUMBAR MYELOGRAM IMPRESSION:  Noncompressive facet arthropathy leading to 1 mm anterolisthesis L3-4. No subarticular zone or foraminal zone narrowing.  Asymmetric facet arthropathy at T12-L1 on the RIGHT, suspected old injury. Foraminal zone narrowing which could affect the RIGHT T12 nerve root.  No clear abnormality is detected contributing to LEFT groin pain.   Electronically Signed   By: Elsie Stain M.D.   On: 05/23/2017 11:48   PMFS History: Patient Active Problem List   Diagnosis Date Noted  . 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 Anemia 11/04/2016  . Anxiety 11/04/2016   Past Medical History:  Diagnosis Date  . Anemia   . Anxiety   . Back injury   . Depression   . GERD (gastroesophageal reflux disease)   . Hypertension   . SVT (supraventricular tachycardia) (HCC)     Family History  Problem Relation Age of Onset  . Hypertension Mother   . Heart disease Mother   . COPD Father   . Hypertension Maternal Aunt   . Diabetes Maternal Grandmother     Past Surgical History:  Procedure Laterality Date  . TUBAL LIGATION    .  WRIST SURGERY Right    Social History   Occupational History  . Not on file  Tobacco Use  . Smoking status: Former Smoker    Packs/day: 1.00    Years: 33.00    Pack years: 33.00    Types: Cigarettes    Last attempt to quit: 04/02/2008    Years since quitting: 9.1  . Smokeless tobacco: Never Used  Substance and Sexual Activity  . Alcohol use: No  . Drug use: No  . Sexual activity: Not Currently    Birth control/protection: Surgical

## 2017-06-27 ENCOUNTER — Encounter (HOSPITAL_COMMUNITY): Payer: Self-pay | Admitting: *Deleted

## 2017-06-27 ENCOUNTER — Other Ambulatory Visit: Payer: Self-pay

## 2017-06-27 ENCOUNTER — Emergency Department (HOSPITAL_COMMUNITY)
Admission: EM | Admit: 2017-06-27 | Discharge: 2017-06-27 | Disposition: A | Discharge status: Home / Self Care | Payer: Self-pay | Attending: Emergency Medicine | Admitting: Emergency Medicine

## 2017-06-27 DIAGNOSIS — N938 Other specified abnormal uterine and vaginal bleeding: Secondary | ICD-10-CM | POA: Insufficient documentation

## 2017-06-27 DIAGNOSIS — N939 Abnormal uterine and vaginal bleeding, unspecified: Secondary | ICD-10-CM

## 2017-06-27 DIAGNOSIS — I10 Essential (primary) hypertension: Secondary | ICD-10-CM | POA: Insufficient documentation

## 2017-06-27 DIAGNOSIS — Z79899 Other long term (current) drug therapy: Secondary | ICD-10-CM | POA: Insufficient documentation

## 2017-06-27 DIAGNOSIS — Z87891 Personal history of nicotine dependence: Secondary | ICD-10-CM | POA: Insufficient documentation

## 2017-06-27 LAB — COMPREHENSIVE METABOLIC PANEL
ALBUMIN: 3.4 g/dL — AB (ref 3.5–5.0)
ALK PHOS: 98 U/L (ref 38–126)
ALT: 22 U/L (ref 14–54)
AST: 20 U/L (ref 15–41)
Anion gap: 9 (ref 5–15)
BILIRUBIN TOTAL: 0.6 mg/dL (ref 0.3–1.2)
BUN: 12 mg/dL (ref 6–20)
CALCIUM: 8.8 mg/dL — AB (ref 8.9–10.3)
CO2: 26 mmol/L (ref 22–32)
Chloride: 104 mmol/L (ref 101–111)
Creatinine, Ser: 0.6 mg/dL (ref 0.44–1.00)
GFR calc Af Amer: >60 mL/min (ref >60)
GLUCOSE: 92 mg/dL (ref 65–99)
POTASSIUM: 3.5 mmol/L (ref 3.5–5.1)
Sodium: 139 mmol/L (ref 135–145)
TOTAL PROTEIN: 6.9 g/dL (ref 6.5–8.1)

## 2017-06-27 LAB — CBC WITH DIFFERENTIAL/PLATELET
BASOS PCT: 1 %
Basophils Absolute: 0.1 10*3/uL (ref 0.0–0.1)
Eosinophils Absolute: 0.2 10*3/uL (ref 0.0–0.7)
Eosinophils Relative: 3 %
HEMATOCRIT: 38 % (ref 36.0–46.0)
HEMOGLOBIN: 11.7 g/dL — AB (ref 12.0–15.0)
LYMPHS PCT: 29 %
Lymphs Abs: 2.3 10*3/uL (ref 0.7–4.0)
MCH: 24.8 pg — ABNORMAL LOW (ref 26.0–34.0)
MCHC: 30.8 g/dL (ref 30.0–36.0)
MCV: 80.5 fL (ref 78.0–100.0)
MONO ABS: 0.6 10*3/uL (ref 0.1–1.0)
MONOS PCT: 7 %
NEUTROS ABS: 4.7 10*3/uL (ref 1.7–7.7)
NEUTROS PCT: 60 %
Platelets: 328 10*3/uL (ref 150–400)
RBC: 4.72 MIL/uL (ref 3.87–5.11)
RDW: 20.3 % — AB (ref 11.5–15.5)
WBC: 7.9 10*3/uL (ref 4.0–10.5)

## 2017-06-27 LAB — URINALYSIS, ROUTINE W REFLEX MICROSCOPIC
Bacteria, UA: NONE SEEN
Bilirubin Urine: NEGATIVE
GLUCOSE, UA: NEGATIVE mg/dL
Ketones, ur: NEGATIVE mg/dL
Leukocytes, UA: NEGATIVE
Nitrite: NEGATIVE
PH: 5 (ref 5.0–8.0)
Protein, ur: NEGATIVE mg/dL
SPECIFIC GRAVITY, URINE: 1.024 (ref 1.005–1.030)

## 2017-06-27 LAB — I-STAT BETA HCG BLOOD, ED (MC, WL, AP ONLY)

## 2017-06-27 LAB — PREGNANCY, URINE: Preg Test, Ur: NEGATIVE

## 2017-06-27 MED ORDER — ESTROGENS CONJUGATED 1.25 MG PO TABS: 2.5000 mg | ORAL_TABLET | Freq: Every day | ORAL | 0 refills | Status: DC

## 2017-06-27 MED ORDER — SODIUM CHLORIDE 0.9 % IV BOLUS
500.0000 mL | Freq: Once | INTRAVENOUS | Status: AC
  Administered 2017-06-27: 500 mL via INTRAVENOUS

## 2017-06-27 NOTE — ED Notes (Signed)
Pt called requesting premarin prescription be changed to something cheaper.  [facility] pharmacist on phone with Dr. Rosalia Hammers.

## 2017-06-27 NOTE — ED Notes (Signed)
Pelvic exam performed by Dr Rosalia Hammers, pt tolerated well,

## 2017-06-27 NOTE — ED Notes (Signed)
ED Provider at bedside. 

## 2017-06-27 NOTE — ED Provider Notes (Signed)
[facility] EMERGENCY DEPARTMENT Provider Note   CSN: [id number] Arrival date & time: 06/27/17  0347     History   Chief Complaint Chief Complaint  Patient presents with  . Vaginal Bleeding    HPI Donna Long is a 47 y.o. female.  HPI  47 yo female ho vaginal bleeding for years.  States she has monthly menses that last up to week and has bled up to 30 days.  She described intermittent pad use up to 5 minutes.  She denies lightheadedness, chest pain, nausea,vomiting.  She states she was due to have hysterectomy several years ago but "chickened out."  Past Medical History:  Diagnosis Date  . Anemia   . Anxiety   . Back injury   . Depression   . GERD (gastroesophageal reflux disease)   . Hypertension   . SVT (supraventricular tachycardia) [facility])     Patient Active Problem List   Diagnosis Date Noted  . 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 Anemia 11/04/2016  . Anxiety 11/04/2016    Past Surgical History:  Procedure Laterality Date  . TUBAL LIGATION    . WRIST SURGERY Right      OB History   None      Home Medications    Prior to Admission medications   Medication Sig Start Date End Date Taking? Authorizing Provider  acetaminophen (TYLENOL) 500 MG tablet Take 500 mg by mouth 2 (two) times daily as needed (pain).     [provider]  ibuprofen (ADVIL,MOTRIN) 800 MG tablet Take 1 tablet (800 mg total) by mouth every 8 (eight) hours as needed for fever or moderate pain. 03/13/17   Burgess Amor, PA-C  IRON PO Take 1 tablet by mouth daily.     [provider]  lidocaine (LIDODERM) 5 % Place 1 patch onto the skin daily. Remove & Discard patch within 12 hours or as directed by MD Patient not taking: Reported on 05/10/2017 04/26/17   Joy, Ines Bloomer C, PA-C  metoprolol succinate (TOPROL-XL) 50 MG 24 hr tablet Take 1 tablet (50 mg total) by mouth daily. Take with or immediately following a meal. Patient not taking: Reported on 05/10/2017 12/06/16  03/06/17  Jacquelin Hawking, PA-C    Family History Family History  Problem Relation Age of Onset  . Hypertension Mother   . Heart disease Mother   . COPD Father   . Hypertension Maternal Aunt   . Diabetes Maternal Grandmother     Social History Social History   Tobacco Use  . Smoking status: Former Smoker    Packs/day: 1.00    Years: 33.00    Pack years: 33.00    Types: Cigarettes    Last attempt to quit: 04/02/2008    Years since quitting: 9.2  . Smokeless tobacco: Never Used  Substance Use Topics  . Alcohol use: No  . Drug use: No     Allergies   Clindamycin/lincomycin   Review of Systems Review of Systems  All other systems reviewed and are negative.    Physical Exam Updated Vital Signs BP 126/76 (BP Location: Left Arm)   Pulse 61   Temp 97.8 F (36.6 C) (Oral)   Resp 18   Ht 1.676 m ( )   Wt 127 kg (280 lb)   SpO2 100%   BMI 45.19 kg/m   Physical Exam  Constitutional: She is oriented to person, place, and time. She appears well-developed and well-nourished.  HENT:  Head: Normocephalic and atraumatic.  Right  Ear: External ear normal.  Left Ear: External ear normal.  Eyes: Pupils are equal, round, and reactive to light. EOM are normal.  Neck: Normal range of motion. Neck supple.  Cardiovascular: Normal rate, regular rhythm and normal heart sounds.  Pulmonary/Chest: Effort normal and breath sounds normal.  Abdominal: Soft. Bowel sounds are normal.  Genitourinary:  Genitourinary Comments: Vaginal bleeding with clearing with suction Unable to visualize cervix due to redundant tissue Bimanual cervix palpable No lateralized swelling or tendernss, but some ttp over uterus  Musculoskeletal: Normal range of motion.  Neurological: She is alert and oriented to person, place, and time.  Skin: Skin is warm and dry. Capillary refill takes less than 2 seconds.  Psychiatric: She has a normal mood and affect.  Nursing note and vitals reviewed.    ED  Treatments / Results  Labs (all labs ordered are listed, but only abnormal results are displayed) Labs Reviewed  CBC WITH DIFFERENTIAL/PLATELET - Abnormal; Notable for the following components:      Result Value   Hemoglobin 11.7 (*)    MCH 24.8 (*)    RDW 20.3 (*)    All other components within normal limits  COMPREHENSIVE METABOLIC PANEL - Abnormal; Notable for the following components:   Calcium 8.8 (*)    Albumin 3.4 (*)    All other components within normal limits  URINALYSIS, ROUTINE W REFLEX MICROSCOPIC - Abnormal; Notable for the following components:   Hgb urine dipstick SMALL (*)    All other components within normal limits  PREGNANCY, URINE  I-STAT BETA HCG BLOOD, ED ([facility], [facility], [facility] ONLY)    EKG None  Radiology No results found.  Procedures Procedures (including critical care time)  Medications Ordered in ED Medications  sodium chloride 0.9 % bolus 500 mL (500 mLs Intravenous New Bag/Given 06/27/17 0721)     Initial Impression / Assessment and Plan / ED Course  I have reviewed the triage vital signs and the nursing notes.  Pertinent labs & imaging results that were available during my care of the patient were reviewed by me and considered in my medical decision making (see chart for details).     47 year old female with abnormal uterine bleeding.  She is hemodynamically stable hemoglobin is stable from prior here at 11.7.  She is mildly tender on her bimanual exam but no history of STDs or vaginal discharge.  Patient is morbidly obese and was unable to get a good sense of the size of her uterus.  Placed on the premarin and is referred to GYN for follow-up.  Final Clinical Impressions(s) / ED Diagnoses   Final diagnoses:  Abnormal uterine bleeding    ED Discharge Orders    None       Margarita Grizzle, MD 06/27/17 [phone]

## 2017-06-27 NOTE — ED Triage Notes (Signed)
Pt c/o heavy vaginal bleeding for the past 3 weeks, headaches, weakness, abd and back pain as well

## 2017-06-29 ENCOUNTER — Other Ambulatory Visit (HOSPITAL_COMMUNITY)
Admission: RE | Admit: 2017-06-29 | Discharge: 2017-06-29 | Disposition: A | Discharge status: Home / Self Care | Payer: Self-pay | Source: Ambulatory Visit | Attending: Obstetrics & Gynecology | Admitting: Obstetrics & Gynecology

## 2017-06-29 ENCOUNTER — Other Ambulatory Visit: Payer: Self-pay

## 2017-06-29 ENCOUNTER — Ambulatory Visit (INDEPENDENT_AMBULATORY_CARE_PROVIDER_SITE_OTHER): Payer: Self-pay | Admitting: Obstetrics & Gynecology

## 2017-06-29 ENCOUNTER — Encounter: Payer: Self-pay | Admitting: Obstetrics & Gynecology

## 2017-06-29 VITALS — BP 128/84 | Ht 66.5 in | Wt 288.0 lb

## 2017-06-29 DIAGNOSIS — Z124 Encounter for screening for malignant neoplasm of cervix: Secondary | ICD-10-CM | POA: Insufficient documentation

## 2017-06-29 DIAGNOSIS — N921 Excessive and frequent menstruation with irregular cycle: Secondary | ICD-10-CM

## 2017-06-29 DIAGNOSIS — N946 Dysmenorrhea, unspecified: Secondary | ICD-10-CM

## 2017-06-29 MED ORDER — KETOROLAC TROMETHAMINE 10 MG PO TABS
10.0000 mg | ORAL_TABLET | Freq: Three times a day (TID) | ORAL | 0 refills | Status: DC | PRN
Start: 2017-06-29 — End: 2017-08-09

## 2017-06-29 MED ORDER — MEGESTROL ACETATE 40 MG PO TABS: ORAL_TABLET | ORAL | 3 refills | Status: DC

## 2017-06-29 NOTE — Progress Notes (Signed)
Chief Complaint  Patient presents with  . Follow-up    ED visit-AUB      47 y.o. G4P4 Patient's last menstrual period was 06/08/2017. The current method of family planning is tubal ligation.  Outpatient Encounter Medications as of 06/29/2017  Medication Sig Note  . [DISCONTINUED] acetaminophen (TYLENOL) 500 MG tablet Take 500 mg by mouth 2 (two) times daily as needed (pain).    . [DISCONTINUED] estrogens, conjugated, (PREMARIN) 1.25 MG tablet Take 2 tablets (2.5 mg total) by mouth daily. Take 5 mg daily for 21 days   . [DISCONTINUED] ibuprofen (ADVIL,MOTRIN) 800 MG tablet Take 1 tablet (800 mg total) by mouth every 8 (eight) hours as needed for fever or moderate pain. (Patient not taking: Reported on 08/03/2017)   . [DISCONTINUED] IRON PO Take 1 tablet by mouth daily.    . [DISCONTINUED] ketorolac (TORADOL) 10 MG tablet Take 1 tablet (10 mg total) by mouth every 8 (eight) hours as needed. (Patient not taking: Reported on 07/31/2017)   . [DISCONTINUED] lidocaine (LIDODERM) 5 % Place 1 patch onto the skin daily. Remove & Discard patch within 12 hours or as directed by MD (Patient not taking: Reported on 05/10/2017)   . [DISCONTINUED] megestrol (MEGACE) 40 MG tablet 3 tablets a day for 5 days, 2 tablets a day for 5 days then 1 tablet daily (Patient taking differently: Take 40 mg by mouth daily. ) 08/03/2017: Patient to complete on 08/16/17  . [DISCONTINUED] metoprolol succinate (TOPROL-XL) 50 MG 24 hr tablet Take 1 tablet (50 mg total) by mouth daily. Take with or immediately following a meal. (Patient not taking: Reported on 05/10/2017)    No facility-administered encounter medications on file as of 06/29/2017.     Subjective Donna Long hs several year history of heavy and long menstrual periods 1 week-30 days Can change  Every 10 minutes at times Soils clothes and sheets Past Medical History:  Diagnosis Date  . Anemia   . Anxiety   . Arthritis   . Back injury   . Chronic back pain    . Depression   . Dysrhythmia    HX SVT  . GERD (gastroesophageal reflux disease)   . Hypertension   . SVT (supraventricular tachycardia) (HCC)     Past Surgical History:  Procedure Laterality Date  . DILITATION & CURRETTAGE/HYSTROSCOPY WITH NOVASURE ABLATION N/A 08/16/2017   Procedure: DILATATION & CURETTAGE/HYSTEROSCOPY WITH MINERVA  ENDOMETRIAL ABLATION;  Surgeon: Lazaro Arms, MD;  Location: [facility];  Service: Gynecology;  Laterality: N/A;  . TUBAL LIGATION    . WRIST SURGERY Right     OB History    Gravida  4   Para  4   Term      Preterm      AB      Living  4     SAB      TAB      Ectopic      Multiple      Live Births              Allergies  Allergen Reactions  . Clindamycin/Lincomycin Rash    Social History   Socioeconomic History  . Marital status: Single    Spouse name: Not on file  . Number of children: Not on file  . Years of education: Not on file  . Highest education level: Not on file  Occupational History  . Not on file  Social Needs  . Financial resource strain: Not  on file  . Food insecurity:    Worry: Not on file    Inability: Not on file  . Transportation needs:    Medical: Not on file    Non-medical: Not on file  Tobacco Use  . Smoking status: Former Smoker    Packs/day: 1.00    Years: 33.00    Pack years: 33.00    Types: Cigarettes    Last attempt to quit: 04/02/2008    Years since quitting: 9.5  . Smokeless tobacco: Never Used  Substance and Sexual Activity  . Alcohol use: No  . Drug use: No  . Sexual activity: Not Currently    Birth control/protection: Surgical  Lifestyle  . Physical activity:    Days per week: Not on file    Minutes per session: Not on file  . Stress: Not on file  Relationships  . Social connections:    Talks on phone: Not on file    Gets together: Not on file    Attends religious service: Not on file    Active member of club or organization: Not on file    Attends meetings of  clubs or organizations: Not on file    Relationship status: Not on file  Other Topics Concern  . Not on file  Social History Narrative  . Not on file    Family History  Problem Relation Age of Onset  . Hypertension Mother   . Heart disease Mother   . COPD Father   . Hypertension Maternal Aunt   . Diabetes Maternal Grandmother   . Kidney disease Sister     Medications:       Current Outpatient Medications:  .  hydrochlorothiazide (MICROZIDE) 12.5 MG capsule, Take 1 capsule (12.5 mg total) by mouth daily., Disp: 30 capsule, Rfl: 1  Objective Blood pressure 128/84, height 5' 6.5" (1.689 m), weight 288 lb (130.6 kg), last menstrual period 06/08/2017.  General WDWN female NAD Vulva:  normal appearing vulva with no masses, tenderness or lesions Vagina:  normal mucosa, no discharge Cervix:  no cervical motion tenderness and no lesions Uterus:  normal size, contour, position, consistency, mobility, non-tender Adnexa: ovaries:present,  normal adnexa in size, nontender and no masses   Pertinent ROS No burning with urination, frequency or urgency No nausea, vomiting or diarrhea Nor fever chills or other constitutional symptoms   Labs or studies Reviewed labs from ED visit    Impression Diagnoses this Encounter::   ICD-10-CM   1. Menometrorrhagia N92.1 US PELVIS TRANSVANGINAL NON-OB (TV ONLY)    US PELVIS (TRANSABDOMINAL ONLY)  2. Dysmenorrhea N94.6 US PELVIS TRANSVANGINAL NON-OB (TV ONLY)    US PELVIS (TRANSABDOMINAL ONLY)  3. Routine cervical smear Z12.4 Cytology - PAP    Established relevant diagnosis(es):   Plan/Recommendations: Meds ordered this encounter  Medications  . DISCONTD: megestrol (MEGACE) 40 MG tablet    Sig: 3 tablets a day for 5 days, 2 tablets a day for 5 days then 1 tablet daily    Dispense:  45 tablet    Refill:  3  . DISCONTD: ketorolac (TORADOL) 10 MG tablet    Sig: Take 1 tablet (10 mg total) by mouth every 8 (eight) hours as needed.     Dispense:  15 tablet    Refill:  0    Labs or Scans Ordered: Orders Placed This Encounter  Procedures  . US PELVIS TRANSVANGINAL NON-OB (TV ONLY)  . US PELVIS (TRANSABDOMINAL ONLY)    Management:: Megestrol algorithm + toradol for  dysmenorrhea Sonogram 1 month to evaluate the endometrium Assess response + sonogram in 1 month and make clinical management decision based on that information  Follow up Return in about 1 month (around 07/30/2017) for GYN sono, Follow up, with Dr Despina Hidden.      All questions were answered.

## 2017-07-06 LAB — CYTOLOGY - PAP
Diagnosis: NEGATIVE
HPV (WINDOPATH): NOT DETECTED

## 2017-07-27 ENCOUNTER — Other Ambulatory Visit: Payer: Self-pay | Admitting: *Deleted

## 2017-07-27 ENCOUNTER — Encounter (INDEPENDENT_AMBULATORY_CARE_PROVIDER_SITE_OTHER): Payer: Self-pay

## 2017-07-27 ENCOUNTER — Ambulatory Visit: Payer: Self-pay | Admitting: Obstetrics & Gynecology

## 2017-07-27 DIAGNOSIS — N921 Excessive and frequent menstruation with irregular cycle: Secondary | ICD-10-CM

## 2017-07-27 DIAGNOSIS — N946 Dysmenorrhea, unspecified: Secondary | ICD-10-CM

## 2017-07-28 ENCOUNTER — Ambulatory Visit (HOSPITAL_COMMUNITY)
Admission: RE | Admit: 2017-07-28 | Discharge: 2017-07-28 | Disposition: A | Discharge status: Home / Self Care | Payer: Self-pay | Source: Ambulatory Visit | Attending: Obstetrics & Gynecology | Admitting: Obstetrics & Gynecology

## 2017-07-28 DIAGNOSIS — N921 Excessive and frequent menstruation with irregular cycle: Secondary | ICD-10-CM | POA: Insufficient documentation

## 2017-07-28 DIAGNOSIS — N946 Dysmenorrhea, unspecified: Secondary | ICD-10-CM | POA: Insufficient documentation

## 2017-07-31 ENCOUNTER — Ambulatory Visit (INDEPENDENT_AMBULATORY_CARE_PROVIDER_SITE_OTHER): Payer: Self-pay | Admitting: Obstetrics & Gynecology

## 2017-07-31 ENCOUNTER — Encounter: Payer: Self-pay | Admitting: Obstetrics & Gynecology

## 2017-07-31 VITALS — BP 147/87 | HR 73 | Wt 291.0 lb

## 2017-07-31 DIAGNOSIS — N921 Excessive and frequent menstruation with irregular cycle: Secondary | ICD-10-CM

## 2017-07-31 DIAGNOSIS — N946 Dysmenorrhea, unspecified: Secondary | ICD-10-CM

## 2017-07-31 NOTE — Progress Notes (Signed)
Preoperative History and Physical  Donna RoysLisa Clarke is a 47 y.o. G4P4 with No LMP recorded. admitted for a hysteroscopy uterine curettage minerva endometrial ablation.  Bleeding stopped on megestrol but the cramps have continued, maybe worse  PMH:    Past Medical History:  Diagnosis Date  . Anemia   . Anxiety   . Back injury   . Depression   . GERD (gastroesophageal reflux disease)   . Hypertension   . SVT (supraventricular tachycardia) (HCC)     PSH:     Past Surgical History:  Procedure Laterality Date  . TUBAL LIGATION    . WRIST SURGERY Right     POb/GynH:      OB History    Gravida  4   Para  4   Term      Preterm      AB      Living  4     SAB      TAB      Ectopic      Multiple      Live Births              SH:   Social History   Tobacco Use  . Smoking status: Former Smoker    Packs/day: 1.00    Years: 33.00    Pack years: 33.00    Types: Cigarettes    Last attempt to quit: 04/02/2008    Years since quitting: 9.3  . Smokeless tobacco: Never Used  Substance Use Topics  . Alcohol use: No  . Drug use: No    FH:    Family History  Problem Relation Age of Onset  . Hypertension Mother   . Heart disease Mother   . COPD Father   . Hypertension Maternal Aunt   . Diabetes Maternal Grandmother   . Kidney disease Sister      Allergies:  Allergies  Allergen Reactions  . Clindamycin/Lincomycin Rash    Medications:       Current Outpatient Medications:  .  ibuprofen (ADVIL,MOTRIN) 800 MG tablet, Take 1 tablet (800 mg total) by mouth every 8 (eight) hours as needed for fever or moderate pain., Disp: 21 tablet, Rfl: 0 .  megestrol (MEGACE) 40 MG tablet, 3 tablets a day for 5 days, 2 tablets a day for 5 days then 1 tablet daily, Disp: 45 tablet, Rfl: 3 .  acetaminophen (TYLENOL) 500 MG tablet, Take 500 mg by mouth 2 (two) times daily as needed (pain). , Disp: , Rfl:  .  IRON PO, Take 1 tablet by mouth daily. , Disp: , Rfl:  .  ketorolac  (TORADOL) 10 MG tablet, Take 1 tablet (10 mg total) by mouth every 8 (eight) hours as needed. (Patient not taking: Reported on 07/31/2017), Disp: 15 tablet, Rfl: 0 .  lidocaine (LIDODERM) 5 %, Place 1 patch onto the skin daily. Remove & Discard patch within 12 hours or as directed by MD (Patient not taking: Reported on 05/10/2017), Disp: 30 patch, Rfl: 0 .  metoprolol succinate (TOPROL-XL) 50 MG 24 hr tablet, Take 1 tablet (50 mg total) by mouth daily. Take with or immediately following a meal. (Patient not taking: Reported on 05/10/2017), Disp: 30 tablet, Rfl: 0  Review of Systems:   Review of Systems  Constitutional: Negative for fever, chills, weight loss, malaise/fatigue and diaphoresis.  HENT: Negative for hearing loss, ear pain, nosebleeds, congestion, sore throat, neck pain, tinnitus and ear discharge.   Eyes: Negative for blurred vision, double vision, photophobia,  pain, discharge and redness.  Respiratory: Negative for cough, hemoptysis, sputum production, shortness of breath, wheezing and stridor.   Cardiovascular: Negative for chest pain, palpitations, orthopnea, claudication, leg swelling and PND.  Gastrointestinal: Positive for abdominal pain. Negative for heartburn, nausea, vomiting, diarrhea, constipation, blood in stool and melena.  Genitourinary: Negative for dysuria, urgency, frequency, hematuria and flank pain.  Musculoskeletal: Negative for myalgias, back pain, joint pain and falls.  Skin: Negative for itching and rash.  Neurological: Negative for dizziness, tingling, tremors, sensory change, speech change, focal weakness, seizures, loss of consciousness, weakness and headaches.  Endo/Heme/Allergies: Negative for environmental allergies and polydipsia. Does not bruise/bleed easily.  Psychiatric/Behavioral: Negative for depression, suicidal ideas, hallucinations, memory loss and substance abuse. The patient is not nervous/anxious and does not have insomnia.      PHYSICAL  EXAM:  Blood pressure (!) 147/87, pulse 73, weight 291 lb (132 kg).    Vitals reviewed. Constitutional: She is oriented to person, place, and time. She appears well-developed and well-nourished.  HENT:  Head: Normocephalic and atraumatic.  Right Ear: External ear normal.  Left Ear: External ear normal.  Nose: Nose normal.  Mouth/Throat: Oropharynx is clear and moist.  Eyes: Conjunctivae and EOM are normal. Pupils are equal, round, and reactive to light. Right eye exhibits no discharge. Left eye exhibits no discharge. No scleral icterus.  Neck: Normal range of motion. Neck supple. No tracheal deviation present. No thyromegaly present.  Cardiovascular: Normal rate, regular rhythm, normal heart sounds and intact distal pulses.  Exam reveals no gallop and no friction rub.   No murmur heard. Respiratory: Effort normal and breath sounds normal. No respiratory distress. She has no wheezes. She has no rales. She exhibits no tenderness.  GI: Soft. Bowel sounds are normal. She exhibits no distension and no mass. There is tenderness. There is no rebound and no guarding.  Genitourinary:       Vulva is normal without lesions Vagina is pink moist without discharge Cervix normal in appearance and pap is normal Uterus is normal size, contour, position, consistency, mobility, non-tender Adnexa is negative with normal sized ovaries by sonogram  Musculoskeletal: Normal range of motion. She exhibits no edema and no tenderness.  Neurological: She is alert and oriented to person, place, and time. She has normal reflexes. She displays normal reflexes. No cranial nerve deficit. She exhibits normal muscle tone. Coordination normal.  Skin: Skin is warm and dry. No rash noted. No erythema. No pallor.  Psychiatric: She has a normal mood and affect. Her behavior is normal. Judgment and thought content normal.    Labs: No results found for this or any previous visit (from the past 336 hour(s)).  EKG: Orders  placed or performed in visit on 10/06/16  . EKG 12-Lead    Imaging Studies: US Pelvic Complete With Transvaginal  Result Date: 07/28/2017 CLINICAL DATA:  Dysmenorrhea and metrorrhagia for 3 years. Tubal ligation. LMP 02/07/2017. EXAM: TRANSABDOMINAL AND TRANSVAGINAL ULTRASOUND OF PELVIS TECHNIQUE: Both transabdominal and transvaginal ultrasound examinations of the pelvis were performed. Transabdominal technique was performed for global imaging of the pelvis including uterus, ovaries, adnexal regions, and pelvic cul-de-sac. It was necessary to proceed with endovaginal exam following the transabdominal exam to visualize the ovaries. COMPARISON:  None FINDINGS: Uterus Measurements: 11.1 x 4.7 x 6.6 centimeters. No fibroids or other mass visualized. Endometrium Thickness: 9.7 millimeters. Small amount of fluid is noted in the endometrial canal. Right ovary Measurements: 2.5 x 2.3 x 2.0 centimeter. Normal appearance/no adnexal mass. Left ovary  Measurements: The ovary is not visualized, either absent or obscured. No adnexal mass. Other findings No abnormal free fluid. IMPRESSION: 1. Normal endometrial thickness. If bleeding remains unresponsive to hormonal or medical therapy, sonohysterogram should be considered for focal lesion work-up. (Ref: Radiological Reasoning: Algorithmic Workup of Abnormal Vaginal Bleeding with Endovaginal Sonography and Sonohysterography. AJR 2008; [id number]:W96-04) 2. No uterine or endometrial mass. 3. Normal appearance the RIGHT ovary; nonvisualized LEFT ovary. Electronically Signed   By: Norva Pavlov M.D.   On: 07/28/2017 08:35      Assessment: Menometrorrhagia dysmenorrhea Patient Active Problem List   Diagnosis Date Noted  . 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 Anemia 11/04/2016  . Anxiety 11/04/2016    Plan: Hysteroscopy uterine curettage Mirena endometrial ablation  Lazaro Arms 07/31/2017 3:33 PM     Face to face time:  15 minutes  Greater  than 50% of the visit time was spent in counseling and coordination of care with the patient.  The summary and outline of the counseling and care coordination is summarized in the note above.   All questions were answered.

## 2017-08-07 NOTE — Patient Instructions (Signed)
Donna Long  08/07/2017     @PREFPERIOPPHARMACY @   Your procedure is scheduled on  08/16/2017 .  Report to [facility] at   1115   A.M.  Call this number if you have problems the morning of surgery:  [phone]   Remember:  Do not eat or drink after midnight.  You may drink clear liquids until  12 midnight 08/15/2017 .  Clear liquids allowed are:                    Water, Juice (non-citric and without pulp), Carbonated beverages, Clear Tea, Black Coffee only, Plain Jell-O only, Gatorade and Plain Popsicles only    Take these medicines the morning of surgery with A SIP OF WATER None    Do not wear jewelry, make-up or nail polish.  Do not wear lotions, powders, or perfumes, or deodorant.  Do not shave 48 hours prior to surgery.  Men may shave face and neck.  Do not bring valuables to the hospital.  [facility] is not responsible for any belongings or valuables.  Contacts, dentures or bridgework may not be worn into surgery.  Leave your suitcase in the car.  After surgery it may be brought to your room.  For patients admitted to the hospital, discharge time will be determined by your treatment team.  Patients discharged the day of surgery will not be allowed to drive home.   Name and phone number of your driver:   family Special instructions:  None  Please read over the following fact sheets that you were given. Anesthesia Post-op Instructions and Care and Recovery After Surgery       Dilation and Curettage or Vacuum Curettage Dilation and curettage (D&C) and vacuum curettage are minor procedures. A D&C involves stretching (dilation) the cervix and scraping (curettage) the inside lining of the uterus (endometrium). During a D&C, tissue is gently scraped from the endometrium, starting from the top portion of the uterus down to the lowest part of the uterus (cervix). During a vacuum curettage, the lining and tissue in the uterus are removed with the use  of gentle suction. Curettage may be performed to either diagnose or treat a problem. As a diagnostic procedure, curettage is performed to examine tissues from the uterus. A diagnostic curettage may be done if you have:  Irregular bleeding in the uterus.  Bleeding with the development of clots.  Spotting between menstrual periods.  Prolonged menstrual periods or other abnormal bleeding.  Bleeding after menopause.  No menstrual period (amenorrhea).  A change in size and shape of the uterus.  Abnormal endometrial cells discovered during a Pap test.  As a treatment procedure, curettage may be performed for the following reasons:  Removal of an IUD (intrauterine device).  Removal of retained placenta after giving birth.  Abortion.  Miscarriage.  Removal of endometrial polyps.  Removal of uncommon types of noncancerous lumps (fibroids).  Tell a health care provider about:  Any allergies you have, including allergies to prescribed medicine or latex.  All medicines you are taking, including vitamins, herbs, eye drops, creams, and over-the-counter medicines. This is especially important if you take any blood-thinning medicine. Bring a list of all of your medicines to your appointment.  Any problems you or family members have had with anesthetic medicines.  Any blood disorders you have.  Any surgeries you have had.  Your medical history and any  medical conditions you have.  Whether you are pregnant or may be pregnant.  Recent vaginal infections you have had.  Recent menstrual periods, bleeding problems you have had, and what form of birth control (contraception) you use. What are the risks? Generally, this is a safe procedure. However, problems may occur, including:  Infection.  Heavy vaginal bleeding.  Allergic reactions to medicines.  Damage to the cervix or other structures or organs.  Development of scar tissue (adhesions) inside the uterus, which can cause  abnormal amounts of menstrual bleeding. This may make it harder to get pregnant in the future.  A hole (perforation) or puncture in the uterine wall. This is rare.  What happens before the procedure? Staying hydrated Follow instructions from your health care provider about hydration, which may include:  Up to 2 hours before the procedure - you may continue to drink clear liquids, such as water, clear fruit juice, black coffee, and plain tea.  Eating and drinking restrictions Follow instructions from your health care provider about eating and drinking, which may include:  8 hours before the procedure - stop eating heavy meals or foods such as meat, fried foods, or fatty foods.  6 hours before the procedure - stop eating light meals or foods, such as toast or cereal.  6 hours before the procedure - stop drinking milk or drinks that contain milk.  2 hours before the procedure - stop drinking clear liquids. If your health care provider told you to take your medicine(s) on the day of your procedure, take them with only a sip of water.  Medicines  Ask your health care provider about: ? Changing or stopping your regular medicines. This is especially important if you are taking diabetes medicines or blood thinners. ? Taking medicines such as aspirin and ibuprofen. These medicines can thin your blood. Do not take these medicines before your procedure if your health care provider instructs you not to.  You may be given antibiotic medicine to help prevent infection. General instructions  For 24 hours before your procedure, do not: ? Douche. ? Use tampons. ? Use medicines, creams, or suppositories in the vagina. ? Have sexual intercourse.  You may be given a pregnancy test on the day of the procedure.  Plan to have someone take you home from the hospital or clinic.  You may have a blood or urine sample taken.  If you will be going home right after the procedure, plan to have someone  with you for 24 hours. What happens during the procedure?  To reduce your risk of infection: ? Your health care team will wash or sanitize their hands. ? Your skin will be washed with soap.  An IV tube will be inserted into one of your veins.  You will be given one of the following: ? A medicine that numbs the area in and around the cervix (local anesthetic). ? A medicine to make you fall asleep (general anesthetic).  You will lie down on your back, with your feet in foot rests (stirrups).  The size and position of your uterus will be checked.  A lubricated instrument (speculum or Sims retractor) will be inserted into the back side of your vagina. The speculum will be used to hold apart the walls of your vagina so your health care provider can see your cervix.  A tool (tenaculum) will be attached to the lip of the cervix to stabilize it.  Your cervix will be softened and dilated. This may be done  by: ? Taking a medicine. ? Having tapered dilators or thin rods (laminaria) or gradual widening instruments (tapered dilators) inserted into your cervix.  A small, sharp, curved instrument (curette) will be used to scrape a small amount of tissue or cells from the endometrium or cervical canal. In some cases, gentle suction is applied with the curette. The curette will then be removed. The cells will be taken to a lab for testing. The procedure may vary among health care providers and hospitals. What happens after the procedure?  You may have mild cramping, backache, pain, and light bleeding or spotting. You may pass small blood clots from your vagina.  You may have to wear compression stockings. These stockings help to prevent blood clots and reduce swelling in your legs.  Your blood pressure, heart rate, breathing rate, and blood oxygen level will be monitored until the medicines you were given have worn off. Summary  Dilation and curettage (D&C) involves stretching (dilation) the  cervix and scraping (curettage) the inside lining of the uterus (endometrium).  After the procedure, you may have mild cramping, backache, pain, and light bleeding or spotting. You may pass small blood clots from your vagina.  Plan to have someone take you home from the hospital or clinic. This information is not intended to replace advice given to you by your health care provider. Make sure you discuss any questions you have with your health care provider. Document Released: 01/24/2005 Document Revised: 10/11/2015 Document Reviewed: 10/11/2015 Elsevier Interactive Patient Education  2018 [employer].  Dilation and Curettage or Vacuum Curettage, Care After These instructions give you information about caring for yourself after your procedure. Your doctor may also give you more specific instructions. Call your doctor if you have any problems or questions after your procedure. Follow these instructions at home: Activity  Do not drive or use heavy machinery while taking prescription pain medicine.  For 24 hours after your procedure, avoid driving.  Take short walks often, followed by rest periods. Ask your doctor what activities are safe for you. After one or two days, you may be able to return to your normal activities.  Do not lift anything that is heavier than 10 lb (4.5 kg) until your doctor approves.  For at least 2 weeks, or as long as told by your doctor: ? Do not douche. ? Do not use tampons. ? Do not have sex. General instructions  Take over-the-counter and prescription medicines only as told by your doctor. This is very important if you take blood thinning medicine.  Do not take baths, swim, or use a hot tub until your doctor approves. Take showers instead of baths.  Wear compression stockings as told by your doctor.  It is up to you to get the results of your procedure. Ask your doctor when your results will be ready.  Keep all follow-up visits as told by your doctor.  This is important. Contact a doctor if:  You have very bad cramps that get worse or do not get better with medicine.  You have very bad pain in your belly (abdomen).  You cannot drink fluids without throwing up (vomiting).  You get pain in a different part of the area between your belly and thighs (pelvis).  You have bad-smelling discharge from your vagina.  You have a rash. Get help right away if:  You are bleeding a lot from your vagina. A lot of bleeding means soaking more than one sanitary pad in an hour, for 2 hours  in a row.  You have clumps of blood (blood clots) coming from your vagina.  You have a fever or chills.  Your belly feels very tender or hard.  You have chest pain.  You have trouble breathing.  You cough up blood.  You feel dizzy.  You feel light-headed.  You pass out (faint).  You have pain in your neck or shoulder area. Summary  Take short walks often, followed by rest periods. Ask your doctor what activities are safe for you. After one or two days, you may be able to return to your normal activities.  Do not lift anything that is heavier than 10 lb (4.5 kg) until your doctor approves.  Do not take baths, swim, or use a hot tub until your doctor approves. Take showers instead of baths.  Contact your doctor if you have any symptoms of infection, like bad-smelling discharge from your vagina. This information is not intended to replace advice given to you by your health care provider. Make sure you discuss any questions you have with your health care provider. Document Released: 11/03/2007 Document Revised: 10/12/2015 Document Reviewed: 10/12/2015 Elsevier Interactive Patient Education  2017 [employer] Hysteroscopy Hysteroscopy is a procedure used for looking inside the womb (uterus). It may be done for various reasons, including:  To evaluate abnormal bleeding, fibroid (benign, noncancerous) tumors, polyps, scar tissue (adhesions), and  possibly cancer of the uterus.  To look for lumps (tumors) and other uterine growths.  To look for causes of why a woman cannot get pregnant (infertility), causes of recurrent loss of pregnancy (miscarriages), or a lost intrauterine device (IUD).  To perform a sterilization by blocking the fallopian tubes from inside the uterus.  In this procedure, a thin, flexible tube with a tiny light and camera on the end of it (hysteroscope) is used to look inside the uterus. A hysteroscopy should be done right after a menstrual period to be sure you are not pregnant. LET [facility] CARE PROVIDER KNOW ABOUT:  Any allergies you have.  All medicines you are taking, including vitamins, herbs, eye drops, creams, and over-the-counter medicines.  Previous problems you or members of your family have had with the use of anesthetics.  Any blood disorders you have.  Previous surgeries you have had.  Medical conditions you have. RISKS AND COMPLICATIONS Generally, this is a safe procedure. However, as with any procedure, complications can occur. Possible complications include:  Putting a hole in the uterus.  Excessive bleeding.  Infection.  Damage to the cervix.  Injury to other organs.  Allergic reaction to medicines.  Too much fluid used in the uterus for the procedure.  BEFORE THE PROCEDURE  Ask your health care provider about changing or stopping any regular medicines.  Do not take aspirin or blood thinners for 1 week before the procedure, or as directed by your health care provider. These can cause bleeding.  If you smoke, do not smoke for 2 weeks before the procedure.  In some cases, a medicine is placed in the cervix the day before the procedure. This medicine makes the cervix have a larger opening (dilate). This makes it easier for the instrument to be inserted into the uterus during the procedure.  Do not eat or drink anything for at least 8 hours before the surgery.  Arrange for  someone to take you home after the procedure. PROCEDURE  You may be given a medicine to relax you (sedative). You may also be given one of the following: ?  A medicine that numbs the area around the cervix (local anesthetic). ? A medicine that makes you sleep through the procedure (general anesthetic).  The hysteroscope is inserted through the vagina into the uterus. The camera on the hysteroscope sends a picture to a TV screen. This gives the surgeon a good view inside the uterus.  During the procedure, air or a liquid is put into the uterus, which allows the surgeon to see better.  Sometimes, tissue is gently scraped from inside the uterus. These tissue samples are sent to a lab for testing. What to expect after the procedure  If you had a general anesthetic, you may be groggy for a couple hours after the procedure.  If you had a local anesthetic, you will be able to go home as soon as you are stable and feel ready.  You may have some cramping. This normally lasts for a couple days.  You may have bleeding, which varies from light spotting for a few days to menstrual-like bleeding for 3-7 days. This is normal.  If your test results are not back during the visit, make an appointment with your health care provider to find out the results. This information is not intended to replace advice given to you by your health care provider. Make sure you discuss any questions you have with your health care provider. Document Released: 05/02/2000 Document Revised: 07/02/2015 Document Reviewed: 08/23/2012 Elsevier Interactive Patient Education  2017 Elsevier Inc. Hysteroscopy, Care After Refer to this sheet in the next few weeks. These instructions provide you with information on caring for yourself after your procedure. Your health care provider may also give you more specific instructions. Your treatment has been planned according to current medical practices, but problems sometimes occur. Call your  health care provider if you have any problems or questions after your procedure. What can I expect after the procedure? After your procedure, it is typical to have the following:  You may have some cramping. This normally lasts for a couple days.  You may have bleeding. This can vary from light spotting for a few days to menstrual-like bleeding for 3-7 days.  Follow these instructions at home:  Rest for the first 1-2 days after the procedure.  Only take over-the-counter or prescription medicines as directed by your health care provider. Do not take aspirin. It can increase the chances of bleeding.  Take showers instead of baths for 2 weeks or as directed by your health care provider.  Do not drive for 24 hours or as directed.  Do not drink alcohol while taking pain medicine.  Do not use tampons, douche, or have sexual intercourse for 2 weeks or until your health care provider says it is okay.  Take your temperature twice a day for 4-5 days. Write it down each time.  Follow your health care provider's advice about diet, exercise, and lifting.  If you develop constipation, you may: ? Take a mild laxative if your health care provider approves. ? Add bran foods to your diet. ? Drink enough fluids to keep your urine clear or pale yellow.  Try to have someone with you or available to you for the first 24-48 hours, especially if you were given a general anesthetic.  Follow up with your health care provider as directed. Contact a health care provider if:  You feel dizzy or lightheaded.  You feel sick to your stomach (nauseous).  You have abnormal vaginal discharge.  You have a rash.  You have pain that  is not controlled with medicine. Get help right away if:  You have bleeding that is heavier than a normal menstrual period.  You have a fever.  You have increasing cramps or pain, not controlled with medicine.  You have new belly (abdominal) pain.  You pass out.  You  have pain in the tops of your shoulders (shoulder strap areas).  You have shortness of breath. This information is not intended to replace advice given to you by your health care provider. Make sure you discuss any questions you have with your health care provider. Document Released: 11/14/2012 Document Revised: 07/02/2015 Document Reviewed: 08/23/2012 Elsevier Interactive Patient Education  2017 [employer] Inc.  Endometrial Ablation Endometrial ablation is a procedure that destroys the thin inner layer of the lining of the uterus (endometrium). This procedure may be done:  To stop heavy periods.  To stop bleeding that is causing anemia.  To control irregular bleeding.  To treat bleeding caused by small tumors (fibroids) in the endometrium.  This procedure is often an alternative to major surgery, such as removal of the uterus and cervix (hysterectomy). As a result of this procedure:  You may not be able to have children. However, if you are premenopausal (you have not gone through menopause): ? You may still have a small chance of getting pregnant. ? You will need to use a reliable method of birth control after the procedure to prevent pregnancy.  You may stop having a menstrual period, or you may have only a small amount of bleeding during your period. Menstruation may return several years after the procedure.  Tell a health care provider about:  Any allergies you have.  All medicines you are taking, including vitamins, herbs, eye drops, creams, and over-the-counter medicines.  Any problems you or family members have had with the use of anesthetic medicines.  Any blood disorders you have.  Any surgeries you have had.  Any medical conditions you have. What are the risks? Generally, this is a safe procedure. However, problems may occur, including:  A hole (perforation) in the uterus or bowel.  Infection of the uterus, bladder, or vagina.  Bleeding.  Damage to other  structures or organs.  An air bubble in the lung (air embolus).  Problems with pregnancy after the procedure.  Failure of the procedure.  Decreased ability to diagnose cancer in the endometrium.  What happens before the procedure?  You will have tests of your endometrium to make sure there are no pre-cancerous cells or cancer cells present.  You may have an ultrasound of the uterus.  You may be given medicines to thin the endometrium.  Ask your health care provider about: ? Changing or stopping your regular medicines. This is especially important if you take diabetes medicines or blood thinners. ? Taking medicines such as aspirin and ibuprofen. These medicines can thin your blood. Do not take these medicines before your procedure if your doctor tells you not to.  Plan to have someone take you home from the hospital or clinic. What happens during the procedure?  You will lie on an exam table with your feet and legs supported as in a pelvic exam.  To lower your risk of infection: ? Your health care team will wash or sanitize their hands and put on germ-free (sterile) gloves. ? Your genital area will be washed with soap.  An IV tube will be inserted into one of your veins.  You will be given a medicine to help you relax (sedative).  A surgical instrument with a light and camera (resectoscope) will be inserted into your vagina and moved into your uterus. This allows your surgeon to see inside your uterus.  Endometrial tissue will be removed using one of the following methods: ? Radiofrequency. This method uses a radiofrequency-alternating electric current to remove the endometrium. ? Cryotherapy. This method uses extreme cold to freeze the endometrium. ? Heated-free liquid. This method uses a heated saltwater (saline) solution to remove the endometrium. ? Microwave. This method uses high-energy microwaves to heat up the endometrium and remove it. ? Thermal balloon. This method  involves inserting a catheter with a balloon tip into the uterus. The balloon tip is filled with heated fluid to remove the endometrium. The procedure may vary among health care providers and hospitals. What happens after the procedure?  Your blood pressure, heart rate, breathing rate, and blood oxygen level will be monitored until the medicines you were given have worn off.  As tissue healing occurs, you may notice vaginal bleeding for 4-6 weeks after the procedure. You may also experience: ? Cramps. ? Thin, watery vaginal discharge that is light pink or brown in color. ? A need to urinate more frequently than usual. ? Nausea.  Do not drive for 24 hours if you were given a sedative.  Do not have sex or insert anything into your vagina until your health care provider approves. Summary  Endometrial ablation is done to treat the many causes of heavy menstrual bleeding.  The procedure may be done only after medications have been tried to control the bleeding.  Plan to have someone take you home from the hospital or clinic. This information is not intended to replace advice given to you by your health care provider. Make sure you discuss any questions you have with your health care provider. Document Released: 12/04/2003 Document Revised: 02/11/2016 Document Reviewed: 02/11/2016 Elsevier Interactive Patient Education  2017 [employer]  General Anesthesia, Adult General anesthesia is the use of medicines to make a person "go to sleep" (be unconscious) for a medical procedure. General anesthesia is often recommended when a procedure:  Is long.  Requires you to be still or in an unusual position.  Is major and can cause you to lose blood.  Is impossible to do without general anesthesia.  The medicines used for general anesthesia are called general anesthetics. In addition to making you sleep, the medicines:  Prevent pain.  Control your blood pressure.  Relax your  muscles.  Tell a health care provider about:  Any allergies you have.  All medicines you are taking, including vitamins, herbs, eye drops, creams, and over-the-counter medicines.  Any problems you or family members have had with anesthetic medicines.  Types of anesthetics you have had in the past.  Any bleeding disorders you have.  Any surgeries you have had.  Any medical conditions you have.  Any history of heart or lung conditions, such as heart failure, sleep apnea, or chronic obstructive pulmonary disease (COPD).  Whether you are pregnant or may be pregnant.  Whether you use tobacco, alcohol, marijuana, or street drugs.  Any history of Financial planner.  Any history of depression or anxiety. What are the risks? Generally, this is a safe procedure. However, problems may occur, including:  Allergic reaction to anesthetics.  Lung and heart problems.  Inhaling food or liquids from your stomach into your lungs (aspiration).  Injury to nerves.  Waking up during your procedure and being unable to move (rare).  Extreme agitation  or a state of mental confusion (delirium) when you wake up from the anesthetic.  Air in the bloodstream, which can lead to stroke.  These problems are more likely to develop if you are having a major surgery or if you have an advanced medical condition. You can prevent some of these complications by answering all of your health care provider's questions thoroughly and by following all pre-procedure instructions. General anesthesia can cause side effects, including:  Nausea or vomiting  A sore throat from the breathing tube.  Feeling cold or shivery.  Feeling tired, washed out, or achy.  Sleepiness or drowsiness.  Confusion or agitation.  What happens before the procedure? Staying hydrated Follow instructions from your health care provider about hydration, which may include:  Up to 2 hours before the procedure - you may continue to  drink clear liquids, such as water, clear fruit juice, black coffee, and plain tea.  Eating and drinking restrictions Follow instructions from your health care provider about eating and drinking, which may include:  8 hours before the procedure - stop eating heavy meals or foods such as meat, fried foods, or fatty foods.  6 hours before the procedure - stop eating light meals or foods, such as toast or cereal.  6 hours before the procedure - stop drinking milk or drinks that contain milk.  2 hours before the procedure - stop drinking clear liquids.  Medicines  Ask your health care provider about: ? Changing or stopping your regular medicines. This is especially important if you are taking diabetes medicines or blood thinners. ? Taking medicines such as aspirin and ibuprofen. These medicines can thin your blood. Do not take these medicines before your procedure if your health care provider instructs you not to. ? Taking new dietary supplements or medicines. Do not take these during the week before your procedure unless your health care provider approves them.  If you are told to take a medicine or to continue taking a medicine on the day of the procedure, take the medicine with sips of water. General instructions   Ask if you will be going home the same day, the following day, or after a longer hospital stay. ? Plan to have someone take you home. ? Plan to have someone stay with you for the first 24 hours after you leave the hospital or clinic.  For 3-6 weeks before the procedure, try not to use any tobacco products, such as cigarettes, chewing tobacco, and e-cigarettes.  You may brush your teeth on the morning of the procedure, but make sure to spit out the toothpaste. What happens during the procedure?  You will be given anesthetics through a mask and through an IV tube in one of your veins.  You may receive medicine to help you relax (sedative).  As soon as you are asleep, a  breathing tube may be used to help you breathe.  An anesthesia specialist will stay with you throughout the procedure. He or she will help keep you comfortable and safe by continuing to give you medicines and adjusting the amount of medicine that you get. He or she will also watch your blood pressure, pulse, and oxygen levels to make sure that the anesthetics do not cause any problems.  If a breathing tube was used to help you breathe, it will be removed before you wake up. The procedure may vary among health care providers and hospitals. What happens after the procedure?  You will wake up, often slowly, after the  procedure is complete, usually in a recovery area.  Your blood pressure, heart rate, breathing rate, and blood oxygen level will be monitored until the medicines you were given have worn off.  You may be given medicine to help you calm down if you feel anxious or agitated.  If you will be going home the same day, your health care provider may check to make sure you can stand, drink, and urinate.  Your health care providers will treat your pain and side effects before you go home.  Do not drive for 24 hours if you received a sedative.  You may: ? Feel nauseous and vomit. ? Have a sore throat. ? Have mental slowness. ? Feel cold or shivery. ? Feel sleepy. ? Feel tired. ? Feel sore or achy, even in parts of your body where you did not have surgery. This information is not intended to replace advice given to you by your health care provider. Make sure you discuss any questions you have with your health care provider. Document Released: 05/03/2007 Document Revised: 07/07/2015 Document Reviewed: 01/08/2015 Elsevier Interactive Patient Education  2018 [employer]. General Anesthesia, Adult, Care After These instructions provide you with information about caring for yourself after your procedure. Your health care provider may also give you more specific instructions. Your  treatment has been planned according to current medical practices, but problems sometimes occur. Call your health care provider if you have any problems or questions after your procedure. What can I expect after the procedure? After the procedure, it is common to have:  Vomiting.  A sore throat.  Mental slowness.  It is common to feel:  Nauseous.  Cold or shivery.  Sleepy.  Tired.  Sore or achy, even in parts of your body where you did not have surgery.  Follow these instructions at home: For at least 24 hours after the procedure:  Do not: ? Participate in activities where you could fall or become injured. ? Drive. ? Use heavy machinery. ? Drink alcohol. ? Take sleeping pills or medicines that cause drowsiness. ? Make important decisions or sign legal documents. ? Take care of children on your own.  Rest. Eating and drinking  If you vomit, drink water, juice, or soup when you can drink without vomiting.  Drink enough fluid to keep your urine clear or pale yellow.  Make sure you have little or no nausea before eating solid foods.  Follow the diet recommended by your health care provider. General instructions  Have a responsible adult stay with you until you are awake and alert.  Return to your normal activities as told by your health care provider. Ask your health care provider what activities are safe for you.  Take over-the-counter and prescription medicines only as told by your health care provider.  If you smoke, do not smoke without supervision.  Keep all follow-up visits as told by your health care provider. This is important. Contact a health care provider if:  You continue to have nausea or vomiting at home, and medicines are not helpful.  You cannot drink fluids or start eating again.  You cannot urinate after 8-12 hours.  You develop a skin rash.  You have fever.  You have increasing redness at the site of your procedure. Get help right  away if:  You have difficulty breathing.  You have chest pain.  You have unexpected bleeding.  You feel that you are having a life-threatening or urgent problem. This information is not intended to  replace advice given to you by your health care provider. Make sure you discuss any questions you have with your health care provider. Document Released: 05/02/2000 Document Revised: 06/29/2015 Document Reviewed: 01/08/2015 Elsevier Interactive Patient Education  [employer].

## 2017-08-09 ENCOUNTER — Other Ambulatory Visit: Payer: Self-pay

## 2017-08-09 ENCOUNTER — Encounter (HOSPITAL_COMMUNITY)
Admission: RE | Admit: 2017-08-09 | Discharge: 2017-08-09 | Disposition: A | Discharge status: Home / Self Care | Payer: Self-pay | Source: Ambulatory Visit | Attending: Obstetrics & Gynecology | Admitting: Obstetrics & Gynecology

## 2017-08-09 ENCOUNTER — Other Ambulatory Visit: Payer: Self-pay | Admitting: Obstetrics & Gynecology

## 2017-08-09 ENCOUNTER — Ambulatory Visit: Payer: Self-pay | Admitting: Physician Assistant

## 2017-08-09 ENCOUNTER — Encounter (HOSPITAL_COMMUNITY): Payer: Self-pay

## 2017-08-09 ENCOUNTER — Encounter: Payer: Self-pay | Admitting: Physician Assistant

## 2017-08-09 VITALS — BP 131/82 | HR 71 | Temp 97.9°F | Ht 66.5 in | Wt 292.0 lb

## 2017-08-09 DIAGNOSIS — R002 Palpitations: Secondary | ICD-10-CM

## 2017-08-09 DIAGNOSIS — J329 Chronic sinusitis, unspecified: Secondary | ICD-10-CM

## 2017-08-09 DIAGNOSIS — Z01812 Encounter for preprocedural laboratory examination: Secondary | ICD-10-CM | POA: Insufficient documentation

## 2017-08-09 DIAGNOSIS — N921 Excessive and frequent menstruation with irregular cycle: Secondary | ICD-10-CM

## 2017-08-09 HISTORY — DX: Cardiac arrhythmia, unspecified: I49.9

## 2017-08-09 HISTORY — DX: Other chronic pain: G89.29

## 2017-08-09 HISTORY — DX: Dorsalgia, unspecified: M54.9

## 2017-08-09 HISTORY — DX: Unspecified osteoarthritis, unspecified site: M19.90

## 2017-08-09 LAB — COMPREHENSIVE METABOLIC PANEL
ALT: 19 U/L (ref 0–44)
ANION GAP: 10 (ref 5–15)
AST: 17 U/L (ref 15–41)
Albumin: 3.6 g/dL (ref 3.5–5.0)
Alkaline Phosphatase: 87 U/L (ref 38–126)
BILIRUBIN TOTAL: 0.4 mg/dL (ref 0.3–1.2)
BUN: 10 mg/dL (ref 6–20)
CALCIUM: 8.9 mg/dL (ref 8.9–10.3)
CO2: 19 mmol/L — ABNORMAL LOW (ref 22–32)
CREATININE: 0.65 mg/dL (ref 0.44–1.00)
Chloride: 109 mmol/L (ref 98–111)
GFR calc non Af Amer: >60 mL/min (ref >60)
Glucose, Bld: 189 mg/dL — ABNORMAL HIGH (ref 70–99)
Potassium: 3.4 mmol/L — ABNORMAL LOW (ref 3.5–5.1)
Sodium: 138 mmol/L (ref 135–145)
TOTAL PROTEIN: 7.1 g/dL (ref 6.5–8.1)

## 2017-08-09 LAB — CBC
HEMATOCRIT: 38 % (ref 36.0–46.0)
HEMOGLOBIN: 11.9 g/dL — AB (ref 12.0–15.0)
MCH: 24.5 pg — AB (ref 26.0–34.0)
MCHC: 31.3 g/dL (ref 30.0–36.0)
MCV: 78.4 fL (ref 78.0–100.0)
Platelets: 374 10*3/uL (ref 150–400)
RBC: 4.85 MIL/uL (ref 3.87–5.11)
RDW: 17.8 % — ABNORMAL HIGH (ref 11.5–15.5)
WBC: 13 10*3/uL — ABNORMAL HIGH (ref 4.0–10.5)

## 2017-08-09 LAB — HCG, QUANTITATIVE, PREGNANCY: hCG, Beta Chain, Quant, S: <1 m[IU]/mL (ref <5)

## 2017-08-09 LAB — RAPID HIV SCREEN (HIV 1/2 AB+AG)
HIV 1/2 ANTIBODIES: NONREACTIVE
HIV-1 P24 ANTIGEN - HIV24: NONREACTIVE

## 2017-08-09 MED ORDER — METOPROLOL TARTRATE 25 MG PO TABS: 25.0000 mg | ORAL_TABLET | Freq: Two times a day (BID) | ORAL | 1 refills | Status: DC

## 2017-08-09 NOTE — Progress Notes (Signed)
BP 131/82 (BP Location: Right Arm, Patient Position: Sitting, Cuff Size: Large)   Pulse 71   Temp 97.9 F (36.6 C)   Ht 5' 6.5" (1.689 m)   Wt 292 lb (132.5 kg)   SpO2 98%   BMI 46.42 kg/m    Subjective:    Patient ID: Donna Long, female    DOB: Jan 28, 1971, 47 y.o.   MRN: [id number]  HPI: Donna Long is a 47 y.o. female presenting on 08/09/2017 for New Patient (Initial Visit)   HPI   Pt is a returning pt.  She was last seen 11/14/16 for sinusitus and she was no show to her routine appt 12/13/16  She has seen orthopedics/dr Ophelia CharterYates for chronic LBP. He recommended weight loss  She saw cardiologist/dr Ladona Ridgelaylor for SVT who recomended ablation which has not yet been done.  Pt is out of her beta blocker because she was prescribed toprol XL which is expensive.  She says she hasn't decided to get the ablation yet because she is worried about the risk for needing a pacemaker afterwards.  [facility]  She is currently taking megace for this reason.  She says the megace gives her cramps and makes her stomach hurt.  She says she will be stopping this medication soon.   Pt is on augmentin for a problem with her teeth  Pt didn't ever go for her diagnostic mammogram that was scheduled for her.    Pt is now wanting to see ENT because her sinuses are bothering her.   Relevant past medical, surgical, family and social history reviewed and updated as indicated. Interim medical history since our last visit reviewed. Allergies and medications reviewed and updated.   Current Outpatient Medications:  .  amoxicillin-clavulanate (AUGMENTIN) 875-125 MG tablet, Take 1 tablet by mouth 2 (two) times daily., Disp: , Rfl:  .  ibuprofen (ADVIL,MOTRIN) 600 MG tablet, Take 600 mg by mouth 2 (two) times daily as needed (for pain.)., Disp: , Rfl:  .  megestrol (MEGACE) 40 MG tablet, 3 tablets a day for 5 days, 2 tablets a day for 5 days then 1 tablet  daily (Patient taking differently: Take 40 mg by mouth daily. ), Disp: 45 tablet, Rfl: 3   Review of Systems  Constitutional: Negative for appetite change, chills, diaphoresis, fatigue, fever and unexpected weight change.  HENT: Positive for dental problem. Negative for congestion, drooling, ear pain, facial swelling, hearing loss, mouth sores, sneezing, sore throat, trouble swallowing and voice change.   Eyes: Positive for itching and visual disturbance. Negative for pain, discharge and redness.  Respiratory: Negative for cough, choking, shortness of breath and wheezing.   Cardiovascular: Positive for palpitations. Negative for chest pain and leg swelling.  Gastrointestinal: Positive for abdominal pain, diarrhea and vomiting. Negative for blood in stool and constipation.  Endocrine: Positive for polydipsia. Negative for cold intolerance and heat intolerance.  Genitourinary: Negative for decreased urine volume, dysuria and hematuria.  Musculoskeletal: Positive for arthralgias, back pain and gait problem.  Skin: Negative for rash.  Allergic/Immunologic: Negative for environmental allergies.  Neurological: Positive for light-headedness and headaches. Negative for seizures and syncope.  Hematological: Negative for adenopathy.  Psychiatric/Behavioral: Negative for agitation, dysphoric mood and suicidal ideas. The patient is nervous/anxious.     Per HPI unless specifically indicated above     Objective:    BP 131/82 (BP Location: Right Arm, Patient Position: Sitting, Cuff Size: Large)   Pulse 71  Temp 97.9 F (36.6 C)   Ht 5' 6.5" (1.689 m)   Wt 292 lb (132.5 kg)   SpO2 98%   BMI 46.42 kg/m   Wt Readings from Last 3 Encounters:  08/09/17 292 lb (132.5 kg)  07/31/17 291 lb (132 kg)  06/29/17 288 lb (130.6 kg)    Physical Exam  Constitutional: She is oriented to person, place, and time. She appears well-developed and well-nourished.  HENT:  Head: Normocephalic and atraumatic.   Right Ear: Tympanic membrane, external ear and ear canal normal.  Left Ear: Tympanic membrane, external ear and ear canal normal.  Nose: Mucosal edema present. No septal deviation.  Mouth/Throat: Oropharynx is clear and moist. No oropharyngeal exudate.  Swelling R nasal passage more that on the L.  No purulence.  Eyes: Pupils are equal, round, and reactive to light. Conjunctivae and EOM are normal.  Neck: Neck supple. No thyromegaly present.  Cardiovascular: Normal rate and regular rhythm.  Pulmonary/Chest: Effort normal and breath sounds normal.  Abdominal: Soft. Bowel sounds are normal. She exhibits no mass. There is no hepatosplenomegaly. There is no tenderness.  Musculoskeletal: She exhibits no edema.  Lymphadenopathy:    She has no cervical adenopathy.  Neurological: She is alert and oriented to person, place, and time. Gait normal.  Skin: Skin is warm and dry.  Psychiatric: She has a normal mood and affect. Her behavior is normal.  Vitals reviewed.       Assessment & Plan:   Encounter Diagnoses  Name Primary?  . Palpitations Yes  . Menometrorrhagia   . Chronic sinusitis, unspecified location   . Morbid obesity (HCC)      -Restart low dose metoprolol.  Will use bid medication due to it is something the pt can afford -Encouraged pt to get cardiac ablation scheduled -pt to follow up with Gyn as scheduled -discussed chronic sinus issues.  Discussed that she is on augmentin now which will treat any infection.  Pt is to use OTC nasal steroid spray like flonase or nasacort  -pt to follow up 1 month to recheck sinuses.  RTO sooner prn

## 2017-08-16 ENCOUNTER — Encounter (HOSPITAL_COMMUNITY): Payer: Self-pay

## 2017-08-16 ENCOUNTER — Encounter (HOSPITAL_COMMUNITY)
Admission: RE | Disposition: A | Discharge status: Home / Self Care | Payer: Self-pay | Source: Ambulatory Visit | Attending: Obstetrics & Gynecology

## 2017-08-16 ENCOUNTER — Ambulatory Visit (HOSPITAL_COMMUNITY): Payer: Self-pay | Admitting: Anesthesiology

## 2017-08-16 ENCOUNTER — Ambulatory Visit (HOSPITAL_COMMUNITY)
Admission: RE | Admit: 2017-08-16 | Discharge: 2017-08-16 | Disposition: A | Discharge status: Home / Self Care | Payer: Self-pay | Source: Ambulatory Visit | Attending: Obstetrics & Gynecology | Admitting: Obstetrics & Gynecology

## 2017-08-16 DIAGNOSIS — N946 Dysmenorrhea, unspecified: Secondary | ICD-10-CM | POA: Insufficient documentation

## 2017-08-16 DIAGNOSIS — Z881 Allergy status to other antibiotic agents status: Secondary | ICD-10-CM | POA: Insufficient documentation

## 2017-08-16 DIAGNOSIS — F329 Major depressive disorder, single episode, unspecified: Secondary | ICD-10-CM | POA: Insufficient documentation

## 2017-08-16 DIAGNOSIS — N921 Excessive and frequent menstruation with irregular cycle: Secondary | ICD-10-CM

## 2017-08-16 DIAGNOSIS — F419 Anxiety disorder, unspecified: Secondary | ICD-10-CM | POA: Insufficient documentation

## 2017-08-16 DIAGNOSIS — I471 Supraventricular tachycardia: Secondary | ICD-10-CM | POA: Insufficient documentation

## 2017-08-16 DIAGNOSIS — Z79899 Other long term (current) drug therapy: Secondary | ICD-10-CM | POA: Insufficient documentation

## 2017-08-16 DIAGNOSIS — K219 Gastro-esophageal reflux disease without esophagitis: Secondary | ICD-10-CM | POA: Insufficient documentation

## 2017-08-16 DIAGNOSIS — N840 Polyp of corpus uteri: Secondary | ICD-10-CM | POA: Insufficient documentation

## 2017-08-16 DIAGNOSIS — Z87891 Personal history of nicotine dependence: Secondary | ICD-10-CM | POA: Insufficient documentation

## 2017-08-16 DIAGNOSIS — Z6841 Body Mass Index (BMI) 40.0 and over, adult: Secondary | ICD-10-CM | POA: Insufficient documentation

## 2017-08-16 DIAGNOSIS — I10 Essential (primary) hypertension: Secondary | ICD-10-CM | POA: Insufficient documentation

## 2017-08-16 HISTORY — PX: DILITATION & CURRETTAGE/HYSTROSCOPY WITH NOVASURE ABLATION: SHX5568

## 2017-08-16 LAB — URINALYSIS, ROUTINE W REFLEX MICROSCOPIC
Bacteria, UA: NONE SEEN
Bilirubin Urine: NEGATIVE
GLUCOSE, UA: NEGATIVE mg/dL
KETONES UR: NEGATIVE mg/dL
Leukocytes, UA: NEGATIVE
NITRITE: NEGATIVE
PH: 6 (ref 5.0–8.0)
Protein, ur: NEGATIVE mg/dL
Specific Gravity, Urine: 1.012 (ref 1.005–1.030)

## 2017-08-16 LAB — GLUCOSE, CAPILLARY: GLUCOSE-CAPILLARY: 92 mg/dL (ref 70–99)

## 2017-08-16 SURGERY — DILATATION & CURETTAGE/HYSTEROSCOPY WITH NOVASURE ABLATION
Anesthesia: General

## 2017-08-16 MED ORDER — DEXAMETHASONE SODIUM PHOSPHATE 4 MG/ML IJ SOLN
INTRAMUSCULAR | Status: AC
  Filled 2017-08-16: qty 3

## 2017-08-16 MED ORDER — LIDOCAINE 2% (20 MG/ML) 5 ML SYRINGE
INTRAMUSCULAR | Status: DC | PRN
  Administered 2017-08-16: 100 mg via INTRAVENOUS

## 2017-08-16 MED ORDER — PROMETHAZINE HCL 25 MG/ML IJ SOLN: 6.2500 mg | INTRAMUSCULAR | Status: DC | PRN

## 2017-08-16 MED ORDER — KETOROLAC TROMETHAMINE 10 MG PO TABS: 10.0000 mg | ORAL_TABLET | Freq: Three times a day (TID) | ORAL | 0 refills | Status: DC | PRN

## 2017-08-16 MED ORDER — MIDAZOLAM HCL 5 MG/5ML IJ SOLN
INTRAMUSCULAR | Status: DC | PRN
  Administered 2017-08-16: 2 mg via INTRAVENOUS

## 2017-08-16 MED ORDER — EPHEDRINE SULFATE 50 MG/ML IJ SOLN
INTRAMUSCULAR | Status: AC
  Filled 2017-08-16: qty 2

## 2017-08-16 MED ORDER — 0.9 % SODIUM CHLORIDE (POUR BTL) OPTIME
TOPICAL | Status: DC | PRN
  Administered 2017-08-16: 1000 mL

## 2017-08-16 MED ORDER — ONDANSETRON HCL 4 MG/2ML IJ SOLN
INTRAMUSCULAR | Status: AC
  Filled 2017-08-16: qty 2

## 2017-08-16 MED ORDER — MEPERIDINE HCL 50 MG/ML IJ SOLN
6.2500 mg | INTRAMUSCULAR | Status: DC | PRN
Start: 2017-08-16 — End: 2017-08-16

## 2017-08-16 MED ORDER — FENTANYL CITRATE (PF) 100 MCG/2ML IJ SOLN
INTRAMUSCULAR | Status: DC | PRN
  Administered 2017-08-16 (×4): 25 ug via INTRAVENOUS

## 2017-08-16 MED ORDER — CEFAZOLIN SODIUM-DEXTROSE 2-4 GM/100ML-% IV SOLN: 2.0000 g | INTRAVENOUS | Status: DC

## 2017-08-16 MED ORDER — FENTANYL CITRATE (PF) 100 MCG/2ML IJ SOLN
INTRAMUSCULAR | Status: AC
  Filled 2017-08-16: qty 2

## 2017-08-16 MED ORDER — KETOROLAC TROMETHAMINE 30 MG/ML IJ SOLN
30.0000 mg | Freq: Once | INTRAMUSCULAR | Status: AC
  Administered 2017-08-16: 30 mg via INTRAVENOUS
  Filled 2017-08-16: qty 1

## 2017-08-16 MED ORDER — HYDROCODONE-ACETAMINOPHEN 7.5-325 MG PO TABS: 1.0000 | ORAL_TABLET | Freq: Once | ORAL | Status: DC | PRN

## 2017-08-16 MED ORDER — LACTATED RINGERS IV SOLN
INTRAVENOUS | Status: DC
  Administered 2017-08-16: 12:00:00 via INTRAVENOUS

## 2017-08-16 MED ORDER — PROPOFOL 10 MG/ML IV BOLUS
INTRAVENOUS | Status: AC
  Filled 2017-08-16: qty 20

## 2017-08-16 MED ORDER — PROMETHAZINE HCL 25 MG PO TABS: 25.0000 mg | ORAL_TABLET | Freq: Four times a day (QID) | ORAL | 1 refills | Status: DC | PRN

## 2017-08-16 MED ORDER — ONDANSETRON HCL 4 MG/2ML IJ SOLN
INTRAMUSCULAR | Status: DC | PRN
  Administered 2017-08-16: 4 mg via INTRAVENOUS

## 2017-08-16 MED ORDER — MIDAZOLAM HCL 2 MG/2ML IJ SOLN
INTRAMUSCULAR | Status: AC
  Filled 2017-08-16: qty 2

## 2017-08-16 MED ORDER — LIDOCAINE HCL (PF) 0.5 % IJ SOLN
INTRAMUSCULAR | Status: AC
  Filled 2017-08-16: qty 50

## 2017-08-16 MED ORDER — HYDROCODONE-ACETAMINOPHEN 5-325 MG PO TABS
1.0000 | ORAL_TABLET | Freq: Four times a day (QID) | ORAL | 0 refills | Status: DC | PRN
Start: 2017-08-16 — End: 2017-09-18

## 2017-08-16 MED ORDER — PROPOFOL 10 MG/ML IV BOLUS
INTRAVENOUS | Status: DC | PRN
  Administered 2017-08-16: 200 mg via INTRAVENOUS

## 2017-08-16 MED ORDER — LACTATED RINGERS IV SOLN: INTRAVENOUS | Status: DC

## 2017-08-16 MED ORDER — SODIUM CHLORIDE 0.9 % IR SOLN
Status: DC | PRN
  Administered 2017-08-16: 1000 mL

## 2017-08-16 MED ORDER — DEXAMETHASONE SODIUM PHOSPHATE 10 MG/ML IJ SOLN
INTRAMUSCULAR | Status: DC | PRN
  Administered 2017-08-16: 4 mg via INTRAVENOUS

## 2017-08-16 MED ORDER — LIDOCAINE HCL (PF) 1 % IJ SOLN
INTRAMUSCULAR | Status: AC
  Filled 2017-08-16: qty 5

## 2017-08-16 MED ORDER — HYDROMORPHONE HCL 1 MG/ML IJ SOLN: 0.2500 mg | INTRAMUSCULAR | Status: DC | PRN

## 2017-08-16 MED ORDER — DEXTROSE 5 % IV SOLN
3.0000 g | INTRAVENOUS | Status: AC
  Administered 2017-08-16: 3 g via INTRAVENOUS
  Filled 2017-08-16: qty 3000

## 2017-08-16 SURGICAL SUPPLY — 31 items
CLOTH BEACON ORANGE TIMEOUT ST (SAFETY) ×2 IMPLANT
COVER LIGHT HANDLE STERIS (MISCELLANEOUS) ×4 IMPLANT
GAUZE SPONGE 4X4 12PLY STRL (GAUZE/BANDAGES/DRESSINGS) ×2 IMPLANT
GAUZE SPONGE 4X4 16PLY XRAY LF (GAUZE/BANDAGES/DRESSINGS) ×2 IMPLANT
GLOVE BIOGEL M 7.0 STRL (GLOVE) ×2 IMPLANT
GLOVE BIOGEL PI IND STRL 6.5 (GLOVE) ×1 IMPLANT
GLOVE BIOGEL PI IND STRL 7.0 (GLOVE) ×3 IMPLANT
GLOVE BIOGEL PI IND STRL 8 (GLOVE) ×1 IMPLANT
GLOVE BIOGEL PI INDICATOR 6.5 (GLOVE) ×1
GLOVE BIOGEL PI INDICATOR 7.0 (GLOVE) ×3
GLOVE BIOGEL PI INDICATOR 8 (GLOVE) ×1
GLOVE ECLIPSE 8.0 STRL XLNG CF (GLOVE) ×2 IMPLANT
GLOVE SURG SS PI 6.5 STRL IVOR (GLOVE) ×2 IMPLANT
GOWN STRL REUS W/TWL LRG LVL3 (GOWN DISPOSABLE) ×2 IMPLANT
GOWN STRL REUS W/TWL XL LVL3 (GOWN DISPOSABLE) ×2 IMPLANT
HANDPIECE ABLA MINERVA ENDO (MISCELLANEOUS) ×2 IMPLANT
INST SET HYSTEROSCOPY (KITS) ×2 IMPLANT
IV NS 1000ML (IV SOLUTION) ×1
IV NS 1000ML BAXH (IV SOLUTION) ×1 IMPLANT
KIT TURNOVER CYSTO (KITS) ×2 IMPLANT
MANIFOLD NEPTUNE II (INSTRUMENTS) ×2 IMPLANT
MARKER SKIN DUAL TIP RULER LAB (MISCELLANEOUS) ×2 IMPLANT
NS IRRIG 1000ML POUR BTL (IV SOLUTION) ×2 IMPLANT
PACK BASIC III (CUSTOM PROCEDURE TRAY) ×1
PACK SRG BSC III STRL LF ECLPS (CUSTOM PROCEDURE TRAY) ×1 IMPLANT
PAD ARMBOARD 7.5X6 YLW CONV (MISCELLANEOUS) ×2 IMPLANT
PAD TELFA 3X4 1S STER (GAUZE/BANDAGES/DRESSINGS) ×2 IMPLANT
SET BASIN LINEN APH (SET/KITS/TRAYS/PACK) ×2 IMPLANT
SET IRRIG Y TYPE TUR BLADDER L (SET/KITS/TRAYS/PACK) ×2 IMPLANT
SHEET LAVH (DRAPES) ×2 IMPLANT
YANKAUER SUCT BULB TIP 10FT TU (MISCELLANEOUS) ×2 IMPLANT

## 2017-08-16 NOTE — Anesthesia Procedure Notes (Signed)
Procedure Name: LMA Insertion Date/Time: 08/16/2017 1:02 PM Performed by: Jhonnie GarnerMarshall, Jaizon Deroos M, CRNA Pre-anesthesia Checklist: Patient identified, Emergency Drugs available, Suction available and Patient being monitored Patient Re-evaluated:Patient Re-evaluated prior to induction Oxygen Delivery Method: Circle system utilized Preoxygenation: Pre-oxygenation with 100% oxygen Induction Type: IV induction Ventilation: Mask ventilation without difficulty LMA: LMA inserted LMA Size: 5.0 Number of attempts: 1 Placement Confirmation: positive ETCO2 and breath sounds checked- equal and bilateral Tube secured with: Tape Dental Injury: Teeth and Oropharynx as per pre-operative assessment

## 2017-08-16 NOTE — Anesthesia Preprocedure Evaluation (Signed)
Anesthesia Evaluation  Patient identified by MRN, date of birth, ID band Patient awake    Reviewed: Allergy & Precautions, H&P , NPO status , Patient's Chart, lab work & pertinent test results, reviewed documented beta blocker date and time   Airway Mallampati: II  TM Distance: >3 FB Neck ROM: full    Dental no notable dental hx. (+) Teeth Intact, Dental Advidsory Given   Pulmonary neg pulmonary ROS, former smoker,    Pulmonary exam normal breath sounds clear to auscultation       Cardiovascular Exercise Tolerance: Good hypertension, negative cardio ROS  + dysrhythmias Supra Ventricular Tachycardia  Rhythm:regular Rate:Normal     Neuro/Psych PSYCHIATRIC DISORDERS Anxiety Depression negative neurological ROS  negative psych ROS   GI/Hepatic negative GI ROS, Neg liver ROS, GERD  ,  Endo/Other  negative endocrine ROS  Renal/GU negative Renal ROS  negative genitourinary   Musculoskeletal   Abdominal   Peds  Hematology negative hematology ROS (+) anemia ,   Anesthesia Other Findings 12 lead NSR 91, metoprolol for SVT FBS 92 [place] obesity  Reproductive/Obstetrics negative OB ROS                             Anesthesia Physical Anesthesia Plan  ASA: III  Anesthesia Plan: General   Post-op Pain Management:    Induction:   PONV Risk Score and Plan:   Airway Management Planned:   Additional Equipment:   Intra-op Plan:   Post-operative Plan:   Informed Consent: I have reviewed the patients History and Physical, chart, labs and discussed the procedure including the risks, benefits and alternatives for the proposed anesthesia with the patient or authorized representative who has indicated his/her understanding and acceptance.   Dental Advisory Given  Plan Discussed with: CRNA and Anesthesiologist  Anesthesia Plan Comments:         Anesthesia Quick Evaluation

## 2017-08-16 NOTE — Anesthesia Postprocedure Evaluation (Signed)
Anesthesia Post Note  Patient: Donna Long  Procedure(s) Performed: DILATATION & CURETTAGE/HYSTEROSCOPY WITH MINERVA  ENDOMETRIAL ABLATION (N/A )  Patient location during evaluation: PACU Anesthesia Type: General Level of consciousness: awake and alert Pain management: pain level controlled Vital Signs Assessment: post-procedure vital signs reviewed and stable Respiratory status: spontaneous breathing Cardiovascular status: blood pressure returned to baseline Postop Assessment: no apparent nausea or vomiting Anesthetic complications: no     Last Vitals:  Vitals:   08/16/17 1129  BP: 122/68  Pulse: 68  Resp: 18  Temp: 36.9 C  SpO2: 100%    Last Pain:  Vitals:   08/16/17 1129  TempSrc: Oral  PainSc: 2                  Mert Dietrick

## 2017-08-16 NOTE — Discharge Instructions (Signed)
Endometrial Ablation Endometrial ablation is a procedure that destroys the thin inner layer of the lining of the uterus (endometrium). This procedure may be done:  To stop heavy periods.  To stop bleeding that is causing anemia.  To control irregular bleeding.  To treat bleeding caused by small tumors (fibroids) in the endometrium.  This procedure is often an alternative to major surgery, such as removal of the uterus and cervix (hysterectomy). As a result of this procedure:  You may not be able to have children. However, if you are premenopausal (you have not gone through menopause): ? You may still have a small chance of getting pregnant. ? You will need to use a reliable method of birth control after the procedure to prevent pregnancy.  You may stop having a menstrual period, or you may have only a small amount of bleeding during your period. Menstruation may return several years after the procedure.  Tell a health care provider about:  Any allergies you have.  All medicines you are taking, including vitamins, herbs, eye drops, creams, and over-the-counter medicines.  Any problems you or family members have had with the use of anesthetic medicines.  Any blood disorders you have.  Any surgeries you have had.  Any medical conditions you have. What are the risks? Generally, this is a safe procedure. However, problems may occur, including:  A hole (perforation) in the uterus or bowel.  Infection of the uterus, bladder, or vagina.  Bleeding.  Damage to other structures or organs.  An air bubble in the lung (air embolus).  Problems with pregnancy after the procedure.  Failure of the procedure.  Decreased ability to diagnose cancer in the endometrium.  What happens before the procedure?  You will have tests of your endometrium to make sure there are no pre-cancerous cells or cancer cells present.  You may have an ultrasound of the uterus.  You may be given  medicines to thin the endometrium.  Ask your health care provider about: ? Changing or stopping your regular medicines. This is especially important if you take diabetes medicines or blood thinners. ? Taking medicines such as aspirin and ibuprofen. These medicines can thin your blood. Do not take these medicines before your procedure if your doctor tells you not to.  Plan to have someone take you home from the hospital or clinic. What happens during the procedure?  You will lie on an exam table with your feet and legs supported as in a pelvic exam.  To lower your risk of infection: ? Your health care team will wash or sanitize their hands and put on germ-free (sterile) gloves. ? Your genital area will be washed with soap.  An IV tube will be inserted into one of your veins.  You will be given a medicine to help you relax (sedative).  A surgical instrument with a light and camera (resectoscope) will be inserted into your vagina and moved into your uterus. This allows your surgeon to see inside your uterus.  Endometrial tissue will be removed using one of the following methods: ? Radiofrequency. This method uses a radiofrequency-alternating electric current to remove the endometrium. ? Cryotherapy. This method uses extreme cold to freeze the endometrium. ? Heated-free liquid. This method uses a heated saltwater (saline) solution to remove the endometrium. ? Microwave. This method uses high-energy microwaves to heat up the endometrium and remove it. ? Thermal balloon. This method involves inserting a catheter with a balloon tip into the uterus. The balloon tip is   filled with heated fluid to remove the endometrium. The procedure may vary among health care providers and hospitals. What happens after the procedure?  Your blood pressure, heart rate, breathing rate, and blood oxygen level will be monitored until the medicines you were given have worn off.  As tissue healing occurs, you may  notice vaginal bleeding for 4-6 weeks after the procedure. You may also experience: ? Cramps. ? Thin, watery vaginal discharge that is light pink or brown in color. ? A need to urinate more frequently than usual. ? Nausea.  Do not drive for 24 hours if you were given a sedative.  Do not have sex or insert anything into your vagina until your health care provider approves. Summary  Endometrial ablation is done to treat the many causes of heavy menstrual bleeding.  The procedure may be done only after medications have been tried to control the bleeding.  Plan to have someone take you home from the hospital or clinic. This information is not intended to replace advice given to you by your health care provider. Make sure you discuss any questions you have with your health care provider. Document Released: 12/04/2003 Document Revised: 02/11/2016 Document Reviewed: 02/11/2016 Elsevier Interactive Patient Education  2017 [employer]  

## 2017-08-16 NOTE — H&P (Signed)
Preoperative History and Physical  Donna RoysLisa Long is a 47 y.o. G4P4 with No LMP recorded. admitted for a hysteroscopy uterine curettage minerva endometrial ablation.  Bleeding stopped on megestrol but the cramps have continued, maybe worse  PMH:        Past Medical History:  Diagnosis Date  . Anemia   . Anxiety   . Back injury   . Depression   . GERD (gastroesophageal reflux disease)   . Hypertension   . SVT (supraventricular tachycardia) (HCC)     PSH:          Past Surgical History:  Procedure Laterality Date  . TUBAL LIGATION    . WRIST SURGERY Right     POb/GynH:              OB History    Gravida  4   Para  4   Term      Preterm      AB      Living  4     SAB      TAB      Ectopic      Multiple      Live Births              SH:   Social History        Tobacco Use  . Smoking status: Former Smoker    Packs/day: 1.00    Years: 33.00    Pack years: 33.00    Types: Cigarettes    Last attempt to quit: 04/02/2008    Years since quitting: 9.3  . Smokeless tobacco: Never Used  Substance Use Topics  . Alcohol use: No  . Drug use: No    FH:    Family History  Problem Relation Age of Onset  . Hypertension Mother   . Heart disease Mother   . COPD Father   . Hypertension Maternal Aunt   . Diabetes Maternal Grandmother   . Kidney disease Sister      Allergies:      Allergies  Allergen Reactions  . Clindamycin/Lincomycin Rash    Medications:       Current Outpatient Medications:  .  ibuprofen (ADVIL,MOTRIN) 800 MG tablet, Take 1 tablet (800 mg total) by mouth every 8 (eight) hours as needed for fever or moderate pain., Disp: 21 tablet, Rfl: 0 .  megestrol (MEGACE) 40 MG tablet, 3 tablets a day for 5 days, 2 tablets a day for 5 days then 1 tablet daily, Disp: 45 tablet, Rfl: 3 .  acetaminophen (TYLENOL) 500 MG tablet, Take 500 mg by mouth 2 (two) times daily as needed (pain). , Disp: ,  Rfl:  .  IRON PO, Take 1 tablet by mouth daily. , Disp: , Rfl:  .  ketorolac (TORADOL) 10 MG tablet, Take 1 tablet (10 mg total) by mouth every 8 (eight) hours as needed. (Patient not taking: Reported on 07/31/2017), Disp: 15 tablet, Rfl: 0 .  lidocaine (LIDODERM) 5 %, Place 1 patch onto the skin daily. Remove & Discard patch within 12 hours or as directed by MD (Patient not taking: Reported on 05/10/2017), Disp: 30 patch, Rfl: 0 .  metoprolol succinate (TOPROL-XL) 50 MG 24 hr tablet, Take 1 tablet (50 mg total) by mouth daily. Take with or immediately following a meal. (Patient not taking: Reported on 05/10/2017), Disp: 30 tablet, Rfl: 0  Review of Systems:   Review of Systems  Constitutional: Negative for fever, chills, weight loss, malaise/fatigue and diaphoresis.  HENT:  Negative for hearing loss, ear pain, nosebleeds, congestion, sore throat, neck pain, tinnitus and ear discharge.   Eyes: Negative for blurred vision, double vision, photophobia, pain, discharge and redness.  Respiratory: Negative for cough, hemoptysis, sputum production, shortness of breath, wheezing and stridor.   Cardiovascular: Negative for chest pain, palpitations, orthopnea, claudication, leg swelling and PND.  Gastrointestinal: Positive for abdominal pain. Negative for heartburn, nausea, vomiting, diarrhea, constipation, blood in stool and melena.  Genitourinary: Negative for dysuria, urgency, frequency, hematuria and flank pain.  Musculoskeletal: Negative for myalgias, back pain, joint pain and falls.  Skin: Negative for itching and rash.  Neurological: Negative for dizziness, tingling, tremors, sensory change, speech change, focal weakness, seizures, loss of consciousness, weakness and headaches.  Endo/Heme/Allergies: Negative for environmental allergies and polydipsia. Does not bruise/bleed easily.  Psychiatric/Behavioral: Negative for depression, suicidal ideas, hallucinations, memory loss and substance abuse. The  patient is not nervous/anxious and does not have insomnia.      PHYSICAL EXAM:  Blood pressure (!) 147/87, pulse 73, weight 291 lb (132 kg).    Vitals reviewed. Constitutional: She is oriented to person, place, and time. She appears well-developed and well-nourished.  HENT:  Head: Normocephalic and atraumatic.  Right Ear: External ear normal.  Left Ear: External ear normal.  Nose: Nose normal.  Mouth/Throat: Oropharynx is clear and moist.  Eyes: Conjunctivae and EOM are normal. Pupils are equal, round, and reactive to light. Right eye exhibits no discharge. Left eye exhibits no discharge. No scleral icterus.  Neck: Normal range of motion. Neck supple. No tracheal deviation present. No thyromegaly present.  Cardiovascular: Normal rate, regular rhythm, normal heart sounds and intact distal pulses.  Exam reveals no gallop and no friction rub.   No murmur heard. Respiratory: Effort normal and breath sounds normal. No respiratory distress. She has no wheezes. She has no rales. She exhibits no tenderness.  GI: Soft. Bowel sounds are normal. She exhibits no distension and no mass. There is tenderness. There is no rebound and no guarding.  Genitourinary:       Vulva is normal without lesions Vagina is pink moist without discharge Cervix normal in appearance and pap is normal Uterus is normal size, contour, position, consistency, mobility, non-tender Adnexa is negative with normal sized ovaries by sonogram  Musculoskeletal: Normal range of motion. She exhibits no edema and no tenderness.  Neurological: She is alert and oriented to person, place, and time. She has normal reflexes. She displays normal reflexes. No cranial nerve deficit. She exhibits normal muscle tone. Coordination normal.  Skin: Skin is warm and dry. No rash noted. No erythema. No pallor.  Psychiatric: She has a normal mood and affect. Her behavior is normal. Judgment and thought content normal.    Labs: Results for  orders placed or performed during the hospital encounter of 08/16/17 (from the past 336 hour(s))  Urinalysis, Routine w reflex microscopic   Collection Time: 08/16/17 11:01 AM  Result Value Ref Range   Color, Urine YELLOW YELLOW   APPearance CLEAR CLEAR   Specific Gravity, Urine 1.012 1.005 - 1.030   pH 6.0 5.0 - 8.0   Glucose, UA NEGATIVE NEGATIVE mg/dL   Hgb urine dipstick SMALL (A) NEGATIVE   Bilirubin Urine NEGATIVE NEGATIVE   Ketones, ur NEGATIVE NEGATIVE mg/dL   Protein, ur NEGATIVE NEGATIVE mg/dL   Nitrite NEGATIVE NEGATIVE   Leukocytes, UA NEGATIVE NEGATIVE   RBC / HPF 0-5 0 - 5 RBC/hpf   WBC, UA 0-5 0 - 5 WBC/hpf  Bacteria, UA NONE SEEN NONE SEEN   Squamous Epithelial / LPF 0-5 0 - 5   Mucus PRESENT   Glucose, capillary   Collection Time: 08/16/17 12:08 PM  Result Value Ref Range   Glucose-Capillary 92 70 - 99 mg/dL  Results for orders placed or performed during the hospital encounter of 08/09/17 (from the past 336 hour(s))  CBC   Collection Time: 08/09/17  1:09 PM  Result Value Ref Range   WBC 13.0 (H) 4.0 - 10.5 K/uL   RBC 4.85 3.87 - 5.11 MIL/uL   Hemoglobin 11.9 (L) 12.0 - 15.0 g/dL   HCT 95.2 84.1 - 32.4 %   MCV 78.4 78.0 - 100.0 fL   MCH 24.5 (L) 26.0 - 34.0 pg   MCHC 31.3 30.0 - 36.0 g/dL   RDW 40.1 (H) 02.7 - 25.3 %   Platelets 374 150 - 400 K/uL  Comprehensive metabolic panel   Collection Time: 08/09/17  1:09 PM  Result Value Ref Range   Sodium 138 135 - 145 mmol/L   Potassium 3.4 (L) 3.5 - 5.1 mmol/L   Chloride 109 98 - 111 mmol/L   CO2 19 (L) 22 - 32 mmol/L   Glucose, Bld 189 (H) 70 - 99 mg/dL   BUN 10 6 - 20 mg/dL   Creatinine, Ser 6.64 0.44 - 1.00 mg/dL   Calcium 8.9 8.9 - 40.3 mg/dL   Total Protein 7.1 6.5 - 8.1 g/dL   Albumin 3.6 3.5 - 5.0 g/dL   AST 17 15 - 41 U/L   ALT 19 0 - 44 U/L   Alkaline Phosphatase 87 38 - 126 U/L   Total Bilirubin 0.4 0.3 - 1.2 mg/dL   GFR calc non Af Amer >60 >60 mL/min   GFR calc Af Amer >60 >60 mL/min    Anion gap 10 5 - 15  hCG, quantitative, pregnancy   Collection Time: 08/09/17  1:09 PM  Result Value Ref Range   hCG, Beta Chain, Quant, S <1 <5 mIU/mL  Rapid HIV screen (HIV 1/2 Ab+Ag)   Collection Time: 08/09/17  1:09 PM  Result Value Ref Range   HIV-1 P24 Antigen - HIV24 NON REACTIVE NON REACTIVE   HIV 1/2 Antibodies NON REACTIVE NON REACTIVE   Interpretation (HIV Ag Ab)      A non reactive test result means that HIV 1 or HIV 2 antibodies and HIV 1 p24 antigen were not detected in the specimen.     EKG:    Orders placed or performed in visit on 10/06/16  . EKG 12-Lead    Imaging Studies:  Imaging Results  US Pelvic Complete With Transvaginal  Result Date: 07/28/2017 CLINICAL DATA:  Dysmenorrhea and metrorrhagia for 3 years. Tubal ligation. LMP 02/07/2017. EXAM: TRANSABDOMINAL AND TRANSVAGINAL ULTRASOUND OF PELVIS TECHNIQUE: Both transabdominal and transvaginal ultrasound examinations of the pelvis were performed. Transabdominal technique was performed for global imaging of the pelvis including uterus, ovaries, adnexal regions, and pelvic cul-de-sac. It was necessary to proceed with endovaginal exam following the transabdominal exam to visualize the ovaries. COMPARISON:  None FINDINGS: Uterus Measurements: 11.1 x 4.7 x 6.6 centimeters. No fibroids or other mass visualized. Endometrium Thickness: 9.7 millimeters. Small amount of fluid is noted in the endometrial canal. Right ovary Measurements: 2.5 x 2.3 x 2.0 centimeter. Normal appearance/no adnexal mass. Left ovary Measurements: The ovary is not visualized, either absent or obscured. No adnexal mass. Other findings No abnormal free fluid. IMPRESSION: 1. Normal endometrial thickness. If bleeding remains unresponsive to  hormonal or medical therapy, sonohysterogram should be considered for focal lesion work-up. (Ref: Radiological Reasoning: Algorithmic Workup of Abnormal Vaginal Bleeding with Endovaginal Sonography and  Sonohysterography. AJR 2008; 409:[id number]) 2. No uterine or endometrial mass. 3. Normal appearance the RIGHT ovary; nonvisualized LEFT ovary. Electronically Signed   By: Norva Pavlov M.D.   On: 07/28/2017 08:35       Assessment: Menometrorrhagia dysmenorrhea     Patient Active Problem List   Diagnosis Date Noted  . 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 Anemia 11/04/2016  . Anxiety 11/04/2016    Plan: Hysteroscopy uterine curettage Mirena endometrial ablation  Lazaro Arms 07/31/2017 3:33 PM

## 2017-08-16 NOTE — Interval H&P Note (Signed)
History and Physical Interval Note:  08/16/2017 12:47 PM  Donna RoysLisa Long  has presented today for surgery, with the diagnosis of Menometrorrhagia Dysmenorrhea  The various methods of treatment have been discussed with the patient and family. After consideration of risks, benefits and other options for treatment, the patient has consented to  Procedure(s): DILATATION & CURETTAGE/HYSTEROSCOPY WITH MINERVA ABLATION (N/A) as a surgical intervention .  The patient's history has been reviewed, patient examined, no change in status, stable for surgery.  I have reviewed the patient's chart and labs.  Questions were answered to the patient's satisfaction.     Lazaro ArmsLuther H [place] Folden

## 2017-08-16 NOTE — Transfer of Care (Signed)
Immediate Anesthesia Transfer of Care Note  Patient: Donna RoysLisa Long  Procedure(s) Performed: DILATATION & CURETTAGE/HYSTEROSCOPY WITH MINERVA  ENDOMETRIAL ABLATION (N/A )  Patient Location: PACU  Anesthesia Type:General  Level of Consciousness: awake, alert  and oriented  Airway & Oxygen Therapy: Patient Spontanous Breathing  Post-op Assessment: Report given to RN and Post -op Vital signs reviewed and stable  Post vital signs: Reviewed and stable  Last Vitals:  Vitals Value Taken Time  BP 156/99 08/16/2017  1:41 PM  Temp    Pulse 73 08/16/2017  1:42 PM  Resp 16 08/16/2017  1:42 PM  SpO2 97 % 08/16/2017  1:42 PM  Vitals shown include unvalidated device data.  Last Pain:  Vitals:   08/16/17 1129  TempSrc: Oral  PainSc: 2          Complications: No apparent anesthesia complications

## 2017-08-16 NOTE — Op Note (Signed)
Preoperative diagnosis:  1.   menometrorrhagia                                         2.  dysmenorrhea   Postoperative diagnoses: Same as above   Procedure: Hysteroscopy, uterine curettage, endometrial ablation using Minerva  Surgeon: Lazaro ArmsLuther H Eure   Anesthesia: Laryngeal mask airway  Findings: The endometrium was normal. There were no fibroid or other abnormalities.  Description of operation: The patient was taken to the operating room and placed in the supine position. She underwent general anesthesia using the laryngeal mask airway. She was placed in the dorsal lithotomy position and prepped and draped in the usual sterile fashion. A Graves speculum was placed and the anterior cervical lip was grasped with a single-tooth tenaculum. The cervix was dilated serially to allow passage of the hysteroscope. Diagnostic hysteroscopy was performed and was found to be normal. A vigorous uterine curettage was then performed and all tissue sent to pathology for evaluation.  I then proceeded to perform the Minerva endometrial ablation.   The uterus sounded to 9.5 cm The handpiece was attached to the Minerva power source/machine and the handpiece passed the checklist. The array was squeezed down to remove all of the air present.  The array was then place into the endometrial cavity and deployed to a length of 6.5 cm. The handpiece confirmed appropriate width by being in the green portion of the visual dial. The cervical cuff was then inflated to the point the CO2 indicator was in the green. The endometrial integrity check was then performed and integrity sequence was confirmed x 2. The heating was then begun and carried out for a total of 2 minutes(which is standard therapy time). When the plasma cycle was finished,  the cervical cuff was deflated and the array was removed with tissue present on the silicon membrane. There was appropriate post Minerva bleeding and uterine discharge.     All of the  equipment worked well throughout the procedure.  The patient was awakened from anesthesia and taken to the recovery room in good stable condition all counts were correct. She received 2 g of Ancef and 30 mg of Toradol preoperatively. She will be discharged from the recovery room and followed up in the office in 1- 2 weeks.   She can expect 4 weeks of post procedure bloody watery discharge  Lazaro ArmsLuther H Eure, MD   08/16/2017 1:37 PM

## 2017-08-18 ENCOUNTER — Encounter (HOSPITAL_COMMUNITY): Payer: Self-pay | Admitting: Obstetrics & Gynecology

## 2017-08-23 ENCOUNTER — Ambulatory Visit: Payer: Self-pay | Admitting: Physician Assistant

## 2017-08-23 ENCOUNTER — Encounter: Payer: Self-pay | Admitting: Physician Assistant

## 2017-08-23 VITALS — BP 118/60 | HR 76 | Temp 97.9°F | Ht 66.5 in | Wt 292.5 lb

## 2017-08-23 DIAGNOSIS — K0889 Other specified disorders of teeth and supporting structures: Secondary | ICD-10-CM

## 2017-08-23 MED ORDER — AMOXICILLIN 500 MG PO CAPS: 500.0000 mg | ORAL_CAPSULE | Freq: Three times a day (TID) | ORAL | 0 refills | Status: DC

## 2017-08-23 NOTE — Progress Notes (Signed)
BP 118/60 (BP Location: Left Arm, Patient Position: Sitting, Cuff Size: Normal)   Pulse 76   Temp 97.9 F (36.6 C)   Ht 5' 6.5" (1.689 m)   Wt 292 lb 8 oz (132.7 kg)   SpO2 98%   BMI 46.50 kg/m    Subjective:    Patient ID: Donna Long, female    DOB: 1970/12/25, 47 y.o.   MRN: [id number]  HPI: Donna Long is a 47 y.o. female presenting on 08/23/2017 for Dental Pain (R Lower. pt states she feels her whole face is swollen. pt has been taking tylenol but has not been helpful)   HPI   Chief Complaint  Patient presents with  . Dental Pain    R Lower. pt states she feels her whole face is swollen. pt has been taking tylenol but has not been helpful     Pt says she had the tooth filled but thinks she needs a root canal.  She thinks it is infected.  She got it filled about 3 weeks ago.   She has [facility] but she hasn't been scheduled yet.   Relevant past medical, surgical, family and social history reviewed and updated as indicated. Interim medical history since our last visit reviewed. Allergies and medications reviewed and updated.  CURRENT MEDS: none  Review of Systems  Constitutional: Positive for fatigue. Negative for appetite change, chills, diaphoresis, fever and unexpected weight change.  HENT: Positive for dental problem and facial swelling. Negative for congestion, drooling, ear pain, hearing loss, mouth sores, sneezing, sore throat, trouble swallowing and voice change.   Eyes: Positive for itching. Negative for pain, discharge, redness and visual disturbance.  Respiratory: Negative for cough, choking, shortness of breath and wheezing.   Cardiovascular: Positive for palpitations and leg swelling. Negative for chest pain.  Gastrointestinal: Negative for abdominal pain, blood in stool, constipation, diarrhea and vomiting.  Endocrine: Positive for polydipsia. Negative for cold intolerance and heat intolerance.  Genitourinary:  Negative for decreased urine volume, dysuria and hematuria.  Musculoskeletal: Negative for arthralgias, back pain and gait problem.  Skin: Negative for rash.  Allergic/Immunologic: Negative for environmental allergies.  Neurological: Positive for light-headedness and headaches. Negative for seizures and syncope.  Hematological: Positive for adenopathy.  Psychiatric/Behavioral: Negative for agitation, dysphoric mood and suicidal ideas. The patient is not nervous/anxious.     Per HPI unless specifically indicated above     Objective:    BP 118/60 (BP Location: Left Arm, Patient Position: Sitting, Cuff Size: Normal)   Pulse 76   Temp 97.9 F (36.6 C)   Ht 5' 6.5" (1.689 m)   Wt 292 lb 8 oz (132.7 kg)   SpO2 98%   BMI 46.50 kg/m   Wt Readings from Last 3 Encounters:  08/23/17 292 lb 8 oz (132.7 kg)  08/16/17 293 lb (132.9 kg)  08/09/17 293 lb (132.9 kg)    Physical Exam  Constitutional: She is oriented to person, place, and time. She appears well-developed and well-nourished.  HENT:  Head: Normocephalic and atraumatic.  Mouth/Throat: Uvula is midline. Abnormal dentition. Dental caries present. No dental abscesses or uvula swelling.  No facial swelling  Neck: Neck supple.  Pulmonary/Chest: Effort normal. No respiratory distress.  Neurological: She is alert and oriented to person, place, and time.  Skin: Skin is warm and dry.  Psychiatric: She has a normal mood and affect. Her behavior is normal.  Nursing note and vitals reviewed.  Assessment & Plan:   Encounter Diagnosis  Name Primary?  Norva Riffle Yes    -rx amoxil -Pt to follow up with dentist/put on dental list -Pt to follow up here next month as scheduled for routine appointment.  RTO sooner prn

## 2017-08-24 ENCOUNTER — Encounter: Payer: Self-pay | Admitting: Obstetrics & Gynecology

## 2017-09-04 ENCOUNTER — Encounter: Payer: Self-pay | Admitting: Obstetrics & Gynecology

## 2017-09-04 ENCOUNTER — Ambulatory Visit (INDEPENDENT_AMBULATORY_CARE_PROVIDER_SITE_OTHER): Payer: Self-pay | Admitting: Obstetrics & Gynecology

## 2017-09-04 VITALS — BP 120/90 | Wt 292.0 lb

## 2017-09-04 DIAGNOSIS — Z9889 Other specified postprocedural states: Secondary | ICD-10-CM

## 2017-09-04 MED ORDER — HYDROCODONE-ACETAMINOPHEN 5-325 MG PO TABS: 1.0000 | ORAL_TABLET | Freq: Four times a day (QID) | ORAL | 0 refills | Status: DC | PRN

## 2017-09-04 MED ORDER — KETOROLAC TROMETHAMINE 10 MG PO TABS: 10.0000 mg | ORAL_TABLET | Freq: Three times a day (TID) | ORAL | 0 refills | Status: DC | PRN

## 2017-09-04 MED ORDER — DOXYCYCLINE HYCLATE 100 MG PO TABS: 100.0000 mg | ORAL_TABLET | Freq: Two times a day (BID) | ORAL | 0 refills | Status: DC

## 2017-09-04 NOTE — Progress Notes (Signed)
  HPI: Patient returns for routine postoperative follow-up having undergone hysteroscopy uterine curettage minerva ablation 08/16/2017  The patient's immediate postoperative recovery has been unremarkable. Since hospital discharge the patient reports about 1 week post op started with heavy bleeding aagain with severe cramping.   Current Outpatient Medications: HYDROcodone-acetaminophen (NORCO/VICODIN) 5-325 MG tablet, Take 1 tablet by mouth every 6 (six) hours as needed. (Patient not taking: Reported on 08/23/2017), Disp: 15 tablet, Rfl: 0 ketorolac (TORADOL) 10 MG tablet, Take 1 tablet (10 mg total) by mouth every 8 (eight) hours as needed. (Patient not taking: Reported on 08/23/2017), Disp: 15 tablet, Rfl: 0 metoprolol tartrate (LOPRESSOR) 25 MG tablet, Take 1 tablet (25 mg total) by mouth 2 (two) times daily. (Patient not taking: Reported on 08/23/2017), Disp: 60 tablet, Rfl: 1 promethazine (PHENERGAN) 25 MG tablet, Take 1 tablet (25 mg total) by mouth every 6 (six) hours as needed for nausea. (Patient not taking: Reported on 08/23/2017), Disp: 30 tablet, Rfl: 1  No current facility-administered medications for this visit.     Blood pressure 120/90, weight 292 lb (132.5 kg).  Physical Exam: Vagina with blood in vault tender to exam  Uterus tender to exam  Diagnostic Tests:   Pathology: negative  Impression: S/p endometrial ablation With severe post op course, my guess she had a significantly deeper level of tissue destruction from the ablation  Plan:  Meds ordered this encounter  Medications  . doxycycline (VIBRA-TABS) 100 MG tablet    Sig: Take 1 tablet (100 mg total) by mouth 2 (two) times daily.    Dispense:  20 tablet    Refill:  0  . HYDROcodone-acetaminophen (NORCO/VICODIN) 5-325 MG tablet    Sig: Take 1 tablet by mouth every 6 (six) hours as needed.    Dispense:  20 tablet    Refill:  0  . ketorolac (TORADOL) 10 MG tablet    Sig: Take 1 tablet (10 mg total) by mouth  every 8 (eight) hours as needed.    Dispense:  15 tablet    Refill:  0     Follow up: 2  weeks  Lazaro ArmsLuther H Eure, MD

## 2017-09-18 ENCOUNTER — Other Ambulatory Visit (HOSPITAL_COMMUNITY)
Admission: RE | Admit: 2017-09-18 | Discharge: 2017-09-18 | Disposition: A | Discharge status: Home / Self Care | Payer: Self-pay | Source: Ambulatory Visit | Attending: Physician Assistant | Admitting: Physician Assistant

## 2017-09-18 ENCOUNTER — Encounter: Payer: Self-pay | Admitting: Obstetrics & Gynecology

## 2017-09-18 ENCOUNTER — Other Ambulatory Visit: Payer: Self-pay

## 2017-09-18 ENCOUNTER — Ambulatory Visit: Payer: Self-pay | Admitting: Physician Assistant

## 2017-09-18 ENCOUNTER — Ambulatory Visit (INDEPENDENT_AMBULATORY_CARE_PROVIDER_SITE_OTHER): Payer: Self-pay | Admitting: Obstetrics & Gynecology

## 2017-09-18 ENCOUNTER — Encounter: Payer: Self-pay | Admitting: Physician Assistant

## 2017-09-18 VITALS — BP 117/68 | HR 77 | Ht 66.5 in | Wt 298.0 lb

## 2017-09-18 VITALS — BP 120/75 | HR 82 | Temp 98.1°F | Ht 66.5 in | Wt 296.5 lb

## 2017-09-18 DIAGNOSIS — Z91199 Patient's noncompliance with other medical treatment and regimen due to unspecified reason: Secondary | ICD-10-CM

## 2017-09-18 DIAGNOSIS — J329 Chronic sinusitis, unspecified: Secondary | ICD-10-CM

## 2017-09-18 DIAGNOSIS — Z9119 Patient's noncompliance with other medical treatment and regimen: Secondary | ICD-10-CM

## 2017-09-18 DIAGNOSIS — R002 Palpitations: Secondary | ICD-10-CM | POA: Insufficient documentation

## 2017-09-18 DIAGNOSIS — Z9889 Other specified postprocedural states: Secondary | ICD-10-CM

## 2017-09-18 DIAGNOSIS — R609 Edema, unspecified: Secondary | ICD-10-CM

## 2017-09-18 DIAGNOSIS — E876 Hypokalemia: Secondary | ICD-10-CM | POA: Insufficient documentation

## 2017-09-18 DIAGNOSIS — N63 Unspecified lump in unspecified breast: Secondary | ICD-10-CM

## 2017-09-18 LAB — BASIC METABOLIC PANEL
ANION GAP: 5 (ref 5–15)
BUN: 12 mg/dL (ref 6–20)
CALCIUM: 8.8 mg/dL — AB (ref 8.9–10.3)
CO2: 28 mmol/L (ref 22–32)
Chloride: 107 mmol/L (ref 98–111)
Creatinine, Ser: 0.71 mg/dL (ref 0.44–1.00)
Glucose, Bld: 77 mg/dL (ref 70–99)
POTASSIUM: 4.2 mmol/L (ref 3.5–5.1)
Sodium: 140 mmol/L (ref 135–145)

## 2017-09-18 MED ORDER — HYDROCHLOROTHIAZIDE 12.5 MG PO CAPS: 12.5000 mg | ORAL_CAPSULE | Freq: Every day | ORAL | 1 refills | Status: DC

## 2017-09-18 NOTE — Progress Notes (Signed)
  HPI: Patient returns for routine postoperative follow-up having undergone hysteroscopy uterine curettage minerva ablation on 08/16/2017.  The patient's immediate postoperative recovery has been unremarkable. Since hospital discharge the patient reports feels much better.   Current Outpatient Medications: metoprolol tartrate (LOPRESSOR) 25 MG tablet, Take 1 tablet (25 mg total) by mouth 2 (two) times daily. (Patient not taking: Reported on 08/23/2017), Disp: 60 tablet, Rfl: 1  No current facility-administered medications for this visit.     Blood pressure 117/68, pulse 77, height 5' 6.5" (1.689 m), weight 298 lb (135.2 kg).  Physical Exam: Much less pain on exam No blood in vault  Diagnostic Tests:   Pathology: benign  Impression: S/p endo ablation with deep level thermal injury  Plan: No sex for 2 weeks  Follow up: 1  years  Donna ArmsLuther H Luiza Carranco, MD

## 2017-09-18 NOTE — Patient Instructions (Signed)
Edema Edema is when you have too much fluid in your body or under your skin. Edema may make your legs, feet, and ankles swell up. Swelling is also common in looser tissues, like around your eyes. This is a common condition. It gets more common as you get older. There are many possible causes of edema. Eating too much salt (sodium) and being on your feet or sitting for a long time can cause edema in your legs, feet, and ankles. Hot weather may make edema worse. Edema is usually painless. Your skin may look swollen or shiny. Follow these instructions at home:  Keep the swollen body part raised (elevated) above the level of your heart when you are sitting or lying down.  Do not sit still or stand for a long time.  Do not wear tight clothes. Do not wear garters on your upper legs.  Exercise your legs. This can help the swelling go down.  Wear elastic bandages or support stockings as told by your doctor.  Eat a low-salt (low-sodium) diet to reduce fluid as told by your doctor.  Depending on the cause of your swelling, you may need to limit how much fluid you drink (fluid restriction).  Take over-the-counter and prescription medicines only as told by your doctor. Contact a doctor if:  Treatment is not working.  You have heart, liver, or kidney disease and have symptoms of edema.  You have sudden and unexplained weight gain. Get help right away if:  You have shortness of breath or chest pain.  You cannot breathe when you lie down.  You have pain, redness, or warmth in the swollen areas.  You have heart, liver, or kidney disease and get edema all of a sudden.  You have a fever and your symptoms get worse all of a sudden. Summary  Edema is when you have too much fluid in your body or under your skin.  Edema may make your legs, feet, and ankles swell up. Swelling is also common in looser tissues, like around your eyes.  Raise (elevate) the swollen body part above the level of your  heart when you are sitting or lying down.  Follow your doctor's instructions about diet and how much fluid you can drink (fluid restriction). This information is not intended to replace advice given to you by your health care provider. Make sure you discuss any questions you have with your health care provider. Document Released: 07/13/2007 Document Revised: 02/12/2016 Document Reviewed: 02/12/2016 Else[employer] Interactive Patient Education  2017 [employer]  

## 2017-09-18 NOTE — Progress Notes (Signed)
BP 120/75 (BP Location: Right Arm, Patient Position: Sitting, Cuff Size: Large)   Pulse 82   Temp 98.1 F (36.7 C) (Other (Comment))   Ht 5' 6.5" (1.689 m)   Wt 296 lb 8 oz (134.5 kg)   SpO2 97%   BMI 47.14 kg/m    Subjective:    Patient ID: Donna Long, female    DOB: October 31, 1970, 47 y.o.   MRN: [id number]  HPI: Donna Long is a 47 y.o. female presenting on 09/18/2017 for Sinusitis ("had f/u this morning with Dr Despina HiddenEure re: recent ablation")   HPI   Pt c/o Left leg swelling, worse at night.  She Says the swelling is everywhere, just worse LLE.   Pt says she never  followed up on diagnostic mammogram as recommended   Pt is still thinking about ablation but still thinking about it.  She says the palpitations are not as frequent now.  She says she never went and picked up her metoprolol  Pt has still having R sinus pain.  She has been on several courses of antibiotics recently with no change.  CT face done in December showed no sinus abnormalities  Pt had echo 12/02/16- mild LVH with ef 65-70%.  Normal study  Relevant past medical, surgical, family and social history reviewed and updated as indicated. Interim medical history since our last visit reviewed. Allergies and medications reviewed and updated.  CURRENT MEDS: none  Review of Systems  Constitutional: Positive for fatigue and unexpected weight change. Negative for appetite change, chills, diaphoresis and fever.  HENT: Positive for dental problem and facial swelling. Negative for congestion, drooling, ear pain, hearing loss, mouth sores, sneezing, sore throat, trouble swallowing and voice change.   Eyes: Negative for pain, discharge, redness, itching and visual disturbance.  Respiratory: Negative for cough, choking, shortness of breath and wheezing.   Cardiovascular: Positive for palpitations and leg swelling. Negative for chest pain.  Gastrointestinal: Negative for abdominal pain, blood in stool, constipation, diarrhea and  vomiting.  Endocrine: Positive for polydipsia. Negative for cold intolerance and heat intolerance.  Genitourinary: Negative for decreased urine volume, dysuria and hematuria.  Musculoskeletal: Positive for arthralgias, back pain and gait problem.  Skin: Negative for rash.  Allergic/Immunologic: Negative for environmental allergies.  Neurological: Positive for light-headedness and headaches. Negative for seizures and syncope.  Hematological: Negative for adenopathy.  Psychiatric/Behavioral: Negative for agitation, dysphoric mood and suicidal ideas. The patient is not nervous/anxious.     Per HPI unless specifically indicated above     Objective:    BP 120/75 (BP Location: Right Arm, Patient Position: Sitting, Cuff Size: Large)   Pulse 82   Temp 98.1 F (36.7 C) (Other (Comment))   Ht 5' 6.5" (1.689 m)   Wt 296 lb 8 oz (134.5 kg)   SpO2 97%   BMI 47.14 kg/m   Wt Readings from Last 3 Encounters:  09/18/17 296 lb 8 oz (134.5 kg)  09/18/17 298 lb (135.2 kg)  09/04/17 292 lb (132.5 kg)    Physical Exam  Constitutional: She is oriented to person, place, and time. She appears well-developed and well-nourished.  HENT:  Head: Normocephalic and atraumatic.  Right Ear: Hearing, tympanic membrane, external ear and ear canal normal.  Left Ear: Hearing, tympanic membrane, external ear and ear canal normal.  Nose: Mucosal edema and rhinorrhea present. Right sinus exhibits maxillary sinus tenderness and frontal sinus tenderness. Left sinus exhibits maxillary sinus tenderness and frontal sinus tenderness.  Mouth/Throat: Uvula is midline and oropharynx is  clear and moist. No oropharyngeal exudate.  Neck: Neck supple.  Cardiovascular: Normal rate and regular rhythm.  Pulmonary/Chest: Effort normal and breath sounds normal. She has no wheezes.  Abdominal: Soft. Bowel sounds are normal. She exhibits no mass. There is no hepatosplenomegaly. There is no tenderness.  Musculoskeletal: She exhibits no  edema (trace BLE).  Lymphadenopathy:    She has no cervical adenopathy.  Neurological: She is alert and oriented to person, place, and time.  Skin: Skin is warm and dry.  Psychiatric: She has a normal mood and affect. Her behavior is normal.  Vitals reviewed.       Assessment & Plan:    Encounter Diagnoses  Name Primary?  . Palpitations Yes  . Hypokalemia   . Chronic sinusitis, unspecified location   . Morbid obesity (HCC)   . Breast lump   . Personal history of noncompliance with medical treatment, presenting hazards to health   . Edema, unspecified type     Gave pt [facility] information and encouraged her to call for follow up diagnostic mammogram.  She has contact information  Refer to ent for persistent sinus  [facility] has [facility]  check Bmp today  rx hctz 12.5   f/u 6 wk.  RTO sooner prn

## 2017-09-30 ENCOUNTER — Encounter: Payer: Self-pay | Admitting: Obstetrics & Gynecology

## 2017-10-30 ENCOUNTER — Ambulatory Visit (INDEPENDENT_AMBULATORY_CARE_PROVIDER_SITE_OTHER): Payer: Self-pay | Admitting: Otolaryngology

## 2017-10-30 ENCOUNTER — Ambulatory Visit: Payer: Self-pay | Admitting: Physician Assistant

## 2017-11-09 ENCOUNTER — Ambulatory Visit (INDEPENDENT_AMBULATORY_CARE_PROVIDER_SITE_OTHER): Payer: Self-pay | Admitting: Otolaryngology

## 2017-11-09 ENCOUNTER — Other Ambulatory Visit (INDEPENDENT_AMBULATORY_CARE_PROVIDER_SITE_OTHER): Payer: Self-pay | Admitting: Otolaryngology

## 2017-11-09 DIAGNOSIS — J342 Deviated nasal septum: Secondary | ICD-10-CM

## 2017-11-09 DIAGNOSIS — J320 Chronic maxillary sinusitis: Secondary | ICD-10-CM

## 2017-11-09 DIAGNOSIS — J310 Chronic rhinitis: Secondary | ICD-10-CM

## 2017-11-09 DIAGNOSIS — J343 Hypertrophy of nasal turbinates: Secondary | ICD-10-CM

## 2017-11-09 DIAGNOSIS — R51 Headache: Secondary | ICD-10-CM

## 2017-11-21 ENCOUNTER — Ambulatory Visit (HOSPITAL_COMMUNITY): Payer: Self-pay

## 2017-12-08 ENCOUNTER — Ambulatory Visit (HOSPITAL_COMMUNITY): Payer: Self-pay

## 2017-12-08 ENCOUNTER — Encounter (HOSPITAL_COMMUNITY): Payer: Self-pay

## 2017-12-27 ENCOUNTER — Telehealth (INDEPENDENT_AMBULATORY_CARE_PROVIDER_SITE_OTHER): Payer: Self-pay | Admitting: Orthopaedic Surgery

## 2017-12-27 ENCOUNTER — Encounter: Payer: Self-pay | Admitting: Physician Assistant

## 2017-12-27 ENCOUNTER — Ambulatory Visit: Payer: Self-pay | Admitting: Physician Assistant

## 2017-12-27 VITALS — BP 126/70 | HR 64 | Temp 97.9°F | Ht 66.5 in | Wt 294.5 lb

## 2017-12-27 DIAGNOSIS — J329 Chronic sinusitis, unspecified: Secondary | ICD-10-CM

## 2017-12-27 DIAGNOSIS — Z91199 Patient's noncompliance with other medical treatment and regimen due to unspecified reason: Secondary | ICD-10-CM

## 2017-12-27 DIAGNOSIS — Z9119 Patient's noncompliance with other medical treatment and regimen: Secondary | ICD-10-CM

## 2017-12-27 MED ORDER — PREDNISONE 20 MG PO TABS: 20.0000 mg | ORAL_TABLET | Freq: Two times a day (BID) | ORAL | 0 refills | Status: DC

## 2017-12-27 MED ORDER — AMOXICILLIN 500 MG PO CAPS: 500.0000 mg | ORAL_CAPSULE | Freq: Three times a day (TID) | ORAL | 0 refills | Status: DC

## 2017-12-27 NOTE — Patient Instructions (Signed)
Wed. Dec 11 8am for CT. Arrive at 7:45am at [facility]

## 2017-12-27 NOTE — Progress Notes (Signed)
BP 126/70 (BP Location: Left Arm, Patient Position: Sitting, Cuff Size: Large)   Pulse 64   Temp 97.9 F (36.6 C)   Ht 5' 6.5" (1.689 m)   Wt 294 lb 8 oz (133.6 kg)   SpO2 98%   BMI 46.82 kg/m    Subjective:    Patient ID: Donna Long, female    DOB: [birth year], 47 y.o.   MRN: [id number]  HPI: Donna Long is a 47 y.o. female presenting on 12/27/2017 for Sinusitis (for 3 weeks) and Back Pain   HPI  Pt has been seen multiple times for sinus complaints.  CT December 2018 negative for sinusitis.  Pt was referred to ENT.  Pt was seen by dr Arlana Lindaueoh offic in October- rx prednisone and ordered CT which the pt has not had done.    Pt is months past due for routine appt here.  Pt quite demanding even when discussing that she has not gotten recommended testing nor been seen as recommended for follow up care.   Relevant past medical, surgical, family and social history reviewed and updated as indicated. Interim medical history since our last visit reviewed. Allergies and medications reviewed and updated.   Current Outpatient Medications:  .  hydrochlorothiazide (MICROZIDE) 12.5 MG capsule, Take 1 capsule (12.5 mg total) by mouth daily. (Patient not taking: Reported on 12/27/2017), Disp: 30 capsule, Rfl: 1  Review of Systems  Constitutional: Positive for chills, diaphoresis and fatigue. Negative for appetite change, fever and unexpected weight change.  HENT: Positive for sneezing, sore throat and trouble swallowing. Negative for congestion, drooling, ear pain, facial swelling, hearing loss, mouth sores and voice change.   Eyes: Positive for itching and visual disturbance. Negative for pain, discharge and redness.  Respiratory: Positive for cough and shortness of breath. Negative for choking and wheezing.   Cardiovascular: Positive for palpitations and leg swelling. Negative for chest pain.  Gastrointestinal: Negative for abdominal pain, blood in stool, constipation, diarrhea and vomiting.   Endocrine: Negative for cold intolerance, heat intolerance and polydipsia.  Genitourinary: Negative for decreased urine volume, dysuria and hematuria.  Musculoskeletal: Positive for arthralgias, back pain and gait problem.  Skin: Negative for rash.  Allergic/Immunologic: Negative for environmental allergies.  Neurological: Positive for light-headedness and headaches. Negative for seizures and syncope.  Hematological: Positive for adenopathy.  Psychiatric/Behavioral: Negative for agitation, dysphoric mood and suicidal ideas. The patient is not nervous/anxious.     Per HPI unless specifically indicated above     Objective:    BP 126/70 (BP Location: Left Arm, Patient Position: Sitting, Cuff Size: Large)   Pulse 64   Temp 97.9 F (36.6 C)   Ht 5' 6.5" (1.689 m)   Wt 294 lb 8 oz (133.6 kg)   SpO2 98%   BMI 46.82 kg/m   Wt Readings from Last 3 Encounters:  12/27/17 294 lb 8 oz (133.6 kg)  09/18/17 296 lb 8 oz (134.5 kg)  09/18/17 298 lb (135.2 kg)    Physical Exam  Constitutional: She is oriented to person, place, and time. She appears well-developed and well-nourished.  HENT:  Head: Normocephalic and atraumatic.  Right Ear: Hearing, tympanic membrane, external ear and ear canal normal.  Left Ear: Hearing, tympanic membrane, external ear and ear canal normal.  Nose: Mucosal edema and rhinorrhea present.  Mouth/Throat: Uvula is midline and oropharynx is clear and moist. No oropharyngeal exudate.  Neck: Neck supple.  Cardiovascular: Normal rate and regular rhythm.  Pulmonary/Chest: Effort normal and breath sounds normal. She  has no wheezes.  Abdominal: Soft. Bowel sounds are normal. She exhibits no mass. There is no hepatosplenomegaly. There is no tenderness.  Musculoskeletal: She exhibits no edema.  Lymphadenopathy:    She has no cervical adenopathy.  Neurological: She is alert and oriented to person, place, and time.  Skin: Skin is warm and dry.  Psychiatric: She has a  normal mood and affect. Her behavior is normal.  Vitals reviewed.       Assessment & Plan:    Encounter Diagnoses  Name Primary?  . Chronic sinusitis, unspecified location Yes  . Personal history of noncompliance with medical treatment, presenting hazards to health     -rx amoxil and prednisone -will schedule the CT ordered by dr Suszanne Conners -pt is given another [facility] application -will schedule pt for routing follow up and to recheck current complaints and make sure she returns to ENT if she is still having sinus complaints

## 2017-12-27 NOTE — Telephone Encounter (Signed)
Patient called advised she is having so much pain in her left hip every time she stand up. Patient asked if she can get something for the pain? The number to contact patient is [phone]

## 2017-12-27 NOTE — Telephone Encounter (Signed)
Unable to reach patient. [facility] and prescribed Prednisone. Will wait for return call to advise. Patient will need follow up appointment if no better. She has not been seen in our office since April.

## 2017-12-27 NOTE — Telephone Encounter (Signed)
Use NSAIDS /tylenol.  If not better than can ROV

## 2017-12-27 NOTE — Telephone Encounter (Signed)
Please advise. Patient has not been seen since April 2019.

## 2017-12-28 ENCOUNTER — Telehealth: Payer: Self-pay | Admitting: Student

## 2017-12-28 NOTE — Telephone Encounter (Signed)
Pt called office and left voicemail requesting referral to a pain management doctor.  LPN called and spoke with pt advising her to take prednisone that was given to her at yesterday's OV 12-27-17 and that it should help with her pain. Pt was informed that PA will discuss referrals with pt at f/u appointment on 01-17-18.   Pt then became upset and stated prednisone does nothing for her pain and PA doesn't give her anything that helps. Pt was then reminded that the [facility] not prescribe narcotics. Pt then stated "that's why I need the referral to pain management".  Pt then insisted she was going to go over PA's "head" and go somewhere else. While LPN was trying to offered to transfer [facility] to the [facility] Executive Director so pt can discuss her issues regarding PA, pt hung up the phone.

## 2018-01-09 ENCOUNTER — Emergency Department (HOSPITAL_COMMUNITY)
Admission: EM | Admit: 2018-01-09 | Discharge: 2018-01-09 | Disposition: A | Discharge status: Home / Self Care | Payer: Self-pay | Attending: Emergency Medicine | Admitting: Emergency Medicine

## 2018-01-09 ENCOUNTER — Encounter (HOSPITAL_COMMUNITY): Payer: Self-pay | Admitting: Emergency Medicine

## 2018-01-09 ENCOUNTER — Other Ambulatory Visit: Payer: Self-pay

## 2018-01-09 DIAGNOSIS — I10 Essential (primary) hypertension: Secondary | ICD-10-CM | POA: Insufficient documentation

## 2018-01-09 DIAGNOSIS — N3000 Acute cystitis without hematuria: Secondary | ICD-10-CM | POA: Insufficient documentation

## 2018-01-09 DIAGNOSIS — Z87891 Personal history of nicotine dependence: Secondary | ICD-10-CM | POA: Insufficient documentation

## 2018-01-09 LAB — URINALYSIS, ROUTINE W REFLEX MICROSCOPIC
Bilirubin Urine: NEGATIVE
GLUCOSE, UA: NEGATIVE mg/dL
KETONES UR: NEGATIVE mg/dL
Nitrite: POSITIVE — AB
PROTEIN: 30 mg/dL — AB
RBC / HPF: >50 RBC/hpf — ABNORMAL HIGH (ref 0–5)
Specific Gravity, Urine: 1.02 (ref 1.005–1.030)
pH: 5 (ref 5.0–8.0)

## 2018-01-09 LAB — WET PREP, GENITAL
Clue Cells Wet Prep HPF POC: NONE SEEN
Sperm: NONE SEEN
TRICH WET PREP: NONE SEEN
YEAST WET PREP: NONE SEEN

## 2018-01-09 MED ORDER — CEPHALEXIN 500 MG PO CAPS
500.0000 mg | ORAL_CAPSULE | Freq: Once | ORAL | Status: AC
  Administered 2018-01-09: 500 mg via ORAL
  Filled 2018-01-09: qty 1

## 2018-01-09 MED ORDER — CEPHALEXIN 500 MG PO CAPS: 500.0000 mg | ORAL_CAPSULE | Freq: Two times a day (BID) | ORAL | 0 refills | Status: AC

## 2018-01-09 NOTE — ED Provider Notes (Signed)
[facility] EMERGENCY DEPARTMENT Provider Note   CSN: [id number] Arrival date & time: 01/09/18  1418     History   Chief Complaint Chief Complaint  Patient presents with  . Urinary Urgency    HPI Donna Long is a 47 y.o. female.  HPI  The patient is a pleasant 47 year old female who unfortunately has a history of some frequent symptoms over the last 5 months since she underwent a procedure (hysteroscopy uterine curettage Minerva ablation on July 10).  She reports that since that time she had developed some urinary symptoms, initially this was a urinary tract infection which she states was very serious but was successfully treated with antibiotics, she has done overall okay since that time and in fact was placed on amoxicillin, and prednisone by family doctor on December 27, 2017, she had a CT scan ordered which should be done within the next week.  She presents today with some lower abdominal discomfort in the suprapubic region which is associated with some burning feeling like she is urinating razor blades, this started approximately 1 week ago but acutely worsened over the last 2 days.  She has frequency and cloudy urine, she states that sometimes she passes what looks like tissue and pus with her urine, denies any incontinence, vaginal bleeding, no increased back pain, no fevers or chills and no nausea or vomiting.  The symptoms are persistent and gradually worsening over the last 48 hours.  Past Medical History:  Diagnosis Date  . Anemia   . Anxiety   . Arthritis   . Back injury   . Chronic back pain   . Depression   . Dysrhythmia    HX SVT  . GERD (gastroesophageal reflux disease)   . Hypertension   . SVT (supraventricular tachycardia) [facility])     Patient Active Problem List   Diagnosis Date Noted  . 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 Anemia 11/04/2016  . Anxiety 11/04/2016    Past Surgical History:  Procedure Laterality Date  . DILITATION &  CURRETTAGE/HYSTROSCOPY WITH NOVASURE ABLATION N/A 08/16/2017   Procedure: DILATATION & CURETTAGE/HYSTEROSCOPY WITH MINERVA  ENDOMETRIAL ABLATION;  Surgeon: Lazaro Arms, MD;  Location: [facility];  Service: Gynecology;  Laterality: N/A;  . TUBAL LIGATION    . WRIST SURGERY Right      OB History    Gravida  4   Para  4   Term      Preterm      AB      Living  4     SAB      TAB      Ectopic      Multiple      Live Births               Home Medications    Prior to Admission medications   Medication Sig Start Date End Date Taking? Authorizing Provider  acetaminophen (TYLENOL) 500 MG tablet Take 500 mg by mouth every 6 (six) hours as needed for mild pain or moderate pain.   Yes [provider]  ibuprofen (ADVIL,MOTRIN) 200 MG tablet Take 800 mg by mouth every 6 (six) hours as needed for mild pain or moderate pain.   Yes [provider]  Multiple Vitamins-Minerals (ALIVE WOMENS GUMMY PO) Take 2 each by mouth daily.   Yes [provider]  cephALEXin (KEFLEX) 500 MG capsule Take 1 capsule (500 mg total) by mouth 2 (two) times daily for 7 days. 01/09/18 01/16/18  Eber Hong, MD    Family History Family History  Problem Relation Age of Onset  . Hypertension Mother   . Heart disease Mother   . COPD Father   . Hypertension Maternal Aunt   . Diabetes Maternal Grandmother   . Kidney disease Sister     Social History Social History   Tobacco Use  . Smoking status: Former Smoker    Packs/day: 1.00    Years: 33.00    Pack years: 33.00    Types: Cigarettes    Last attempt to quit: 04/02/2008    Years since quitting: 9.7  . Smokeless tobacco: Never Used  Substance Use Topics  . Alcohol use: No  . Drug use: No     Allergies   Clindamycin/lincomycin   Review of Systems Review of Systems  All other systems reviewed and are negative.    Physical Exam Updated Vital Signs BP (!) 116/49 (BP Location: Right Arm)   Pulse 67   Temp  98 F (36.7 C) (Oral)   Resp 18   Ht 1.689 m (5' 6.5")   Wt 128.8 kg   SpO2 99%   BMI 45.15 kg/m   Physical Exam  Constitutional: She appears well-developed and well-nourished. No distress.  HENT:  Head: Normocephalic and atraumatic.  Mouth/Throat: Oropharynx is clear and moist. No oropharyngeal exudate.  Eyes: Pupils are equal, round, and reactive to light. Conjunctivae and EOM are normal. Right eye exhibits no discharge. Left eye exhibits no discharge. No scleral icterus.  Neck: Normal range of motion. Neck supple. No JVD present. No thyromegaly present.  Cardiovascular: Normal rate, regular rhythm, normal heart sounds and intact distal pulses. Exam reveals no gallop and no friction rub.  No murmur heard. Pulmonary/Chest: Effort normal and breath sounds normal. No respiratory distress. She has no wheezes. She has no rales.  Abdominal: Soft. Bowel sounds are normal. She exhibits no distension and no mass. There is no tenderness.  Genitourinary:  Genitourinary Comments: Chaperone present for exam, the patient was placed in the supine position, external vaginal tissues appear normal, no swelling redness or asymmetry, no mass lesions.  Her urethra was visualized at the opening, there is no redness discharge swelling or abnormal appearance, the vaginal introitus appears normal, the internal vaginal exam was normal with no masses discharge bleeding or foreign bodies, no leakage of fluid.,  Mild tenderness to the bilateral labia minora without swelling redness induration or fluctuance.  Musculoskeletal: Normal range of motion. She exhibits no edema or tenderness.  Lymphadenopathy:    She has no cervical adenopathy.  Neurological: She is alert. Coordination normal.  Skin: Skin is warm and dry. No rash noted. No erythema.  Psychiatric: She has a normal mood and affect. Her behavior is normal.  Nursing note and vitals reviewed.    ED Treatments / Results  Labs (all labs ordered are listed,  but only abnormal results are displayed) Labs Reviewed  WET PREP, GENITAL - Abnormal; Notable for the following components:      Result Value   WBC, Wet Prep HPF POC FEW (*)    All other components within normal limits  URINALYSIS, ROUTINE W REFLEX MICROSCOPIC - Abnormal; Notable for the following components:   APPearance CLOUDY (*)    Hgb urine dipstick LARGE (*)    Protein, ur 30 (*)    Nitrite POSITIVE (*)    Leukocytes, UA MODERATE (*)    RBC / HPF >50 (*)    WBC, UA >50 (*)  Bacteria, UA MANY (*)    All other components within normal limits  URINE CULTURE    EKG None  Radiology No results found.  Procedures Procedures (including critical care time)  Medications Ordered in ED Medications  cephALEXin (KEFLEX) capsule 500 mg (500 mg Oral Given 01/09/18 1552)     Initial Impression / Assessment and Plan / ED Course  I have reviewed the triage vital signs and the nursing notes.  Pertinent labs & imaging results that were available during my care of the patient were reviewed by me and considered in my medical decision making (see chart for details).  Clinical Course as of Jan 09 1650  Tue Jan 09, 2018  1549 Urinalysis reveals many bacteria, greater than 50 white blood cells, greater than 50 red blood cells, positive nitrite.  Will treat with cephalexin, The patient is aware that a urinary culture was ordered.  Wet prep pending   [BM]  1639 Wet prep normal Pt stable for d;c Reassurance given   [BM]    Clinical Course User Index [BM] Eber HongMiller, Jernard Reiber, MD    The patient has no CVA tenderness, minimal suprapubic tenderness, vaginal exam appears normal but has mild tenderness in the pattern of a vaginitis or urethritis.  She has not been sexually active in 2 years and is recently undergone vaginal testing for STDs which was negative.  At this time she may have a urinary tract infection, culture sent as well given her complicated history.  She is otherwise non-toxic and  non-ill-appearing, anticipate oral antibiotics for positive urinary infection.  Final Clinical Impressions(s) / ED Diagnoses   Final diagnoses:  Acute cystitis without hematuria    ED Discharge Orders         Ordered    cephALEXin (KEFLEX) 500 MG capsule  2 times daily     01/09/18 1640           Eber HongMiller, Jenascia Bumpass, MD 01/09/18 1651

## 2018-01-09 NOTE — ED Triage Notes (Signed)
PT c/o increased urinary frequency, pain with urination and lower pelvic pain x7 days but worsening over 3 days.

## 2018-01-09 NOTE — Discharge Instructions (Signed)
Cephalexin, 500 mg by mouth twice a day for the next 7 days to treat a bladder infection.  Please seek medical exam for increasing pain fever nausea vomiting or back pain. Please see your doctor in 48 hours for recheck and to discuss your urine culture.

## 2018-01-12 LAB — URINE CULTURE: Culture: >=100000 — AB

## 2018-01-13 ENCOUNTER — Telehealth: Payer: Self-pay

## 2018-01-13 NOTE — Telephone Encounter (Signed)
Post ED Visit - Positive Culture Follow-up  Culture report reviewed by antimicrobial stewardship pharmacist:  []  Enzo BiNathan Batchelder, Pharm.D. []  Celedonio MiyamotoJeremy Frens, Pharm.D., BCPS AQ-ID []  Garvin FilaMike Maccia, Pharm.D., BCPS []  Georgina PillionElizabeth Martin, Pharm.D., BCPS []  PerryvilleMinh Pham, VermontPharm.D., BCPS, AAHIVP []  Estella HuskMichelle Turner, Pharm.D., BCPS, AAHIVP []  Lysle Pearlachel Rumbarger, PharmD, BCPS []  Phillips Climeshuy Dang, PharmD, BCPS []  Agapito GamesAlison Masters, PharmD, BCPS []  Verlan FriendsErin Deja, PharmD B [place] Pharm D Positive urine culture Treated with Cephalexin, organism sensitive to the same and no further patient follow-up is required at this time.  Jerry CarasCullom, Zionah Criswell Burnett 01/13/2018, 11:16 AM

## 2018-01-17 ENCOUNTER — Ambulatory Visit: Payer: Self-pay | Admitting: Physician Assistant

## 2018-01-17 ENCOUNTER — Ambulatory Visit (HOSPITAL_COMMUNITY): Payer: Self-pay

## 2018-02-19 ENCOUNTER — Other Ambulatory Visit: Payer: Self-pay | Admitting: Cardiovascular Disease

## 2018-02-20 ENCOUNTER — Other Ambulatory Visit: Payer: Self-pay

## 2018-02-20 ENCOUNTER — Telehealth: Payer: Self-pay | Admitting: Cardiovascular Disease

## 2018-02-20 MED ORDER — METOPROLOL SUCCINATE ER 25 MG PO TB24: 25.0000 mg | ORAL_TABLET | Freq: Every day | ORAL | 0 refills | Status: DC

## 2018-02-20 NOTE — Telephone Encounter (Signed)
Pt overdue for follow up. Needs medication refilled. She is having some issues with her blood pressure. Made appointment.

## 2018-02-20 NOTE — Telephone Encounter (Signed)
needs refill on her Metroprolol

## 2018-03-15 NOTE — Progress Notes (Deleted)
Cardiology Office Note    Date:  03/15/2018   ID:  Donna RoysLisa Clarke, DOB [birth year], MRN [id number]  PCP:  No primary care provider on file.  Cardiologist: Charlton HawsPeter Nishan, MD   EP: Dr. Ladona Ridgelaylor  No chief complaint on file.   History of Present Illness:    Donna RoysLisa Clarke is a 48 y.o. female with past medical history of SVT, palpitations, and GERD who presents to the office today for overdue follow-up.  She was examined by Dr. Eden EmmsNishan in 11/2016 as a new patient referral for chest pain and palpitations.  Described multiple occurrences of SVT in the past which had resolved with Adenosine.  She was started on Toprol-XL 50 mg daily and referred to EP for consideration of ablation following event monitor results.  This showed normal sinus rhythm with frequent PVCs but no sustained arrhythmias. She was evaluated by Dr. Ladona Ridgelaylor in 12/2016 and reported fatigue since being started on beta-blocker therapy, therefore Toprol-XL was reduced to 25 mg daily. She was without insurance coverage at that time and wished to continue on medical therapy and call back if she wished to proceed with catheter ablation.    Past Medical History:  Diagnosis Date  . Anemia   . Anxiety   . Arthritis   . Back injury   . Chronic back pain   . Depression   . Dysrhythmia    HX SVT  . GERD (gastroesophageal reflux disease)   . Hypertension   . SVT (supraventricular tachycardia) (HCC)     Past Surgical History:  Procedure Laterality Date  . DILITATION & CURRETTAGE/HYSTROSCOPY WITH NOVASURE ABLATION N/A 08/16/2017   Procedure: DILATATION & CURETTAGE/HYSTEROSCOPY WITH MINERVA  ENDOMETRIAL ABLATION;  Surgeon: Lazaro ArmsEure, Luther H, MD;  Location: [facility];  Service: Gynecology;  Laterality: N/A;  . TUBAL LIGATION    . WRIST SURGERY Right     Current Medications: Outpatient Medications Prior to Visit  Medication Sig Dispense Refill  . acetaminophen (TYLENOL) 500 MG tablet Take 500 mg by mouth every 6 (six) hours as needed for mild  pain or moderate pain.    Marland Kitchen. ibuprofen (ADVIL,MOTRIN) 200 MG tablet Take 800 mg by mouth every 6 (six) hours as needed for mild pain or moderate pain.    . metoprolol succinate (TOPROL XL) 25 MG 24 hr tablet Take 1 tablet (25 mg total) by mouth daily. 30 tablet 0  . Multiple Vitamins-Minerals (ALIVE WOMENS GUMMY PO) Take 2 each by mouth daily.     No facility-administered medications prior to visit.      Allergies:   Clindamycin/lincomycin   Social History   Socioeconomic History  . Marital status: Single    Spouse name: Not on file  . Number of children: Not on file  . Years of education: Not on file  . Highest education level: Not on file  Occupational History  . Not on file  Social Needs  . Financial resource strain: Not on file  . Food insecurity:    Worry: Not on file    Inability: Not on file  . Transportation needs:    Medical: Not on file    Non-medical: Not on file  Tobacco Use  . Smoking status: Former Smoker    Packs/day: 1.00    Years: 33.00    Pack years: 33.00    Types: Cigarettes    Last attempt to quit: 04/02/2008    Years since quitting: 9.9  . Smokeless tobacco: Never Used  Substance and Sexual Activity  .  Alcohol use: No  . Drug use: No  . Sexual activity: Not Currently    Birth control/protection: Surgical  Lifestyle  . Physical activity:    Days per week: Not on file    Minutes per session: Not on file  . Stress: Not on file  Relationships  . Social connections:    Talks on phone: Not on file    Gets together: Not on file    Attends religious service: Not on file    Active member of club or organization: Not on file    Attends meetings of clubs or organizations: Not on file    Relationship status: Not on file  Other Topics Concern  . Not on file  Social History Narrative  . Not on file     Family History:  The patient's ***family history includes COPD in her father; Diabetes in her maternal grandmother; Heart disease in her mother;  Hypertension in her maternal aunt and mother; Kidney disease in her sister.   Review of Systems:   Please see the history of present illness.     General:  No chills, fever, night sweats or weight changes.  Cardiovascular:  No chest pain, dyspnea on exertion, edema, orthopnea, palpitations, paroxysmal nocturnal dyspnea. Dermatological: No rash, lesions/masses Respiratory: No cough, dyspnea Urologic: No hematuria, dysuria Abdominal:   No nausea, vomiting, diarrhea, bright red blood per rectum, melena, or hematemesis Neurologic:  No visual changes, wkns, changes in mental status. All other systems reviewed and are otherwise negative except as noted above.   Physical Exam:    VS:  There were no vitals taken for this visit.   General: Well developed, well nourished,female appearing in no acute distress. Head: Normocephalic, atraumatic, sclera non-icteric, no xanthomas, nares are without discharge.  Neck: No carotid bruits. JVD not elevated.  Lungs: Respirations regular and unlabored, without wheezes or rales.  Heart: ***Regular rate and rhythm. No S3 or S4.  No murmur, no rubs, or gallops appreciated. Abdomen: Soft, non-tender, non-distended with normoactive bowel sounds. No hepatomegaly. No rebound/guarding. No obvious abdominal masses. Msk:  Strength and tone appear normal for age. No joint deformities or effusions. Extremities: No clubbing or cyanosis. No edema.  Distal pedal pulses are 2+ bilaterally. Neuro: Alert and oriented X 3. Moves all extremities spontaneously. No focal deficits noted. Psych:  Responds to questions appropriately with a normal affect. Skin: No rashes or lesions noted  Wt Readings from Last 3 Encounters:  01/09/18 284 lb (128.8 kg)  12/27/17 294 lb 8 oz (133.6 kg)  09/18/17 296 lb 8 oz (134.5 kg)        Studies/Labs Reviewed:   EKG:  EKG is*** ordered today.  The ekg ordered today demonstrates ***  Recent Labs: 08/09/2017: ALT 19; Hemoglobin 11.9;  Platelets 374 09/18/2017: BUN 12; Creatinine, Ser 0.71; Potassium 4.2; Sodium 140   Lipid Panel    Component Value Date/Time   CHOL 159 10/14/2016 0947   TRIG 140 10/14/2016 0947   HDL 36 (L) 10/14/2016 0947   CHOLHDL 4.4 10/14/2016 0947   VLDL 28 10/14/2016 0947   LDLCALC 95 10/14/2016 0947    Additional studies/ records that were reviewed today include:   Echocardiogram: 11/2016 Study Conclusions  - Left ventricle: The cavity size was normal. Wall thickness was   increased in a pattern of mild LVH. Systolic function was   vigorous. The estimated ejection fraction was in the range of 65%   to 70%. Wall motion was normal; there were no regional  wall   motion abnormalities. Left ventricular diastolic function   parameters were normal.  Impressions:  - Normal study.  Event Monitor: 12/2016  Normal sinus rhythm with PVCs.  PVCs correlated to her symptoms of "flutters".  NSR with PVC noted when she reported "passed out."  No sustained arrhythmias noted.  Assessment:    No diagnosis found.   Plan:   In order of problems listed above:  1. Palpitations/ History of pSVT - ****    Medication Adjustments/Labs and Tests Ordered: Current medicines are reviewed at length with the patient today.  Concerns regarding medicines are outlined above.  Medication changes, Labs and Tests ordered today are listed in the Patient Instructions below. There are no Patient Instructions on file for this visit.   Signed, Ellsworth LennoxBrittany M Dalayah Deahl, PA-C  03/15/2018 1:05 PM    [facility] [address] [place], [place] [address] Phone: [phone] Fax: [phone]

## 2018-03-16 ENCOUNTER — Ambulatory Visit: Payer: Self-pay | Admitting: Student

## 2018-03-16 DIAGNOSIS — R0989 Other specified symptoms and signs involving the circulatory and respiratory systems: Secondary | ICD-10-CM

## 2018-03-19 ENCOUNTER — Encounter: Payer: Self-pay | Admitting: Student

## 2018-04-05 ENCOUNTER — Telehealth (INDEPENDENT_AMBULATORY_CARE_PROVIDER_SITE_OTHER): Payer: Self-pay

## 2018-04-05 NOTE — Telephone Encounter (Signed)
Received faxed record request along with signed release from pt. They are investigating work comp claim for this pt. All records from 2018 to present from our office faxed to them.

## 2018-06-16 ENCOUNTER — Emergency Department (HOSPITAL_COMMUNITY): Payer: Self-pay

## 2018-06-16 ENCOUNTER — Emergency Department (HOSPITAL_COMMUNITY)
Admission: EM | Admit: 2018-06-16 | Discharge: 2018-06-16 | Disposition: A | Discharge status: Home / Self Care | Payer: Self-pay | Attending: Emergency Medicine | Admitting: Emergency Medicine

## 2018-06-16 ENCOUNTER — Encounter (HOSPITAL_COMMUNITY): Payer: Self-pay

## 2018-06-16 ENCOUNTER — Other Ambulatory Visit: Payer: Self-pay

## 2018-06-16 DIAGNOSIS — R1010 Upper abdominal pain, unspecified: Secondary | ICD-10-CM

## 2018-06-16 DIAGNOSIS — Z87891 Personal history of nicotine dependence: Secondary | ICD-10-CM | POA: Insufficient documentation

## 2018-06-16 DIAGNOSIS — I10 Essential (primary) hypertension: Secondary | ICD-10-CM | POA: Insufficient documentation

## 2018-06-16 DIAGNOSIS — Z79899 Other long term (current) drug therapy: Secondary | ICD-10-CM | POA: Insufficient documentation

## 2018-06-16 DIAGNOSIS — R1032 Left lower quadrant pain: Secondary | ICD-10-CM | POA: Insufficient documentation

## 2018-06-16 LAB — CBC WITH DIFFERENTIAL/PLATELET
Abs Immature Granulocytes: 0.03 10*3/uL (ref 0.00–0.07)
Basophils Absolute: 0.1 10*3/uL (ref 0.0–0.1)
Basophils Relative: 1 %
Eosinophils Absolute: 0.3 10*3/uL (ref 0.0–0.5)
Eosinophils Relative: 3 %
HCT: 41.7 % (ref 36.0–46.0)
Hemoglobin: 12.6 g/dL (ref 12.0–15.0)
Immature Granulocytes: 0 %
Lymphocytes Relative: 29 %
Lymphs Abs: 2.9 10*3/uL (ref 0.7–4.0)
MCH: 24.7 pg — ABNORMAL LOW (ref 26.0–34.0)
MCHC: 30.2 g/dL (ref 30.0–36.0)
MCV: 81.8 fL (ref 80.0–100.0)
Monocytes Absolute: 0.6 10*3/uL (ref 0.1–1.0)
Monocytes Relative: 6 %
Neutro Abs: 6.3 10*3/uL (ref 1.7–7.7)
Neutrophils Relative %: 61 %
Platelets: 320 10*3/uL (ref 150–400)
RBC: 5.1 MIL/uL (ref 3.87–5.11)
RDW: 17.7 % — ABNORMAL HIGH (ref 11.5–15.5)
WBC: 10.2 10*3/uL (ref 4.0–10.5)
nRBC: 0 % (ref 0.0–0.2)

## 2018-06-16 LAB — URINALYSIS, ROUTINE W REFLEX MICROSCOPIC
Bacteria, UA: NONE SEEN
Bilirubin Urine: NEGATIVE
Glucose, UA: NEGATIVE mg/dL
Ketones, ur: NEGATIVE mg/dL
Leukocytes,Ua: NEGATIVE
Nitrite: NEGATIVE
Protein, ur: NEGATIVE mg/dL
Specific Gravity, Urine: 1.005 (ref 1.005–1.030)
pH: 6 (ref 5.0–8.0)

## 2018-06-16 LAB — COMPREHENSIVE METABOLIC PANEL
ALT: 31 U/L (ref 0–44)
AST: 27 U/L (ref 15–41)
Albumin: 3.6 g/dL (ref 3.5–5.0)
Alkaline Phosphatase: 109 U/L (ref 38–126)
Anion gap: 11 (ref 5–15)
BUN: 13 mg/dL (ref 6–20)
CO2: 24 mmol/L (ref 22–32)
Calcium: 8.9 mg/dL (ref 8.9–10.3)
Chloride: 103 mmol/L (ref 98–111)
Creatinine, Ser: 0.5 mg/dL (ref 0.44–1.00)
GFR calc Af Amer: >60 mL/min (ref >60)
GFR calc non Af Amer: >60 mL/min (ref >60)
Glucose, Bld: 112 mg/dL — ABNORMAL HIGH (ref 70–99)
Potassium: 3.8 mmol/L (ref 3.5–5.1)
Sodium: 138 mmol/L (ref 135–145)
Total Bilirubin: 0.4 mg/dL (ref 0.3–1.2)
Total Protein: 7.2 g/dL (ref 6.5–8.1)

## 2018-06-16 LAB — I-STAT BETA HCG BLOOD, ED (MC, WL, AP ONLY): I-stat hCG, quantitative: <5 m[IU]/mL (ref <5)

## 2018-06-16 MED ORDER — ONDANSETRON HCL 4 MG/2ML IJ SOLN
4.0000 mg | Freq: Once | INTRAMUSCULAR | Status: DC
  Filled 2018-06-16: qty 2

## 2018-06-16 MED ORDER — HYDROMORPHONE HCL 1 MG/ML IJ SOLN
1.0000 mg | Freq: Once | INTRAMUSCULAR | Status: DC
  Filled 2018-06-16: qty 1

## 2018-06-16 NOTE — ED Provider Notes (Signed)
[facility] EMERGENCY DEPARTMENT Provider Note   CSN: [id number] Arrival date & time: 06/16/18  0459    History   Chief Complaint Chief Complaint  Patient presents with  . Abdominal Pain    HPI Valerie RoysLisa Clarke is a 48 y.o. female.     Pt complains of left lower abd pain  The history is provided by the patient.  Abdominal Pain  Pain location:  Generalized Pain quality: aching   Pain radiates to:  Does not radiate Pain severity:  Moderate Onset quality:  Sudden Timing:  Constant Progression:  Worsening Chronicity:  New Context: not alcohol use   Associated symptoms: no chest pain, no cough, no diarrhea, no fatigue and no hematuria     Past Medical History:  Diagnosis Date  . Anemia   . Anxiety   . Arthritis   . Back injury   . Chronic back pain   . Depression   . Dysrhythmia    HX SVT  . GERD (gastroesophageal reflux disease)   . Hypertension   . SVT (supraventricular tachycardia) [facility](HCC)     Patient Active Problem List   Diagnosis Date Noted  . 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 Anemia 11/04/2016  . Anxiety 11/04/2016    Past Surgical History:  Procedure Laterality Date  . DILITATION & CURRETTAGE/HYSTROSCOPY WITH NOVASURE ABLATION N/A 08/16/2017   Procedure: DILATATION & CURETTAGE/HYSTEROSCOPY WITH MINERVA  ENDOMETRIAL ABLATION;  Surgeon: Lazaro ArmsEure, Luther H, MD;  Location: [facility];  Service: Gynecology;  Laterality: N/A;  . TUBAL LIGATION    . WRIST SURGERY Right      OB History    Gravida  4   Para  4   Term      Preterm      AB      Living  4     SAB      TAB      Ectopic      Multiple      Live Births               Home Medications    Prior to Admission medications   Medication Sig Start Date End Date Taking? Authorizing Provider  acetaminophen (TYLENOL) 500 MG tablet Take 500 mg by mouth every 6 (six) hours as needed for mild pain or moderate pain.    [provider]  ibuprofen (ADVIL,MOTRIN) 200  MG tablet Take 800 mg by mouth every 6 (six) hours as needed for mild pain or moderate pain.    [provider]  metoprolol succinate (TOPROL XL) 25 MG 24 hr tablet Take 1 tablet (25 mg total) by mouth daily. 02/20/18   Wendall StadeNishan, Peter C, MD  Multiple Vitamins-Minerals (ALIVE WOMENS GUMMY PO) Take 2 each by mouth daily.    [provider]    Family History Family History  Problem Relation Age of Onset  . Hypertension Mother   . Heart disease Mother   . COPD Father   . Hypertension Maternal Aunt   . Diabetes Maternal Grandmother   . Kidney disease Sister     Social History Social History   Tobacco Use  . Smoking status: Former Smoker    Packs/day: 1.00    Years: 33.00    Pack years: 33.00    Types: Cigarettes    Last attempt to quit: 04/02/2008    Years since quitting: 10.2  . Smokeless tobacco: Never Used  Substance Use Topics  . Alcohol use: No  . Drug use: No  Allergies   Clindamycin/lincomycin   Review of Systems Review of Systems  Constitutional: Negative for appetite change and fatigue.  HENT: Negative for congestion, ear discharge and sinus pressure.   Eyes: Negative for discharge.  Respiratory: Negative for cough.   Cardiovascular: Negative for chest pain.  Gastrointestinal: Positive for abdominal pain. Negative for diarrhea.  Genitourinary: Negative for frequency and hematuria.  Musculoskeletal: Negative for back pain.  Skin: Negative for rash.  Neurological: Negative for seizures and headaches.  Psychiatric/Behavioral: Negative for hallucinations.     Physical Exam Updated Vital Signs BP (!) 157/118 (BP Location: Left Arm)   Pulse 77   Temp 98.1 F (36.7 C) (Oral)   Resp 17   Ht 5\' 6"  (1.676 m)   Wt 130.2 kg   SpO2 98%   BMI 46.32 kg/m   Physical Exam Vitals signs and nursing note reviewed.  Constitutional:      Appearance: She is well-developed.  HENT:     Head: Normocephalic.     Nose: Nose normal.  Eyes:      General: No scleral icterus.    Conjunctiva/sclera: Conjunctivae normal.  Neck:     Musculoskeletal: Neck supple.     Thyroid: No thyromegaly.  Cardiovascular:     Rate and Rhythm: Normal rate and regular rhythm.     Heart sounds: No murmur. No friction rub. No gallop.   Pulmonary:     Breath sounds: No stridor. No wheezing or rales.  Chest:     Chest wall: No tenderness.  Abdominal:     General: There is no distension.     Tenderness: There is abdominal tenderness. There is no rebound.  Musculoskeletal: Normal range of motion.  Lymphadenopathy:     Cervical: No cervical adenopathy.  Skin:    Findings: No erythema or rash.  Neurological:     Mental Status: She is oriented to person, place, and time.     Motor: No abnormal muscle tone.     Coordination: Coordination normal.  Psychiatric:        Behavior: Behavior normal.      ED Treatments / Results  Labs (all labs ordered are listed, but only abnormal results are displayed) Labs Reviewed  URINALYSIS, ROUTINE W REFLEX MICROSCOPIC - Abnormal; Notable for the following components:      Result Value   Color, Urine STRAW (*)    Hgb urine dipstick SMALL (*)    All other components within normal limits  CBC WITH DIFFERENTIAL/PLATELET - Abnormal; Notable for the following components:   MCH 24.7 (*)    RDW 17.7 (*)    All other components within normal limits  COMPREHENSIVE METABOLIC PANEL - Abnormal; Notable for the following components:   Glucose, Bld 112 (*)    All other components within normal limits  I-STAT BETA HCG BLOOD, ED ([facility], [facility], [facility] ONLY)    EKG None  Radiology Ct Renal Stone Study  Result Date: 06/16/2018 CLINICAL DATA:  Flank pain, nausea and vomiting. Ovarian cysts. Concern for ureteral stones EXAM: CT ABDOMEN AND PELVIS WITHOUT CONTRAST TECHNIQUE: Multidetector CT imaging of the abdomen and pelvis was performed following the standard protocol without IV contrast. COMPARISON:  CT 03/19/2016 FINDINGS: Lower  chest: Lung bases are clear. Hepatobiliary: No focal hepatic lesion. No biliary duct dilatation. Gallbladder is normal. Common bile duct is normal. Pancreas: Pancreas is normal. No ductal dilatation. No pancreatic inflammation. Spleen: Normal spleen Adrenals/urinary tract: Adrenal glands normal. No nephrolithiasis or ureterolithiasis. No bladder calculi Stomach/Bowel: Stomach, small  bowel, appendix, and cecum are normal. The colon and rectosigmoid colon are normal. Vascular/Lymphatic: Abdominal aorta is normal caliber. No periportal or retroperitoneal adenopathy. No pelvic adenopathy. Reproductive: Low-attenuation region in the central/anterior body of the uterus. This may represent leiomyoma but cannot exclude an endometrial process. No leiomyoma was demonstrated on ultrasound 07/28/2017. Ovaries normal. Other: No free fluid. Musculoskeletal: No aggressive osseous lesion. IMPRESSION: 1. No nephrolithiasis or ureterolithiasis.  No bladder calculi. 2. Normal appendix. 3. Indeterminate low-attenuation region within the central uterus may represent a thickened endometrium. Recommend pelvic ultrasound evaluation. Electronically Signed   By: Genevive Bi M.D.   On: 06/16/2018 06:32    Procedures Procedures (including critical care time)  Medications Ordered in ED Medications  HYDROmorphone (DILAUDID) injection 1 mg (1 mg Intravenous Not Given 06/16/18 0553)  ondansetron (ZOFRAN) injection 4 mg (4 mg Intravenous Not Given 06/16/18 0553)     Initial Impression / Assessment and Plan / ED Course  I have reviewed the triage vital signs and the nursing notes.  Pertinent labs & imaging results that were available during my care of the patient were reviewed by me and considered in my medical decision making (see chart for details).        llq pain,  Most likely gyn.  Pt given vicodin and follow up gyn  Final Clinical Impressions(s) / ED Diagnoses   Final diagnoses:  Pain of upper abdomen    ED  Discharge Orders    None       Bethann Berkshire, MD 06/16/18 [phone]

## 2018-06-16 NOTE — ED Triage Notes (Signed)
Pt reports she has known ovarian cysts on the left, has had intermittent pains to left lower abd for a couple of weeks, tonight is unbearable.  Pain has caused her to vomit.

## 2018-06-16 NOTE — Discharge Instructions (Signed)
Follow up with dr. Eure next week °

## 2018-06-18 ENCOUNTER — Telehealth: Payer: Self-pay | Admitting: Obstetrics & Gynecology

## 2018-06-18 ENCOUNTER — Encounter: Payer: Self-pay | Admitting: *Deleted

## 2018-06-18 NOTE — Telephone Encounter (Signed)
Spoke with patient. She stated that she made an appointment to see Dr. Despina Hidden tomorrow because she is hurting really bad.

## 2018-06-18 NOTE — Telephone Encounter (Signed)
Well she might have a fibroid in her uterus, nothing on her ovary  And it is absolutely not emergent, when available is fine, not urgent or emergent

## 2018-06-18 NOTE — Telephone Encounter (Signed)
Pt states that she went to the ER this weekend and they want her to come in to see Dr. Despina Hidden for a emergency [place] as they detected something on her ovary during her CT scan. Pt requesting to speak with the nurse and then schedule.

## 2018-06-19 ENCOUNTER — Other Ambulatory Visit: Payer: Self-pay

## 2018-06-19 ENCOUNTER — Encounter: Payer: Self-pay | Admitting: Obstetrics & Gynecology

## 2018-06-19 ENCOUNTER — Ambulatory Visit (INDEPENDENT_AMBULATORY_CARE_PROVIDER_SITE_OTHER): Payer: Self-pay | Admitting: Obstetrics & Gynecology

## 2018-06-19 VITALS — BP 118/86 | HR 94 | Ht 66.5 in | Wt 297.0 lb

## 2018-06-19 DIAGNOSIS — R1032 Left lower quadrant pain: Secondary | ICD-10-CM

## 2018-06-19 DIAGNOSIS — N857 Hematometra: Secondary | ICD-10-CM

## 2018-06-19 DIAGNOSIS — K5792 Diverticulitis of intestine, part unspecified, without perforation or abscess without bleeding: Secondary | ICD-10-CM

## 2018-06-19 MED ORDER — METRONIDAZOLE 500 MG PO TABS: 500.0000 mg | ORAL_TABLET | Freq: Two times a day (BID) | ORAL | 0 refills | Status: DC

## 2018-06-19 MED ORDER — CIPROFLOXACIN HCL 500 MG PO TABS: 500.0000 mg | ORAL_TABLET | Freq: Two times a day (BID) | ORAL | 0 refills | Status: DC

## 2018-06-19 MED ORDER — NAPROXEN SODIUM 550 MG PO TABS: 550.0000 mg | ORAL_TABLET | Freq: Two times a day (BID) | ORAL | 1 refills | Status: DC

## 2018-06-19 NOTE — Progress Notes (Signed)
Chief Complaint  Patient presents with  . Follow-up    seen in ED for pain      48 y.o. G4P4 No LMP recorded. Patient has had an ablation. The current method of family planning is tubal ligation.  Outpatient Encounter Medications as of 06/19/2018  Medication Sig  . acetaminophen (TYLENOL) 500 MG tablet Take 500 mg by mouth every 6 (six) hours as needed for mild pain or moderate pain.  Marland Kitchen ibuprofen (ADVIL,MOTRIN) 200 MG tablet Take 800 mg by mouth every 6 (six) hours as needed for mild pain or moderate pain.  . metoprolol succinate (TOPROL XL) 25 MG 24 hr tablet Take 1 tablet (25 mg total) by mouth daily.  . Multiple Vitamins-Minerals (ALIVE WOMENS GUMMY PO) Take 2 each by mouth daily.  . ciprofloxacin (CIPRO) 500 MG tablet Take 1 tablet (500 mg total) by mouth 2 (two) times daily.  . metroNIDAZOLE (FLAGYL) 500 MG tablet Take 1 tablet (500 mg total) by mouth 2 (two) times daily.  . naproxen sodium (ANAPROX DS) 550 MG tablet Take 1 tablet (550 mg total) by mouth 2 (two) times daily with a meal.   No facility-administered encounter medications on file as of 06/19/2018.     Subjective Pt states she started having middle to left lower abdominal pain for about 5 weeks, became "unbearable" 2 weeks ago Had an MRI at [facility] CT in ED yesterday Scans reveal low attenuation central uterine fluid I think is a hemtometra although no one has entertained that as of yet Pain however is quite lateral left unusual for hemtometra post ablation Has had little to no bleeding since ablation and none recently Past Medical History:  Diagnosis Date  . Anemia   . Anxiety   . Arthritis   . Back injury   . Chronic back pain   . Depression   . Dysrhythmia    HX SVT  . GERD (gastroesophageal reflux disease)   . Hypertension   . SVT (supraventricular tachycardia) (HCC)     Past Surgical History:  Procedure Laterality Date  . DILITATION & CURRETTAGE/HYSTROSCOPY WITH NOVASURE ABLATION N/A  08/16/2017   Procedure: DILATATION & CURETTAGE/HYSTEROSCOPY WITH MINERVA  ENDOMETRIAL ABLATION;  Surgeon: Lazaro Arms, MD;  Location: [facility];  Service: Gynecology;  Laterality: N/A;  . TUBAL LIGATION    . WRIST SURGERY Right     OB History    Gravida  4   Para  4   Term      Preterm      AB      Living  4     SAB      TAB      Ectopic      Multiple      Live Births              Allergies  Allergen Reactions  . Clindamycin/Lincomycin Rash    Social History   Socioeconomic History  . Marital status: Single    Spouse name: Not on file  . Number of children: Not on file  . Years of education: Not on file  . Highest education level: Not on file  Occupational History  . Not on file  Social Needs  . Financial resource strain: Not on file  . Food insecurity:    Worry: Not on file    Inability: Not on file  . Transportation needs:    Medical: Not on file    Non-medical: Not on file  Tobacco  Use  . Smoking status: Former Smoker    Packs/day: 1.00    Years: 33.00    Pack years: 33.00    Types: Cigarettes    Last attempt to quit: 04/02/2008    Years since quitting: 10.2  . Smokeless tobacco: Never Used  Substance and Sexual Activity  . Alcohol use: No  . Drug use: No  . Sexual activity: Not Currently    Birth control/protection: Surgical  Lifestyle  . Physical activity:    Days per week: Not on file    Minutes per session: Not on file  . Stress: Not on file  Relationships  . Social connections:    Talks on phone: Not on file    Gets together: Not on file    Attends religious service: Not on file    Active member of club or organization: Not on file    Attends meetings of clubs or organizations: Not on file    Relationship status: Not on file  Other Topics Concern  . Not on file  Social History Narrative  . Not on file    Family History  Problem Relation Age of Onset  . Hypertension Mother   . Heart disease Mother   . COPD Father   .  Hypertension Maternal Aunt   . Diabetes Maternal Grandmother   . Kidney disease Sister     Medications:       Current Outpatient Medications:  .  acetaminophen (TYLENOL) 500 MG tablet, Take 500 mg by mouth every 6 (six) hours as needed for mild pain or moderate pain., Disp: , Rfl:  .  ibuprofen (ADVIL,MOTRIN) 200 MG tablet, Take 800 mg by mouth every 6 (six) hours as needed for mild pain or moderate pain., Disp: , Rfl:  .  metoprolol succinate (TOPROL XL) 25 MG 24 hr tablet, Take 1 tablet (25 mg total) by mouth daily., Disp: 30 tablet, Rfl: 0 .  Multiple Vitamins-Minerals (ALIVE WOMENS GUMMY PO), Take 2 each by mouth daily., Disp: , Rfl:  .  ciprofloxacin (CIPRO) 500 MG tablet, Take 1 tablet (500 mg total) by mouth 2 (two) times daily., Disp: 20 tablet, Rfl: 0 .  metroNIDAZOLE (FLAGYL) 500 MG tablet, Take 1 tablet (500 mg total) by mouth 2 (two) times daily., Disp: 20 tablet, Rfl: 0 .  naproxen sodium (ANAPROX DS) 550 MG tablet, Take 1 tablet (550 mg total) by mouth 2 (two) times daily with a meal., Disp: 40 tablet, Rfl: 1  Objective Blood pressure 118/86, pulse 94, height 5' 6.5" (1.689 m), weight 297 lb (134.7 kg).  General WDWN female NAD Vulva:  normal appearing vulva with no masses, tenderness or lesions Vagina:  normal mucosa, no discharge Cervix:  no cervical motion tenderness and no lesions Uterus:  normal size, contour, position, consistency, mobility, non-tender Adnexa: ovaries:present,    Cervix dilated and about 50 cc or so of chocolate fluid(blood that has been sitting there) was released, pain has continued relates as still bad menstrual cramps   Pertinent ROS No burning with urination, frequency or urgency No nausea, vomiting or diarrhea Nor fever chills or other constitutional symptoms   Labs or studies Reviewed labs and scans in ED    Impression Diagnoses this Encounter::   ICD-10-CM   1. Hematometra, post ablation N85.7 US PELVIS TRANSVANGINAL NON-OB (TV  ONLY)   2.3 cm fluid collection on CT consistent with hemtometra post ablation(7/19), cervix dilated and drained, recheck sonogram 2 weeks  2. LLQ pain R10.32 US PELVIS TRANSVANGINAL  NON-OB (TV ONLY)  3. Diverticulitis K57.92    unsure, but certainly her left lateral pain is not solely due to hematometra, treat with cipro and flagyl    Established relevant diagnosis(es):   Plan/Recommendations: Meds ordered this encounter  Medications  . ciprofloxacin (CIPRO) 500 MG tablet    Sig: Take 1 tablet (500 mg total) by mouth 2 (two) times daily.    Dispense:  20 tablet    Refill:  0  . metroNIDAZOLE (FLAGYL) 500 MG tablet    Sig: Take 1 tablet (500 mg total) by mouth 2 (two) times daily.    Dispense:  20 tablet    Refill:  0  . naproxen sodium (ANAPROX DS) 550 MG tablet    Sig: Take 1 tablet (550 mg total) by mouth 2 (two) times daily with a meal.    Dispense:  40 tablet    Refill:  1    Labs or Scans Ordered: Orders Placed This Encounter  Procedures  . US PELVIS TRANSVANGINAL NON-OB (TV ONLY)    Management:: See above notes  Follow up Return in about 2 weeks (around 07/03/2018) for GYN sono, Follow up, with Dr Despina HiddenEure.      All questions were answered.

## 2018-06-30 ENCOUNTER — Other Ambulatory Visit: Payer: Self-pay

## 2018-06-30 ENCOUNTER — Observation Stay (HOSPITAL_BASED_OUTPATIENT_CLINIC_OR_DEPARTMENT_OTHER): Payer: Self-pay

## 2018-06-30 ENCOUNTER — Emergency Department (HOSPITAL_COMMUNITY): Payer: Self-pay

## 2018-06-30 ENCOUNTER — Observation Stay (HOSPITAL_COMMUNITY)
Admission: EM | Admit: 2018-06-30 | Discharge: 2018-07-01 | Disposition: A | Discharge status: Home / Self Care | Payer: Self-pay | Attending: Internal Medicine | Admitting: Internal Medicine

## 2018-06-30 ENCOUNTER — Encounter (HOSPITAL_COMMUNITY): Payer: Self-pay | Admitting: Emergency Medicine

## 2018-06-30 DIAGNOSIS — F329 Major depressive disorder, single episode, unspecified: Secondary | ICD-10-CM | POA: Insufficient documentation

## 2018-06-30 DIAGNOSIS — I471 Supraventricular tachycardia: Secondary | ICD-10-CM | POA: Insufficient documentation

## 2018-06-30 DIAGNOSIS — K219 Gastro-esophageal reflux disease without esophagitis: Secondary | ICD-10-CM | POA: Insufficient documentation

## 2018-06-30 DIAGNOSIS — Z79899 Other long term (current) drug therapy: Secondary | ICD-10-CM | POA: Insufficient documentation

## 2018-06-30 DIAGNOSIS — F419 Anxiety disorder, unspecified: Secondary | ICD-10-CM | POA: Diagnosis present

## 2018-06-30 DIAGNOSIS — R079 Chest pain, unspecified: Secondary | ICD-10-CM

## 2018-06-30 DIAGNOSIS — Z791 Long term (current) use of non-steroidal anti-inflammatories (NSAID): Secondary | ICD-10-CM | POA: Insufficient documentation

## 2018-06-30 DIAGNOSIS — M5126 Other intervertebral disc displacement, lumbar region: Secondary | ICD-10-CM | POA: Insufficient documentation

## 2018-06-30 DIAGNOSIS — M549 Dorsalgia, unspecified: Secondary | ICD-10-CM | POA: Insufficient documentation

## 2018-06-30 DIAGNOSIS — Z1159 Encounter for screening for other viral diseases: Secondary | ICD-10-CM | POA: Insufficient documentation

## 2018-06-30 DIAGNOSIS — R7303 Prediabetes: Secondary | ICD-10-CM | POA: Insufficient documentation

## 2018-06-30 DIAGNOSIS — R739 Hyperglycemia, unspecified: Secondary | ICD-10-CM | POA: Diagnosis present

## 2018-06-30 DIAGNOSIS — Z6841 Body Mass Index (BMI) 40.0 and over, adult: Secondary | ICD-10-CM | POA: Insufficient documentation

## 2018-06-30 DIAGNOSIS — Z87891 Personal history of nicotine dependence: Secondary | ICD-10-CM | POA: Insufficient documentation

## 2018-06-30 DIAGNOSIS — G8929 Other chronic pain: Secondary | ICD-10-CM | POA: Insufficient documentation

## 2018-06-30 DIAGNOSIS — D649 Anemia, unspecified: Secondary | ICD-10-CM | POA: Insufficient documentation

## 2018-06-30 DIAGNOSIS — M1990 Unspecified osteoarthritis, unspecified site: Secondary | ICD-10-CM | POA: Insufficient documentation

## 2018-06-30 DIAGNOSIS — R9431 Abnormal electrocardiogram [ECG] [EKG]: Secondary | ICD-10-CM | POA: Insufficient documentation

## 2018-06-30 DIAGNOSIS — I10 Essential (primary) hypertension: Secondary | ICD-10-CM | POA: Insufficient documentation

## 2018-06-30 DIAGNOSIS — R002 Palpitations: Principal | ICD-10-CM | POA: Insufficient documentation

## 2018-06-30 LAB — BASIC METABOLIC PANEL
Anion gap: 9 (ref 5–15)
BUN: 12 mg/dL (ref 6–20)
CO2: 25 mmol/L (ref 22–32)
Calcium: 8.8 mg/dL — ABNORMAL LOW (ref 8.9–10.3)
Chloride: 106 mmol/L (ref 98–111)
Creatinine, Ser: 0.77 mg/dL (ref 0.44–1.00)
GFR calc Af Amer: >60 mL/min (ref >60)
GFR calc non Af Amer: >60 mL/min (ref >60)
Glucose, Bld: 118 mg/dL — ABNORMAL HIGH (ref 70–99)
Potassium: 3.6 mmol/L (ref 3.5–5.1)
Sodium: 140 mmol/L (ref 135–145)

## 2018-06-30 LAB — CBC
HCT: 42.3 % (ref 36.0–46.0)
Hemoglobin: 13 g/dL (ref 12.0–15.0)
MCH: 25 pg — ABNORMAL LOW (ref 26.0–34.0)
MCHC: 30.7 g/dL (ref 30.0–36.0)
MCV: 81.3 fL (ref 80.0–100.0)
Platelets: 319 10*3/uL (ref 150–400)
RBC: 5.2 MIL/uL — ABNORMAL HIGH (ref 3.87–5.11)
RDW: 18.9 % — ABNORMAL HIGH (ref 11.5–15.5)
WBC: 12.1 10*3/uL — ABNORMAL HIGH (ref 4.0–10.5)
nRBC: 0 % (ref 0.0–0.2)

## 2018-06-30 LAB — HEMOGLOBIN A1C
Hgb A1c MFr Bld: 5.8 % — ABNORMAL HIGH (ref 4.8–5.6)
Mean Plasma Glucose: 119.76 mg/dL

## 2018-06-30 LAB — ECHOCARDIOGRAM COMPLETE
Height: 66 in
Weight: 4761.94 oz

## 2018-06-30 LAB — LIPID PANEL
Cholesterol: 146 mg/dL (ref 0–200)
HDL: 38 mg/dL — ABNORMAL LOW (ref >40)
LDL Cholesterol: 72 mg/dL (ref 0–99)
Total CHOL/HDL Ratio: 3.8 RATIO
Triglycerides: 181 mg/dL — ABNORMAL HIGH (ref <150)
VLDL: 36 mg/dL (ref 0–40)

## 2018-06-30 LAB — D-DIMER, QUANTITATIVE: D-Dimer, Quant: 0.41 ug/mL-FEU (ref 0.00–0.50)

## 2018-06-30 LAB — TROPONIN I
Troponin I: <0.03 ng/mL (ref <0.03)
Troponin I: <0.03 ng/mL (ref <0.03)

## 2018-06-30 LAB — SARS CORONAVIRUS 2 BY RT PCR (HOSPITAL ORDER, PERFORMED IN ~~LOC~~ HOSPITAL LAB): SARS Coronavirus 2: NEGATIVE

## 2018-06-30 MED ORDER — ACETAMINOPHEN 650 MG RE SUPP: 650.0000 mg | Freq: Four times a day (QID) | RECTAL | Status: DC | PRN

## 2018-06-30 MED ORDER — ALUM & MAG HYDROXIDE-SIMETH 200-200-20 MG/5ML PO SUSP: 15.0000 mL | Freq: Four times a day (QID) | ORAL | Status: DC | PRN

## 2018-06-30 MED ORDER — ACETAMINOPHEN 325 MG PO TABS
650.0000 mg | ORAL_TABLET | Freq: Four times a day (QID) | ORAL | Status: DC | PRN
  Filled 2018-06-30: qty 2

## 2018-06-30 MED ORDER — ATORVASTATIN CALCIUM 80 MG PO TABS
80.0000 mg | ORAL_TABLET | Freq: Every day | ORAL | Status: DC
  Administered 2018-06-30: 80 mg via ORAL
  Filled 2018-06-30: qty 1

## 2018-06-30 MED ORDER — SODIUM CHLORIDE 0.9% FLUSH: 3.0000 mL | INTRAVENOUS | Status: DC | PRN

## 2018-06-30 MED ORDER — REGADENOSON 0.4 MG/5ML IV SOLN
0.4000 mg | Freq: Once | INTRAVENOUS | Status: DC
  Filled 2018-06-30: qty 5

## 2018-06-30 MED ORDER — ASPIRIN EC 325 MG PO TBEC
325.0000 mg | DELAYED_RELEASE_TABLET | Freq: Every day | ORAL | Status: DC
  Administered 2018-06-30 – 2018-07-01 (×2): 325 mg via ORAL
  Filled 2018-06-30 (×2): qty 1

## 2018-06-30 MED ORDER — SODIUM CHLORIDE 0.9 % IV SOLN: 250.0000 mL | INTRAVENOUS | Status: DC | PRN

## 2018-06-30 MED ORDER — CARVEDILOL 3.125 MG PO TABS
3.1250 mg | ORAL_TABLET | Freq: Two times a day (BID) | ORAL | Status: DC
  Administered 2018-06-30 – 2018-07-01 (×2): 3.125 mg via ORAL
  Filled 2018-06-30 (×7): qty 1

## 2018-06-30 MED ORDER — PANTOPRAZOLE SODIUM 40 MG PO TBEC
40.0000 mg | DELAYED_RELEASE_TABLET | Freq: Every day | ORAL | Status: DC
  Administered 2018-06-30 – 2018-07-01 (×2): 40 mg via ORAL
  Filled 2018-06-30 (×2): qty 1

## 2018-06-30 MED ORDER — TECHNETIUM TC 99M TETROFOSMIN IV KIT
30.0000 | PACK | Freq: Once | INTRAVENOUS | Status: AC | PRN
  Administered 2018-06-30: 30 via INTRAVENOUS

## 2018-06-30 MED ORDER — REGADENOSON 0.4 MG/5ML IV SOLN
INTRAVENOUS | Status: AC
  Administered 2018-06-30: 0.4 mg
  Filled 2018-06-30: qty 5

## 2018-06-30 MED ORDER — ENOXAPARIN SODIUM 80 MG/0.8ML ~~LOC~~ SOLN
0.5000 mg/kg | SUBCUTANEOUS | Status: DC
  Administered 2018-06-30 – 2018-07-01 (×2): 70 mg via SUBCUTANEOUS
  Filled 2018-06-30 (×2): qty 0.8

## 2018-06-30 MED ORDER — MORPHINE SULFATE (PF) 2 MG/ML IV SOLN: 0.5000 mg | Freq: Four times a day (QID) | INTRAVENOUS | Status: DC | PRN

## 2018-06-30 MED ORDER — SODIUM CHLORIDE 0.9% FLUSH
3.0000 mL | Freq: Two times a day (BID) | INTRAVENOUS | Status: DC
  Administered 2018-06-30 – 2018-07-01 (×3): 3 mL via INTRAVENOUS

## 2018-06-30 NOTE — ED Triage Notes (Signed)
Pt with hx of SVT. States she had 35-40 mins of palpitations at home but that they subsided but that she now has chest pressure with nausea.

## 2018-06-30 NOTE — ED Notes (Signed)
Trixie , RN to give Coreg arrival to [facility].

## 2018-06-30 NOTE — Progress Notes (Signed)
*  PRELIMINARY RESULTS* Echocardiogram 2D Echocardiogram has been performed.  Jeryl Columbia 06/30/2018, 8:29 AM

## 2018-06-30 NOTE — H&P (Signed)
TRH H&P    Patient Demographics:    Donna Long, is a 48 y.o. female  MRN: [id number]  DOB - 25-Aug-1970  Admit Date - 06/30/2018  Referring MD/NP/PA:  Glynn Octave  Outpatient Primary MD for the patient is [facility]  Nona Dell - cardiologist  Patient coming from:  home  Chief complaint- chest pain   HPI:    Donna Long  is a 48 y.o. female, anxiety, morbidly obese, severe gerd, apparently c/o sharp substernal chest pain, without radiation at 1158 pm last nite.  Lasted for about 40 minutes.  Took 2  aspirin at home with some relief.  Slight palpitations, slight nausea.  Emesis x1 (no blood),  Pt denies fever, chills,cough, sob,  abd pain, diarrhea, brbpr.    In ED,  T 98 P 76 R 123/62 pox 100% Wt 135kg  Wbc 12.1, hgb 13.0, Plt 319 Na 140, K 3.6, Bun 12, Creatinine 0.77  Trop <0.03 D dimer 0.41  EKG nsr at 85, nl axis, nl int, t inversion in 3 avf, v4-6 (new)  CXR IMPRESSION: No active cardiopulmonary disease.  Per ED, spoke with cardiology , recommended transfer to [facility] for evaluation with nuclear stress test.   Pt will be admitted for evaluation of chest pain.         Review of systems:    In addition to the HPI above,  No Fever-chills, No Headache, No changes with Vision or hearing, No problems swallowing food or Liquids, No Cough or Shortness of Breath, No Abdominal pain, No Nausea or Vomiting, bowel movements are regular, No Blood in stool or Urine, No dysuria, No new skin rashes or bruises, No new joints pains-aches,  No new weakness, tingling, numbness in any extremity, No recent weight gain or loss, No polyuria, polydypsia or polyphagia, No significant Mental Stressors.  All other systems reviewed and are negative.    Past History of the following :    Past Medical History:  Diagnosis Date  . Anemia   . Anxiety   . Arthritis    . Back injury   . Chronic back pain   . Depression   . Dysrhythmia    HX SVT  . GERD (gastroesophageal reflux disease)   . Hypertension   . SVT (supraventricular tachycardia) (HCC)       Past Surgical History:  Procedure Laterality Date  . DILITATION & CURRETTAGE/HYSTROSCOPY WITH NOVASURE ABLATION N/A 08/16/2017   Procedure: DILATATION & CURETTAGE/HYSTEROSCOPY WITH MINERVA  ENDOMETRIAL ABLATION;  Surgeon: Lazaro Arms, MD;  Location: [facility];  Service: Gynecology;  Laterality: N/A;  . TUBAL LIGATION    . WRIST SURGERY Right       Social History:      Social History   Tobacco Use  . Smoking status: Former Smoker    Packs/day: 1.00    Years: 33.00    Pack years: 33.00    Types: Cigarettes    Last attempt to quit: 04/02/2008    Years since quitting: 10.2  . Smokeless tobacco: Never  Used  Substance Use Topics  . Alcohol use: No       Family History :     Family History  Problem Relation Age of Onset  . Hypertension Mother   . Heart disease Mother   . COPD Father   . Hypertension Maternal Aunt   . Diabetes Maternal Grandmother   . Kidney disease Sister        Home Medications:   Prior to Admission medications   Medication Sig Start Date End Date Taking? Authorizing Provider  Multiple Vitamins-Minerals (ALIVE WOMENS GUMMY PO) Take 2 each by mouth daily.   Yes [provider]  naproxen sodium (ANAPROX DS) 550 MG tablet Take 1 tablet (550 mg total) by mouth 2 (two) times daily with a meal. 06/19/18  Yes Lazaro Arms, MD  acetaminophen (TYLENOL) 500 MG tablet Take 500 mg by mouth every 6 (six) hours as needed for mild pain or moderate pain.    [provider]  ciprofloxacin (CIPRO) 500 MG tablet Take 1 tablet (500 mg total) by mouth 2 (two) times daily. 06/19/18   Lazaro Arms, MD  ibuprofen (ADVIL,MOTRIN) 200 MG tablet Take 800 mg by mouth every 6 (six) hours as needed for mild pain or moderate pain.    [provider]  metroNIDAZOLE  (FLAGYL) 500 MG tablet Take 1 tablet (500 mg total) by mouth 2 (two) times daily. 06/19/18   Lazaro Arms, MD     Allergies:     Allergies  Allergen Reactions  . Clindamycin/Lincomycin Rash     Physical Exam:   Vitals  Blood pressure 113/75, pulse 68, temperature 98 F (36.7 C), temperature source Oral, resp. rate 18, height 5\' 6"  (1.676 m), weight 135 kg, SpO2 100 %.  1.  General: axox3  2. Psychiatric: euthymic  3. Neurologic: cn2-12 intact, reflexes 2+ symmetric, diffuse with no clonus, motor 5/5 in all 4 ext  4. HEENMT:  Anicteric, pupils, 1.9mm symmetric, direct consensual, near intact  5. Respiratory : CTAB  6. Cardiovascular : rrr s1, s2, no m/g/r  7. Gastrointestinal:  Abd: obese, nt, nd, +bs  8. Skin:  Ext: no c/c/e, no rash  9.Musculoskeletal:  Good ROM,  No adenopathy    Data Review:    CBC Recent Labs  Lab 06/30/18 0135  WBC 12.1*  HGB 13.0  HCT 42.3  PLT 319  MCV 81.3  MCH 25.0*  MCHC 30.7  RDW 18.9*   ------------------------------------------------------------------------------------------------------------------  Results for orders placed or performed during the hospital encounter of 06/30/18 (from the past 48 hour(s))  Basic metabolic panel     Status: Abnormal   Collection Time: 06/30/18  1:35 AM  Result Value Ref Range   Sodium 140 135 - 145 mmol/L   Potassium 3.6 3.5 - 5.1 mmol/L   Chloride 106 98 - 111 mmol/L   CO2 25 22 - 32 mmol/L   Glucose, Bld 118 (H) 70 - 99 mg/dL   BUN 12 6 - 20 mg/dL   Creatinine, Ser 7.47 0.44 - 1.00 mg/dL   Calcium 8.8 (L) 8.9 - 10.3 mg/dL   GFR calc non Af Amer >60 >60 mL/min   GFR calc Af Amer >60 >60 mL/min   Anion gap 9 5 - 15    Comment: Performed at [facility], [address], [place], [place] [address]  CBC     Status: Abnormal   Collection Time: 06/30/18  1:35 AM  Result Value Ref Range   WBC 12.1 (H)  4.0 - 10.5 K/uL   RBC 5.20 (H) 3.87 - 5.11 MIL/uL   Hemoglobin 13.0 12.0 -  15.0 g/dL   HCT 16.1 09.6 - 04.5 %   MCV 81.3 80.0 - 100.0 fL   MCH 25.0 (L) 26.0 - 34.0 pg   MCHC 30.7 30.0 - 36.0 g/dL   RDW 40.9 (H) 81.1 - 91.4 %   Platelets 319 150 - 400 K/uL   nRBC 0.0 0.0 - 0.2 %    Comment: Performed at [facility], [address], [place], [place] [address]  Troponin I - ONCE - STAT     Status: None   Collection Time: 06/30/18  1:35 AM  Result Value Ref Range   Troponin I <0.03 <0.03 ng/mL    Comment: Performed at [facility], [address] [place], [place] [address]  D-dimer, quantitative (not at [facility])     Status: None   Collection Time: 06/30/18  1:35 AM  Result Value Ref Range   D-Dimer, Quant 0.41 0.00 - 0.50 ug/mL-FEU    Comment: (NOTE) At the manufacturer cut-off of 0.50 ug/mL FEU, this assay has been documented to exclude PE with a sensitivity and negative predictive value of 97 to 99%.  At this time, this assay has not been approved by the FDA to exclude DVT/VTE. Results should be correlated with clinical presentation. Performed at [facility], [address], [place], [place] [address]     Chemistries  Recent Labs  Lab 06/30/18 0135  NA 140  K 3.6  CL 106  CO2 25  GLUCOSE 118*  BUN 12  CREATININE 0.77  CALCIUM 8.8*   ------------------------------------------------------------------------------------------------------------------  ------------------------------------------------------------------------------------------------------------------ GFR: Estimated Creatinine Clearance: 121.6 mL/min (by C-G formula based on SCr of 0.77 mg/dL). Liver Function Tests: No results for input(s): AST, ALT, ALKPHOS, BILITOT, PROT, ALBUMIN in the last 168 hours. No results for input(s): LIPASE, AMYLASE in the last 168 hours. No results for input(s): AMMONIA in the last 168 hours. Coagulation Profile: No results for input(s): INR, PROTIME in the last 168 hours. Cardiac Enzymes: Recent Labs  Lab 06/30/18 0135  TROPONINI <0.03   BNP (last 3  results) No results for input(s): PROBNP in the last 8760 hours. HbA1C: No results for input(s): HGBA1C in the last 72 hours. CBG: No results for input(s): GLUCAP in the last 168 hours. Lipid Profile: No results for input(s): CHOL, HDL, LDLCALC, TRIG, CHOLHDL, LDLDIRECT in the last 72 hours. Thyroid Function Tests: No results for input(s): TSH, T4TOTAL, FREET4, T3FREE, THYROIDAB in the last 72 hours. Anemia Panel: No results for input(s): VITAMINB12, FOLATE, FERRITIN, TIBC, IRON, RETICCTPCT in the last 72 hours.  --------------------------------------------------------------------------------------------------------------- Urine analysis:    Component Value Date/Time   COLORURINE STRAW (A) 06/16/2018 0520   APPEARANCEUR CLEAR 06/16/2018 0520   LABSPEC 1.005 06/16/2018 0520   PHURINE 6.0 06/16/2018 0520   GLUCOSEU NEGATIVE 06/16/2018 0520   HGBUR SMALL (A) 06/16/2018 0520   BILIRUBINUR NEGATIVE 06/16/2018 0520   KETONESUR NEGATIVE 06/16/2018 0520   PROTEINUR NEGATIVE 06/16/2018 0520   NITRITE NEGATIVE 06/16/2018 0520   LEUKOCYTESUR NEGATIVE 06/16/2018 0520      Imaging Results:    Dg Chest 2 View  Result Date: 06/30/2018 CLINICAL DATA:  Cardiac palpitations EXAM: CHEST - 2 VIEW COMPARISON:  03/13/2017 FINDINGS: The heart size and mediastinal contours are within normal limits. Both lungs are clear. The visualized skeletal structures are unremarkable. IMPRESSION: No active cardiopulmonary disease. Electronically Signed   By: Alcide Clever M.D.   On: 06/30/2018  04:00   Ct Head Wo Contrast  Result Date: 06/30/2018 CLINICAL DATA:  Severe headaches EXAM: CT HEAD WITHOUT CONTRAST TECHNIQUE: Contiguous axial images were obtained from the base of the skull through the vertex without intravenous contrast. COMPARISON:  None. FINDINGS: Brain: No evidence of acute infarction, hemorrhage, hydrocephalus, extra-axial collection or mass lesion/mass effect. Vascular: No hyperdense vessel or  unexpected calcification. Skull: Normal. Negative for fracture or focal lesion. Sinuses/Orbits: No acute finding. Other: None. IMPRESSION: No acute intracranial abnormality noted. Electronically Signed   By: Alcide CleverMark  Lukens M.D.   On: 06/30/2018 03:59       Assessment & Plan:    Active Problems:   Anxiety   Chest pain   Hyperglycemia  Chest pain w new EKG changes, currently pain free Tele Trop I q6hx3 Check hga1c, lipid Check cardiac echo Nuclear stress test Start Aspirin 325mg  po qday Start Lipitor 80mg  po qhs Start Carvedilol 3.125mg  po bid Morphine 0.5mg  iv q6h prn pain Please consult cardiology in AM  Hyperglycemia Check hga1c If elevated please initiate fsbs, and ssi  DVT Prophylaxis-   Lovenox - SCDs   AM Labs Ordered, also please review Full Orders  Family Communication: Admission, patients condition and plan of care including tests being ordered have been discussed with the patient  who indicate understanding and agree with the plan and Code Status.  Code Status:  FULL CODE  Admission status: Observation: Based on patients clinical presentation and evaluation of above clinical data, I have made determination that patient meets observation criteria at this time.  Time spent in minutes : 55   Pearson GrippeJames Seham Gardenhire M.D on 06/30/2018 at 4:52 AM

## 2018-06-30 NOTE — ED Notes (Signed)
Patient back from CT scan.

## 2018-06-30 NOTE — Progress Notes (Signed)
  PROGRESS NOTE  Donna Long HLK:[id number] DOB: 1970/03/07 DOA: 06/30/2018 PCP: [facility]   LOS: 0 days   Brief narrative: Patient is a a 48 y.o. female with history of hypertension, SVT, anemia, anxiety, arthritis, GERD who presented to [facility] ED on 5/22 with complaint of sharp substernal chest pain that lasted for about 40 minutes. She took two 81mg  aspirin at home with some relief.   Trop <0.03 D dimer 0.41 EKG nsr at 85, nl axis, nl int, t inversion in III, avf, v4-6 (new) Patient was transferred to [facility] for further evaluation with nuclear stress test.  Subjective: Patient was seen and examined this afternoon.  Not in distress.  Chest pain resolved.  Assessment/Plan:  Active Problems:   Anxiety   Chest pain   Hyperglycemia  Acute coronary syndrome -Presented with chest pain, has risk factors for coronary artery disease. -Troponin normal, EKG with T wave inversion in lead III, aVF and V4 to V6. - Planned for stress test. -Cardiology consult called. -Patient has been started on aspirin 325 mg daily, Lipitor 80 mg daily, Coreg 3.125 mg twice daily and morphine as needed for pain.  Hyperglycemia -Prediabetic, hemoglobin A1c 5.8.  DVT Prophylaxis-   Lovenox - SCDs   Mobility: Encourage ambulation Diet: Cardiac diet DVT prophylaxis:  Lovenox Code Status:   Code Status: Full Code  Family Communication:  Expected Discharge:  Home after negative stress test.  Consultants:  Cardiology  Procedures:    Antimicrobials:  Anti-infectives (From admission, onward)   None      Infusions:  . sodium chloride      Scheduled Meds: . aspirin EC  325 mg Oral Daily  . atorvastatin  80 mg Oral q1800  . carvedilol  3.125 mg Oral BID WC  . enoxaparin (LOVENOX) injection  0.5 mg/kg Subcutaneous Q24H  . regadenoson  0.4 mg Intravenous Once  . sodium chloride flush  3 mL Intravenous Q12H    PRN meds: sodium chloride, acetaminophen **OR**  acetaminophen, morphine injection, sodium chloride flush   Objective: Vitals:   06/30/18 1143 06/30/18 1307  BP:  111/67  Pulse: 86 71  Resp:  20  Temp:  98 F (36.7 C)  SpO2:  99%   No intake or output data in the 24 hours ending 06/30/18 1517 Filed Weights   06/30/18 0130  Weight: 135 kg   Weight change:  Body mass index is 48.04 kg/m.   Physical Exam: General exam: Appears calm and comfortable.  Skin: No rashes, lesions or ulcers. HEENT: Atraumatic, normocephalic, supple neck, no obvious bleeding Lungs: Clear to auscultation bilaterally CVS: Regular rate and rhythm, no murmur GI/Abd soft, nontender, nondistended, bowel sound present CNS: Alert, awake, oriented x3 Psychiatry: Mood appropriate Extremities: No pedal edema, no calf tenderness  Data Review: I have personally reviewed the laboratory data and studies available.  Recent Labs  Lab 06/30/18 0135  WBC 12.1*  HGB 13.0  HCT 42.3  MCV 81.3  PLT 319   Recent Labs  Lab 06/30/18 0135  NA 140  K 3.6  CL 106  CO2 25  GLUCOSE 118*  BUN 12  CREATININE 0.77  CALCIUM 8.8*   Lorin Glass, MD  [facility] 06/30/2018

## 2018-06-30 NOTE — ED Notes (Signed)
Patient transported to CT 

## 2018-06-30 NOTE — ED Provider Notes (Signed)
[facility] Provider Note   CSN: [id number] Arrival date & time: 06/30/18  0118    History   Chief Complaint Chief Complaint  Patient presents with  . Chest Pain    HPI Valerie RoysLisa Clarke is a 48 y.o. female.     Patient with history of anxiety, arthritis, depression, SVT presenting from home with episode of presumed SVT and palpitations that woke her from sleep.  States she went to bed feeling normal.  She woke up around midnight with a sensation of heart racing, chest tightness and pressure with nausea and one episode of vomiting.  This is associated with a sudden onset diffuse headache which is also resolved.  She is concerned this represents SVT as she has had this many times in the past but never lasted this long.  She took aspirin at home.  She has been out of her beta-blocker for several months and has not been taking it.  She states she threw up once at home.  The tightness and "cool sensation" in her chest persisted until she got here and the palpitations and shortness of breath and nausea have resolved.  Her headache is also resolved.  She denies any dizziness or lightheadedness.  No chest pain or shortness of breath currently.  No focal weakness, numbness or tingling.  No fevers.   Chest Pain  Associated symptoms: dizziness, headache, nausea, palpitations, shortness of breath and vomiting   Associated symptoms: no cough, no fever and no weakness     Past Medical History:  Diagnosis Date  . Anemia   . Anxiety   . Arthritis   . Back injury   . Chronic back pain   . Depression   . Dysrhythmia    HX SVT  . GERD (gastroesophageal reflux disease)   . Hypertension   . SVT (supraventricular tachycardia) [facility](HCC)     Patient Active Problem List   Diagnosis Date Noted  . 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 Anemia 11/04/2016  . Anxiety 11/04/2016    Past Surgical History:  Procedure Laterality Date  . DILITATION &  CURRETTAGE/HYSTROSCOPY WITH NOVASURE ABLATION N/A 08/16/2017   Procedure: DILATATION & CURETTAGE/HYSTEROSCOPY WITH MINERVA  ENDOMETRIAL ABLATION;  Surgeon: Lazaro ArmsEure, Luther H, MD;  Location: [facility];  Service: Gynecology;  Laterality: N/A;  . TUBAL LIGATION    . WRIST SURGERY Right      OB History    Gravida  4   Para  4   Term      Preterm      AB      Living  4     SAB      TAB      Ectopic      Multiple      Live Births               Home Medications    Prior to Admission medications   Medication Sig Start Date End Date Taking? Authorizing Provider  acetaminophen (TYLENOL) 500 MG tablet Take 500 mg by mouth every 6 (six) hours as needed for mild pain or moderate pain.   Yes [provider]  ibuprofen (ADVIL,MOTRIN) 200 MG tablet Take 800 mg by mouth every 6 (six) hours as needed for mild pain or moderate pain.   Yes [provider]  Multiple Vitamins-Minerals (ALIVE WOMENS GUMMY PO) Take 2 each by mouth daily.   Yes [provider]  naproxen sodium (ANAPROX DS) 550 MG tablet Take 1 tablet (550  mg total) by mouth 2 (two) times daily with a meal. 06/19/18  Yes Eure, Amaryllis Dyke, MD  ciprofloxacin (CIPRO) 500 MG tablet Take 1 tablet (500 mg total) by mouth 2 (two) times daily. 06/19/18   Lazaro Arms, MD  metoprolol succinate (TOPROL XL) 25 MG 24 hr tablet Take 1 tablet (25 mg total) by mouth daily. Patient taking differently: Take 25 mg by mouth daily. Pt does not have any 02/20/18   Wendall Stade, MD  metroNIDAZOLE (FLAGYL) 500 MG tablet Take 1 tablet (500 mg total) by mouth 2 (two) times daily. 06/19/18   Lazaro Arms, MD    Family History Family History  Problem Relation Age of Onset  . Hypertension Mother   . Heart disease Mother   . COPD Father   . Hypertension Maternal Aunt   . Diabetes Maternal Grandmother   . Kidney disease Sister     Social History Social History   Tobacco Use  . Smoking status: Former Smoker     Packs/day: 1.00    Years: 33.00    Pack years: 33.00    Types: Cigarettes    Last attempt to quit: 04/02/2008    Years since quitting: 10.2  . Smokeless tobacco: Never Used  Substance Use Topics  . Alcohol use: No  . Drug use: No     Allergies   Clindamycin/lincomycin   Review of Systems Review of Systems  Constitutional: Negative for activity change and fever.  HENT: Negative for congestion and rhinorrhea.   Respiratory: Positive for chest tightness and shortness of breath. Negative for cough.   Cardiovascular: Positive for chest pain and palpitations. Negative for leg swelling.  Gastrointestinal: Positive for nausea and vomiting.  Genitourinary: Negative for dysuria, hematuria, vaginal bleeding and vaginal discharge.  Musculoskeletal: Negative for arthralgias and myalgias.  Skin: Negative for rash.  Neurological: Positive for dizziness, light-headedness and headaches. Negative for weakness.   all other systems are negative except as noted in the HPI and PMH.     Physical Exam Updated Vital Signs BP 135/77 (BP Location: Left Arm)   Pulse 81   Temp 98 F (36.7 C) (Oral)   Resp 14   Ht 5\' 6"  (1.676 m)   Wt 135 kg   SpO2 97%   BMI 48.04 kg/m   Physical Exam Vitals signs and nursing note reviewed.  Constitutional:      General: She is not in acute distress.    Appearance: She is well-developed.  HENT:     Head: Normocephalic and atraumatic.     Mouth/Throat:     Pharynx: No oropharyngeal exudate.  Eyes:     Conjunctiva/sclera: Conjunctivae normal.     Pupils: Pupils are equal, round, and reactive to light.  Neck:     Musculoskeletal: Normal range of motion and neck supple.     Comments: No meningismus. Cardiovascular:     Rate and Rhythm: Normal rate and regular rhythm.     Heart sounds: Normal heart sounds. No murmur.  Pulmonary:     Effort: Pulmonary effort is normal. No respiratory distress.     Breath sounds: Normal breath sounds.  Abdominal:      Palpations: Abdomen is soft.     Tenderness: There is no abdominal tenderness. There is no guarding or rebound.  Musculoskeletal: Normal range of motion.        General: No tenderness.  Skin:    General: Skin is warm.  Neurological:     Mental Status: She  is alert and oriented to person, place, and time.     Cranial Nerves: No cranial nerve deficit.     Motor: No abnormal muscle tone.     Coordination: Coordination normal.     Comments: No ataxia on finger to nose bilaterally. No pronator drift. 5/5 strength throughout. CN 2-12 intact.Equal grip strength. Sensation intact.   Psychiatric:        Behavior: Behavior normal.      ED Treatments / Results  Labs (all labs ordered are listed, but only abnormal results are displayed) Labs Reviewed  BASIC METABOLIC PANEL - Abnormal; Notable for the following components:      Result Value   Glucose, Bld 118 (*)    Calcium 8.8 (*)    All other components within normal limits  CBC - Abnormal; Notable for the following components:   WBC 12.1 (*)    RBC 5.20 (*)    MCH 25.0 (*)    RDW 18.9 (*)    All other components within normal limits  TROPONIN I  D-DIMER, QUANTITATIVE (NOT AT [facility])    EKG EKG Interpretation  Date/Time:  Saturday Jun 30 2018 01:27:43 EDT Ventricular Rate:  81 PR Interval:    QRS Duration: 98 QT Interval:  367 QTC Calculation: 426 R Axis:   93 Text Interpretation:  Sinus rhythm Consider left atrial enlargement Borderline right axis deviation Nonspecific T abnormalities, inferior leads Baseline wander in lead(s) V6 Nonspecific T wave abnormality Confirmed by Glynn Octave [phone]) on 06/30/2018 2:48:39 AM   Radiology Dg Chest 2 View  Result Date: 06/30/2018 CLINICAL DATA:  Cardiac palpitations EXAM: CHEST - 2 VIEW COMPARISON:  03/13/2017 FINDINGS: The heart size and mediastinal contours are within normal limits. Both lungs are clear. The visualized skeletal structures are unremarkable. IMPRESSION: No active  cardiopulmonary disease. Electronically Signed   By: Alcide Clever M.D.   On: 06/30/2018 04:00   Ct Head Wo Contrast  Result Date: 06/30/2018 CLINICAL DATA:  Severe headaches EXAM: CT HEAD WITHOUT CONTRAST TECHNIQUE: Contiguous axial images were obtained from the base of the skull through the vertex without intravenous contrast. COMPARISON:  None. FINDINGS: Brain: No evidence of acute infarction, hemorrhage, hydrocephalus, extra-axial collection or mass lesion/mass effect. Vascular: No hyperdense vessel or unexpected calcification. Skull: Normal. Negative for fracture or focal lesion. Sinuses/Orbits: No acute finding. Other: None. IMPRESSION: No acute intracranial abnormality noted. Electronically Signed   By: Alcide Clever M.D.   On: 06/30/2018 03:59    Procedures Procedures (including critical care time)  Medications Ordered in ED Medications - No data to display   Initial Impression / Assessment and Plan / ED Course  I have reviewed the triage vital signs and the nursing notes.  Pertinent labs & imaging results that were available during my care of the patient were reviewed by me and considered in my medical decision making (see chart for details).       Episode of palpitations with chest tightness nausea and vomiting.  Symptoms have since resolved.  EKG is sinus rhythm with nonspecific T wave inversions inferior laterally which appear to be new.  This also was associated with acute onset sudden headache which is also resolved.  CT head obtained within 6 hours of sudden onset headache is negative and essentially rules out subarachnoid hemorrhage.  Troponin and a d-dimer negative. Patient states she is feeling better other than feeling tired.  No chest pain, shortness of breath, palpitations or dizziness.  EKG changes discussed with cardiology fellow Dr.  Lowenstern. She agrees this is concerning and patient should be admitted for a cardiac rule out and possible stress test.  Troponin  negative, d-dimer negative, patient remains chest pain-free.  Observation admission discussed with Dr. Selena Batten for cardiac rule out and likely stress test in the morning.  Final Clinical Impressions(s) / ED Diagnoses   Final diagnoses:  Palpitations  EKG abnormalities  Nonspecific chest pain    ED Discharge Orders    None       Seraj Dunnam, Jeannett Senior, MD 06/30/18 [phone]

## 2018-06-30 NOTE — Progress Notes (Signed)
   Donna Long presented for a 2 Day Lexiscan cardiolite today.  No immediate complications.  Stress imaging is pending at this time and as this is a 2 day study, rest imaging will not be completed until 5/24.  Nicolasa Ducking, NP 06/30/2018, 11:45 AM

## 2018-06-30 NOTE — ED Notes (Signed)
Echo in progress.

## 2018-07-01 ENCOUNTER — Observation Stay (HOSPITAL_COMMUNITY): Payer: Self-pay

## 2018-07-01 DIAGNOSIS — R079 Chest pain, unspecified: Secondary | ICD-10-CM

## 2018-07-01 LAB — COMPREHENSIVE METABOLIC PANEL
ALT: 20 U/L (ref 0–44)
AST: 19 U/L (ref 15–41)
Albumin: 3.2 g/dL — ABNORMAL LOW (ref 3.5–5.0)
Alkaline Phosphatase: 94 U/L (ref 38–126)
Anion gap: 8 (ref 5–15)
BUN: 9 mg/dL (ref 6–20)
CO2: 25 mmol/L (ref 22–32)
Calcium: 8.8 mg/dL — ABNORMAL LOW (ref 8.9–10.3)
Chloride: 106 mmol/L (ref 98–111)
Creatinine, Ser: 0.54 mg/dL (ref 0.44–1.00)
GFR calc Af Amer: >60 mL/min (ref >60)
GFR calc non Af Amer: >60 mL/min (ref >60)
Glucose, Bld: 105 mg/dL — ABNORMAL HIGH (ref 70–99)
Potassium: 3.8 mmol/L (ref 3.5–5.1)
Sodium: 139 mmol/L (ref 135–145)
Total Bilirubin: 0.7 mg/dL (ref 0.3–1.2)
Total Protein: 6.4 g/dL — ABNORMAL LOW (ref 6.5–8.1)

## 2018-07-01 LAB — NM MYOCAR MULTI W/SPECT W/WALL MOTION / EF
Estimated workload: 1 METS
Exercise duration (min): 0 min
Exercise duration (sec): 0 s
MPHR: 172 {beats}/min
Peak HR: 112 {beats}/min
Percent HR: 65 %
Rest HR: 71 {beats}/min

## 2018-07-01 LAB — CBC
HCT: 38.9 % (ref 36.0–46.0)
Hemoglobin: 12 g/dL (ref 12.0–15.0)
MCH: 24.8 pg — ABNORMAL LOW (ref 26.0–34.0)
MCHC: 30.8 g/dL (ref 30.0–36.0)
MCV: 80.4 fL (ref 80.0–100.0)
Platelets: 284 10*3/uL (ref 150–400)
RBC: 4.84 MIL/uL (ref 3.87–5.11)
RDW: 17.9 % — ABNORMAL HIGH (ref 11.5–15.5)
WBC: 10.7 10*3/uL — ABNORMAL HIGH (ref 4.0–10.5)
nRBC: 0 % (ref 0.0–0.2)

## 2018-07-01 MED ORDER — TECHNETIUM TC 99M TETROFOSMIN IV KIT
30.0000 | PACK | Freq: Once | INTRAVENOUS | Status: AC | PRN
  Administered 2018-07-01: 30 via INTRAVENOUS

## 2018-07-01 MED ORDER — PANTOPRAZOLE SODIUM 40 MG PO TBEC: 40.0000 mg | DELAYED_RELEASE_TABLET | Freq: Every day | ORAL | 2 refills | Status: DC

## 2018-07-01 NOTE — Discharge Summary (Signed)
Physician Discharge Summary  Donna Long ZOX:[id number] DOB: 22-Dec-1970 DOA: 06/30/2018  PCP: [facility]  Admit date: 06/30/2018 Discharge date: 07/01/2018  Admitted From: Home Discharge disposition: Home   Code Status: Full Code   Recommendations for Outpatient Follow-Up:   1. Follow-up with cardiology as an outpatient  Discharge Diagnosis:   Active Problems:   Anxiety   Chest pain   Hyperglycemia    History of Present Illness / Brief narrative:  Patient is a a48 y.o.female with history of hypertension, SVT, anemia, anxiety, arthritis, GERD who presented to [facility] ED on 5/22 with complaint of sharp substernal chest pain that lasted for about 40 minutes. She took two  aspirin at home with some relief.  Trop <0.03 D dimer 0.41 EKG nsr at 85, nl axis, nl int, t inversion in III, avf, v4-6 (new) Patient was transferred to [facility] for further evaluation with nuclear stress test.  Hospital Course:  Chest pain  - Presented with chest pain, has risk factors for coronary artery disease. -Troponin normal, EKG with T wave inversion in lead III, aVF and V4 to V6. - Observed in the hospital with suspicion of ACS. - Patient needed 2-day Lexiscan Cardiolite 5/23 two 5/24.  It is reported as low risk study.  No need of further intervention at this time. - No history of CAD -not on aspirin. - LDL 72-not on Lipitor - Patient has history of severe GERD which could have been the cause of chest pain.  History of SVT -History of SVT and sees a cardiologist at [facility]. -She had been on metoprolol in the past which she self stopped because of low blood pressure and syncope.  She states that lately she has been started on low-dose metoprolol but she has not pedal from the pharmacy. -Recommend to continue low-dose metoprolol and follow-up with cardiologist as an outpatient.  -Patient states he does not drink alcohol.  She is trying to cut down on coffee  intake.  Chronic trace pedal edema -She has been trying HCTZ 12.5 mg as needed lately.  Since she reports a history of syncope with low blood pressure, I advised her to stop HCTZ while starting metoprolol.  She can monitor her blood pressure at home and add HCTZ only for blood pressure allows.  Patient understands and verbalized the plan.  Hyperglycemia -Prediabetic, hemoglobin A1c 5.8. -Diet controlled advised.  Morbid obesity - Body mass index is 47.89 kg/m. Patient has been advised to make an attempt to improve diet and exercise patterns to aid in weight loss.  Severe GERD -Suggested to try PPI.  Weight loss will help as well.  Stable for discharge home today.  Subjective:  Patient was seen and examined this afternoon.  Pleasant Caucasian female, morbidly obese.  Sitting up at the edge of the bed.  No chest pain at this time.  Discharge Exam:   Vitals:   06/30/18 2023 07/01/18 0535 07/01/18 0911 07/01/18 1151  BP: (!) 108/45 108/66 (!) 111/59 127/64  Pulse: 70 77 67 69  Resp: Temp: 98.2 F (36.8 C) 98.2 F (36.8 C) 98 F (36.7 C) 98.4 F (36.9 C)  TempSrc: Oral Oral Oral Oral  SpO2: 99% 99% 100% 100%  Weight:  134.6 kg    Height:        Body mass index is 47.89 kg/m.  General exam: Appears calm and comfortable.  Skin: No rashes, lesions or ulcers. HEENT: Atraumatic, normocephalic, supple neck,  no obvious bleeding Lungs: Clear to auscultation bilaterally CVS: Regular rate and rhythm, no murmur GI/Abd soft, nontender, nondistended, bowel sound present CNS: Alert, awake oriented x3 Psychiatry: Mood appropriate Extremities: Pedal edema trace bilaterally, chronic  Discharge Instructions:  Wound care: None Discharge Instructions    Diet - low sodium heart healthy   Complete by:  As directed    Increase activity slowly   Complete by:  As directed       Allergies as of 07/01/2018      Reactions   Clindamycin/lincomycin Rash      Medication List     STOP taking these medications   ciprofloxacin 500 MG tablet Commonly known as:  Cipro   metroNIDAZOLE 500 MG tablet Commonly known as:  FLAGYL   naproxen sodium 550 MG tablet Commonly known as:  Anaprox DS     TAKE these medications   ALIVE WOMENS GUMMY PO Take 2 each by mouth daily.   pantoprazole 40 MG tablet Commonly known as:  PROTONIX Take 1 tablet (40 mg total) by mouth daily.   Continue metoprolol as recommended by your cardiologist as an outpatient.    Time coordinating discharge: 35 minutes  The results of significant diagnostics from this hospitalization (including imaging, microbiology, ancillary and laboratory) are listed below for reference.    Procedures and Diagnostic Studies:   Dg Chest 2 View  Result Date: 06/30/2018 CLINICAL DATA:  Cardiac palpitations EXAM: CHEST - 2 VIEW COMPARISON:  03/13/2017 FINDINGS: The heart size and mediastinal contours are within normal limits. Both lungs are clear. The visualized skeletal structures are unremarkable. IMPRESSION: No active cardiopulmonary disease. Electronically Signed   By: Alcide Clever M.D.   On: 06/30/2018 04:00   Ct Head Wo Contrast  Result Date: 06/30/2018 CLINICAL DATA:  Severe headaches EXAM: CT HEAD WITHOUT CONTRAST TECHNIQUE: Contiguous axial images were obtained from the base of the skull through the vertex without intravenous contrast. COMPARISON:  None. FINDINGS: Brain: No evidence of acute infarction, hemorrhage, hydrocephalus, extra-axial collection or mass lesion/mass effect. Vascular: No hyperdense vessel or unexpected calcification. Skull: Normal. Negative for fracture or focal lesion. Sinuses/Orbits: No acute finding. Other: None. IMPRESSION: No acute intracranial abnormality noted. Electronically Signed   By: Alcide Clever M.D.   On: 06/30/2018 03:59   Nm Myocar Multi W/spect W/wall Motion / Ef  Addendum Date: 07/01/2018    There was no ST segment deviation noted during stress.  T wave  inversion was noted during stress in the II, III, aVF, V4, V5 and V6 leads, beginning at 1 minutes of stress, ending at 2 minutes of stress.  The study is normal.  This is a low risk study.  Nuclear stress EF: 59%.  Low risk stress nuclear study with normal perfusion and normal left ventricular regional and global systolic function.   Result Date: 07/01/2018  There was no ST segment deviation noted during stress.  T wave inversion was noted during stress in the II, III, aVF, V4, V5 and V6 leads, beginning at 1 minutes of stress, ending at 2 minutes of stress.  The study is normal.  This is a low risk study.  Low risk stress nuclear study with normal perfusion and normal left ventricular regional and global systolic function.     Labs:   Basic Metabolic Panel: Recent Labs  Lab 06/30/18 0135 07/01/18 0436  NA 140 139  K 3.6 3.8  CL 106 106  CO2 25 25  GLUCOSE 118* 105*  BUN 12 9  CREATININE 0.77 0.54  CALCIUM 8.8* 8.8*   GFR Estimated Creatinine Clearance: 121.4 mL/min (by C-G formula based on SCr of 0.54 mg/dL). Liver Function Tests: Recent Labs  Lab 07/01/18 0436  AST 19  ALT 20  ALKPHOS 94  BILITOT 0.7  PROT 6.4*  ALBUMIN 3.2*   No results for input(s): LIPASE, AMYLASE in the last 168 hours. No results for input(s): AMMONIA in the last 168 hours. Coagulation profile No results for input(s): INR, PROTIME in the last 168 hours.  CBC: Recent Labs  Lab 06/30/18 0135 07/01/18 0436  WBC 12.1* 10.7*  HGB 13.0 12.0  HCT 42.3 38.9  MCV 81.3 80.4  PLT 319 284   Cardiac Enzymes: Recent Labs  Lab 06/30/18 0135 06/30/18 0547  TROPONINI <0.03 <0.03   BNP: Invalid input(s): POCBNP CBG: No results for input(s): GLUCAP in the last 168 hours. D-Dimer Recent Labs    06/30/18 0135  DDIMER 0.41   Hgb A1c Recent Labs    06/30/18 0547  HGBA1C 5.8*   Lipid Profile Recent Labs    06/30/18 0547  CHOL 146  HDL 38*  LDLCALC 72  TRIG 341*  CHOLHDL 3.8    Thyroid function studies No results for input(s): TSH, T4TOTAL, T3FREE, THYROIDAB in the last 72 hours.  Invalid input(s): FREET3 Anemia work up No results for input(s): VITAMINB12, FOLATE, FERRITIN, TIBC, IRON, RETICCTPCT in the last 72 hours. Microbiology Recent Results (from the past 240 hour(s))  SARS Coronavirus 2 (CEPHEID - Performed in [facility] hospital lab), Hosp Order     Status: None   Collection Time: 06/30/18  4:39 AM  Result Value Ref Range Status   SARS Coronavirus 2 NEGATIVE NEGATIVE Final    Comment: (NOTE) If result is NEGATIVE SARS-CoV-2 target nucleic acids are NOT DETECTED. The SARS-CoV-2 RNA is generally detectable in upper and lower  respiratory specimens during the acute phase of infection. The lowest  concentration of SARS-CoV-2 viral copies this assay can detect is 250  copies / mL. A negative result does not preclude SARS-CoV-2 infection  and should not be used as the sole basis for treatment or other  patient management decisions.  A negative result may occur with  improper specimen collection / handling, submission of specimen other  than nasopharyngeal swab, presence of viral mutation(s) within the  areas targeted by this assay, and inadequate number of viral copies  (<250 copies / mL). A negative result must be combined with clinical  observations, patient history, and epidemiological information. If result is POSITIVE SARS-CoV-2 target nucleic acids are DETECTED. The SARS-CoV-2 RNA is generally detectable in upper and lower  respiratory specimens dur ing the acute phase of infection.  Positive  results are indicative of active infection with SARS-CoV-2.  Clinical  correlation with patient history and other diagnostic information is  necessary to determine patient infection status.  Positive results do  not rule out bacterial infection or co-infection with other viruses. If result is PRESUMPTIVE POSTIVE SARS-CoV-2 nucleic acids MAY BE PRESENT.    A presumptive positive result was obtained on the submitted specimen  and confirmed on repeat testing.  While 2019 novel coronavirus  (SARS-CoV-2) nucleic acids may be present in the submitted sample  additional confirmatory testing may be necessary for epidemiological  and / or clinical management purposes  to differentiate between  SARS-CoV-2 and other Sarbecovirus currently known to infect humans.  If clinically indicated additional testing with an alternate test  methodology [phone]) is advised. The SARS-CoV-2 RNA is  generally  detectable in upper and lower respiratory sp ecimens during the acute  phase of infection. The expected result is Negative. Fact Sheet for Patients:  BoilerBrush.com.cyhttps://www.fda.gov/media/136312/download Fact Sheet for Healthcare Providers: https://pope.com/https://www.fda.gov/media/136313/download This test is not yet approved or cleared by the [place] FDA and has been authorized for detection and/or diagnosis of SARS-CoV-2 by FDA under an Emergency Use Authorization (EUA).  This EUA will remain in effect (meaning this test can be used) for the duration of the COVID-19 declaration under Section 564(b)(1) of the Act, 21 U.S.C. section 360bbb-3(b)(1), unless the authorization is terminated or revoked sooner. Performed at [facility], [address] [place], [place] [address]     Signed: Lorin GlassBinaya Teia Freitas  [facility] 07/01/2018, 1:50 PM

## 2018-07-04 ENCOUNTER — Other Ambulatory Visit: Payer: Self-pay | Admitting: Obstetrics & Gynecology

## 2018-07-04 ENCOUNTER — Encounter: Payer: Self-pay | Admitting: *Deleted

## 2018-07-04 DIAGNOSIS — N857 Hematometra: Secondary | ICD-10-CM

## 2018-07-04 DIAGNOSIS — R1032 Left lower quadrant pain: Secondary | ICD-10-CM

## 2018-07-05 ENCOUNTER — Ambulatory Visit (INDEPENDENT_AMBULATORY_CARE_PROVIDER_SITE_OTHER): Payer: Self-pay

## 2018-07-05 ENCOUNTER — Other Ambulatory Visit: Payer: Self-pay

## 2018-07-05 ENCOUNTER — Encounter: Payer: Self-pay | Admitting: Obstetrics & Gynecology

## 2018-07-05 ENCOUNTER — Ambulatory Visit (INDEPENDENT_AMBULATORY_CARE_PROVIDER_SITE_OTHER): Payer: Self-pay | Admitting: Obstetrics & Gynecology

## 2018-07-05 VITALS — BP 158/92 | HR 74 | Ht 66.5 in | Wt 295.0 lb

## 2018-07-05 DIAGNOSIS — R1032 Left lower quadrant pain: Secondary | ICD-10-CM

## 2018-07-05 DIAGNOSIS — N857 Hematometra: Secondary | ICD-10-CM

## 2018-07-05 MED ORDER — NORETHINDRONE 0.35 MG PO TABS: 1.0000 | ORAL_TABLET | Freq: Every day | ORAL | 11 refills | Status: DC

## 2018-07-05 NOTE — Progress Notes (Signed)
Follow up appointment for results  Chief Complaint  Patient presents with  . Follow-up    US    Blood pressure (!) 158/92, pulse 74, height 5' 6.5" (1.689 m), weight 295 lb (133.8 kg).  [place] Pelvis Transvanginal Non-ob (tv Only)  Result Date: 07/05/2018 GYNECOLOGIC SONOGRAM Donna Long is a 48 y.o. G4P4 ,she is here for a pelvic sonogram for post ablation hematometra with LLQ pain. Uterus                      9.8 x 5 x 6.4 cm, Total uterine volume 164 cc,homogeneous anteverted uterus,wnl Endometrium          9.8 mm, symmetrical, wnl Right ovary             2.3 x 1.4 x 2 cm, wnl Left ovary                2 x 1.7 x 2.5 cm, wnl No free fluid Technician Comments: PELVIC US TA/TV:homogeneous anteverted uterus,wnl,mult simple small nabothian cysts,EEC 9.8 mm,normal ovaries bilat,ovaries appear mobile,no free fluid,some discomfort during ultrasound [employer] Carl 07/05/2018 2:12 PM Clinical Impression and recommendations: I have reviewed the sonogram results above, combined with the patient's current clinical course, below are my impressions and any appropriate recommendations for management based on the sonographic findings. Uterus is normal as is endometrium, no hemtometra Both ovaries are normal Donna Long 07/05/2018 2:33 PM   [place] Pelvis Complete  Result Date: 07/05/2018 GYNECOLOGIC SONOGRAM Donna Long is a 48 y.o. G4P4 ,she is here for a pelvic sonogram for post ablation hematometra with LLQ pain. Uterus                      9.8 x 5 x 6.4 cm, Total uterine volume 164 cc,homogeneous anteverted uterus,wnl Endometrium          9.8 mm, symmetrical, wnl Right ovary             2.3 x 1.4 x 2 cm, wnl Left ovary                2 x 1.7 x 2.5 cm, wnl No free fluid Technician Comments: PELVIC US TA/TV:homogeneous anteverted uterus,wnl,mult simple small nabothian cysts,EEC 9.8 mm,normal ovaries bilat,ovaries appear mobile,no free fluid,some discomfort during ultrasound [employer] Carl 07/05/2018 2:12 PM Clinical  Impression and recommendations: I have reviewed the sonogram results above, combined with the patient's current clinical course, below are my impressions and any appropriate recommendations for management based on the sonographic findings. Uterus is normal as is endometrium, no hemtometra Both ovaries are normal Donna Long 07/05/2018 2:33 PM      MEDS ordered this encounter: Meds ordered this encounter  Medications  . norethindrone (MICRONOR) 0.35 MG tablet    Sig: Take 1 tablet (0.35 mg total) by mouth daily. Take 1 a day    Dispense:  1 Package    Refill:  11    Orders for this encounter: No orders of the defined types were placed in this encounter.   Impression: Hematometra, post ablation - Normal sonogram today, post cervical dilation with evacuation of hematometa    Plan: >due to small amount of endometrial stimulation from ovaries and peripheral fat will use micronor which may also help vasomotor symptoms and her emotional lability  Follow Up: Return in about 3 months (around 10/05/2018) for Follow up, with Dr Despina HiddenEure.       Face to face time:  15 minutes  Greater than 50% of the visit time was spent in counseling and coordination of care with the patient.  The summary and outline of the counseling and care coordination is summarized in the note above.   All questions were answered.  Past Medical History:  Diagnosis Date  . Anemia   . Anxiety   . Arthritis   . Back injury   . Chronic back pain   . Depression   . Dysrhythmia    HX SVT  . GERD (gastroesophageal reflux disease)   . Hypertension   . SVT (supraventricular tachycardia) (HCC)     Past Surgical History:  Procedure Laterality Date  . DILITATION & CURRETTAGE/HYSTROSCOPY WITH NOVASURE ABLATION N/A 08/16/2017   Procedure: DILATATION & CURETTAGE/HYSTEROSCOPY WITH MINERVA  ENDOMETRIAL ABLATION;  Surgeon: Donna Arms, MD;  Location: [facility];  Service: Gynecology;  Laterality: N/A;  . TUBAL LIGATION     . WRIST SURGERY Right     OB History    Gravida  4   Para  4   Term      Preterm      AB      Living  4     SAB      TAB      Ectopic      Multiple      Live Births              Allergies  Allergen Reactions  . Clindamycin/Lincomycin Rash    Social History   Socioeconomic History  . Marital status: Single    Spouse name: Not on file  . Number of children: Not on file  . Years of education: Not on file  . Highest education level: Not on file  Occupational History  . Not on file  Social Needs  . Financial resource strain: Not on file  . Food insecurity:    Worry: Not on file    Inability: Not on file  . Transportation needs:    Medical: Not on file    Non-medical: Not on file  Tobacco Use  . Smoking status: Former Smoker    Packs/day: 1.00    Years: 33.00    Pack years: 33.00    Types: Cigarettes    Last attempt to quit: 04/02/2008    Years since quitting: 10.2  . Smokeless tobacco: Never Used  Substance and Sexual Activity  . Alcohol use: No  . Drug use: No  . Sexual activity: Not Currently    Birth control/protection: Surgical  Lifestyle  . Physical activity:    Days per week: Not on file    Minutes per session: Not on file  . Stress: Not on file  Relationships  . Social connections:    Talks on phone: Not on file    Gets together: Not on file    Attends religious service: Not on file    Active member of club or organization: Not on file    Attends meetings of clubs or organizations: Not on file    Relationship status: Not on file  Other Topics Concern  . Not on file  Social History Narrative  . Not on file    Family History  Problem Relation Age of Onset  . Hypertension Mother   . Heart disease Mother   . COPD Father   . Hypertension Maternal Aunt   . Diabetes Maternal Grandmother   . Kidney disease Sister

## 2018-07-05 NOTE — Progress Notes (Signed)
PELVIC US TA/TV:homogeneous anteverted uterus,wnl,mult simple small nabothian cysts,EEC 9.8 mm,normal ovaries bilat,ovaries appear mobile,no free fluid,some discomfort during ultrasound

## 2018-07-17 ENCOUNTER — Telehealth: Payer: Self-pay | Admitting: Obstetrics & Gynecology

## 2018-07-17 MED ORDER — MEGESTROL ACETATE 40 MG PO TABS: ORAL_TABLET | ORAL | 3 refills | Status: DC

## 2018-08-14 ENCOUNTER — Ambulatory Visit (INDEPENDENT_AMBULATORY_CARE_PROVIDER_SITE_OTHER): Payer: Self-pay | Admitting: Obstetrics & Gynecology

## 2018-08-14 ENCOUNTER — Encounter: Payer: Self-pay | Admitting: Obstetrics & Gynecology

## 2018-08-14 ENCOUNTER — Other Ambulatory Visit: Payer: Self-pay

## 2018-08-14 VITALS — BP 150/91 | HR 68 | Ht 66.5 in | Wt 297.0 lb

## 2018-08-14 DIAGNOSIS — N938 Other specified abnormal uterine and vaginal bleeding: Secondary | ICD-10-CM

## 2018-08-14 DIAGNOSIS — N946 Dysmenorrhea, unspecified: Secondary | ICD-10-CM

## 2018-08-14 NOTE — Progress Notes (Signed)
Chief Complaint  Patient presents with  . Follow-up      48 y.o. G4P4 No LMP recorded. Patient has had an ablation. The current method of family planning is tubal ligation.  Outpatient Encounter Medications as of 08/14/2018  Medication Sig  . megestrol (MEGACE) 40 MG tablet 3 tablets a day for 5 days, 2 tablets a day for 5 days then 1 tablet daily  . Multiple Vitamins-Minerals (ALIVE WOMENS GUMMY PO) Take 2 each by mouth daily.  Marland Kitchen. OMEPRAZOLE PO Take by mouth 2 (two) times a day.  . [DISCONTINUED] norethindrone (MICRONOR) 0.35 MG tablet Take 1 tablet (0.35 mg total) by mouth daily. Take 1 a day  . [DISCONTINUED] pantoprazole (PROTONIX) 40 MG tablet Take 1 tablet (40 mg total) by mouth daily.   No facility-administered encounter medications on file as of 08/14/2018.     Subjective Pt was seen and diagnosed with post ablation hematometra which has been managed with evacuation and oral progesterone Still has some spotting on megestrol didi not get take the micronor because of the labelled side effects, I instruted pt it suffers from "guilt by association" class labelling and that it is lower dose than megestrol but a progesterone only Past Medical History:  Diagnosis Date  . Anemia   . Anxiety   . Arthritis   . Back injury   . Chronic back pain   . Depression   . Dysrhythmia    HX SVT  . GERD (gastroesophageal reflux disease)   . Hypertension   . SVT (supraventricular tachycardia) (HCC)     Past Surgical History:  Procedure Laterality Date  . DILITATION & CURRETTAGE/HYSTROSCOPY WITH NOVASURE ABLATION N/A 08/16/2017   Procedure: DILATATION & CURETTAGE/HYSTEROSCOPY WITH MINERVA  ENDOMETRIAL ABLATION;  Surgeon: Lazaro ArmsEure, Tekeya Geffert H, MD;  Location: [facility];  Service: Gynecology;  Laterality: N/A;  . TUBAL LIGATION    . WRIST SURGERY Right     OB History    Gravida  4   Para  4   Term      Preterm      AB      Living  4     SAB      TAB      Ectopic      Multiple      Live Births              Allergies  Allergen Reactions  . Clindamycin/Lincomycin Rash    Social History   Socioeconomic History  . Marital status: Single    Spouse name: Not on file  . Number of children: Not on file  . Years of education: Not on file  . Highest education level: Not on file  Occupational History  . Not on file  Social Needs  . Financial resource strain: Not on file  . Food insecurity    Worry: Not on file    Inability: Not on file  . Transportation needs    Medical: Not on file    Non-medical: Not on file  Tobacco Use  . Smoking status: Former Smoker    Packs/day: 1.00    Years: 33.00    Pack years: 33.00    Types: Cigarettes    Quit date: 04/02/2008    Years since quitting: 10.3  . Smokeless tobacco: Never Used  Substance and Sexual Activity  . Alcohol use: No  . Drug use: No  . Sexual activity: Not Currently    Birth control/protection: Surgical  Lifestyle  .  Physical activity    Days per week: Not on file    Minutes per session: Not on file  . Stress: Not on file  Relationships  . Social Herbalist on phone: Not on file    Gets together: Not on file    Attends religious service: Not on file    Active member of club or organization: Not on file    Attends meetings of clubs or organizations: Not on file    Relationship status: Not on file  Other Topics Concern  . Not on file  Social History Narrative  . Not on file    Family History  Problem Relation Age of Onset  . Hypertension Mother   . Heart disease Mother   . COPD Father   . Hypertension Maternal Aunt   . Diabetes Maternal Grandmother   . Kidney disease Sister     Medications:       Current Outpatient Medications:  .  megestrol (MEGACE) 40 MG tablet, 3 tablets a day for 5 days, 2 tablets a day for 5 days then 1 tablet daily, Disp: 45 tablet, Rfl: 3 .  Multiple Vitamins-Minerals (ALIVE WOMENS GUMMY PO), Take 2 each by mouth daily., Disp: , Rfl:   .  OMEPRAZOLE PO, Take by mouth 2 (two) times a day., Disp: , Rfl:   Objective Blood pressure (!) 150/91, pulse 68, height 5' 6.5" (1.689 m), weight 297 lb (134.7 kg).  Gen WDWN NAD  Pertinent ROS No burning with urination, frequency or urgency No nausea, vomiting or diarrhea Nor fever chills or other constitutional symptoms   Labs or studies     Impression Diagnoses this Encounter::   ICD-10-CM   1. DUB (dysfunctional uterine bleeding)  N93.8   2. Dysmenorrhea  N94.6     Established relevant diagnosis(es):   Plan/Recommendations: No orders of the defined types were placed in this encounter.   Labs or Scans Ordered: No orders of the defined types were placed in this encounter.   Management:: >did not take her micronor because of confusion over class labelling >stop megestrol and have 1 week off  >recommend Eat for Life by Dr Fredirick Lathe for health/weight management Follow up Return if symptoms worsen or fail to improve.      Face to face time:  15 minutes  Greater than 50% of the visit time was spent in counseling and coordination of care with the patient.  The summary and outline of the counseling and care coordination is summarized in the note above.   All questions were answered.   All questions were answered.

## 2018-08-27 ENCOUNTER — Telehealth: Payer: Self-pay | Admitting: Obstetrics & Gynecology

## 2018-08-27 NOTE — Telephone Encounter (Signed)
Please call pt she is still bleeding really bad . He put her on the mini pill last week not sure if she has given enough time yet/ Please advise

## 2018-08-27 NOTE — Telephone Encounter (Addendum)
Pt states Dr. Elonda Husky started her on mini pill and pt has been pouring blood ever since. Started med 1 week ago today. Somedays bleeding is worse than other days. Does pt need to give pill more time to work? Please advise. Thanks!! [place]

## 2018-08-28 NOTE — Telephone Encounter (Signed)
Pt aware to take 1 whole pack and see how she's doing. Pt voiced understanding. [place]

## 2018-09-19 ENCOUNTER — Telehealth: Payer: Self-pay | Admitting: Obstetrics & Gynecology

## 2018-09-19 MED ORDER — MEGESTROL ACETATE 40 MG PO TABS: ORAL_TABLET | ORAL | 3 refills | Status: DC

## 2018-09-19 NOTE — Telephone Encounter (Signed)
Stop micronor and go back on the megestrol

## 2018-09-19 NOTE — Telephone Encounter (Signed)
Pt states she is on the low dose mini pill and has been on it for 4.5 weeks and is still bleeding heavy, to light everyday for the past 4.5 weeks. She is wanting to see what can be done.

## 2018-09-19 NOTE — Telephone Encounter (Signed)
Pt aware of Dr. Brynda Greathouse recommendations. Pt states the Megace kills her stomach but she will take it. Advised to keep appt for 8/28. Pt voiced understanding. [place]

## 2018-10-05 ENCOUNTER — Other Ambulatory Visit: Payer: Self-pay

## 2018-10-05 ENCOUNTER — Ambulatory Visit (INDEPENDENT_AMBULATORY_CARE_PROVIDER_SITE_OTHER): Payer: Self-pay | Admitting: Obstetrics & Gynecology

## 2018-10-05 ENCOUNTER — Telehealth: Payer: Self-pay | Admitting: Obstetrics & Gynecology

## 2018-10-05 ENCOUNTER — Encounter: Payer: Self-pay | Admitting: Obstetrics & Gynecology

## 2018-10-05 VITALS — BP 154/92 | HR 86 | Ht 66.5 in | Wt 296.0 lb

## 2018-10-05 DIAGNOSIS — N946 Dysmenorrhea, unspecified: Secondary | ICD-10-CM

## 2018-10-05 DIAGNOSIS — Z9889 Other specified postprocedural states: Secondary | ICD-10-CM

## 2018-10-05 DIAGNOSIS — N938 Other specified abnormal uterine and vaginal bleeding: Secondary | ICD-10-CM

## 2018-10-05 NOTE — Telephone Encounter (Signed)
Pt is wanting to see if she should continue her megace until her surgery or to stop the medication.

## 2018-10-05 NOTE — Progress Notes (Signed)
Chief Complaint  Patient presents with  . Follow-up      48 y.o. G4P4 No LMP recorded. Patient has had an ablation. The current method of family planning is tubal ligation.  Outpatient Encounter Medications as of 10/05/2018  Medication Sig  . megestrol (MEGACE) 40 MG tablet 3 tablets a day for 5 days, 2 tablets a day for 5 days then 1 tablet daily  . Multiple Vitamins-Minerals (ALIVE WOMENS GUMMY PO) Take 2 each by mouth daily.  Marland Kitchen OMEPRAZOLE PO Take by mouth 2 (two) times a day.  . megestrol (MEGACE) 40 MG tablet 3 tablets a day for 5 days, 2 tablets a day for 5 days then 1 tablet daily (Patient not taking: Reported on 10/05/2018)   No facility-administered encounter medications on file as of 10/05/2018.     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 Anemia   . Anxiety   . Arthritis   . Back injury   . Chronic back pain   . Depression   . Dysrhythmia    HX SVT  . GERD (gastroesophageal reflux disease)   . Hypertension   . SVT (supraventricular tachycardia) (HCC)     Past Surgical History:  Procedure Laterality Date  . DILITATION & CURRETTAGE/HYSTROSCOPY WITH NOVASURE ABLATION N/A 08/16/2017   Procedure: DILATATION & CURETTAGE/HYSTEROSCOPY WITH MINERVA  ENDOMETRIAL ABLATION;  Surgeon: Florian Buff, MD;  Location: [facility];  Service: Gynecology;  Laterality: N/A;  . TUBAL LIGATION    . WRIST SURGERY Right     OB History    Gravida  4   Para  4   Term      Preterm      AB      Living  4     SAB      TAB      Ectopic      Multiple      Live Births              Allergies  Allergen Reactions  . Clindamycin/Lincomycin Rash    Social History   Socioeconomic History  . Marital status: Single    Spouse name:  Not on file  . Number of children: Not on file  . Years of education: Not on file  . Highest education level: Not on file  Occupational History  . Not on file  Social Needs  . Financial resource strain: Not on file  . Food insecurity    Worry: Not on file    Inability: Not on file  . Transportation needs    Medical: Not on file    Non-medical: Not on file  Tobacco Use  . Smoking status: Former Smoker    Packs/day: 1.00    Years: 33.00    Pack years: 33.00    Types: Cigarettes    Quit date: 04/02/2008    Years since quitting: 10.5  . Smokeless tobacco: Never Used  Substance and Sexual Activity  . Alcohol use: No  . Drug use: No  . Sexual activity: Not Currently    Birth control/protection: Surgical  Lifestyle  . Physical activity    Days per week: Not on file    Minutes per session: Not on file  . Stress: Not  on file  Relationships  . Social Musicianconnections    Talks on phone: Not on file    Gets together: Not on file    Attends religious service: Not on file    Active member of club or organization: Not on file    Attends meetings of clubs or organizations: Not on file    Relationship status: Not on file  Other Topics Concern  . Not on file  Social History Narrative  . Not on file    Family History  Problem Relation Age of Onset  . Hypertension Mother   . Heart disease Mother   . COPD Father   . Hypertension Maternal Aunt   . Diabetes Maternal Grandmother   . Kidney disease Sister     Medications:       Current Outpatient Medications:  .  megestrol (MEGACE) 40 MG tablet, 3 tablets a day for 5 days, 2 tablets a day for 5 days then 1 tablet daily, Disp: 45 tablet, Rfl: 3 .  Multiple Vitamins-Minerals (ALIVE WOMENS GUMMY PO), Take 2 each by mouth daily., Disp: , Rfl:  .  OMEPRAZOLE PO, Take by mouth 2 (two) times a day., Disp: , Rfl:  .  megestrol (MEGACE) 40 MG tablet, 3 tablets a day for 5 days, 2 tablets a day for 5 days then 1 tablet daily (Patient not taking:  Reported on 10/05/2018), Disp: 45 tablet, Rfl: 3  Objective Blood pressure (!) 154/92, pulse 86, height 5' 6.5" (1.689 m), weight 296 lb (134.3 kg).  General WDWN female NAD Vulva:  normal appearing vulva with no masses, tenderness or lesions Vagina:  normal mucosa, no discharge  Cervix:  Normal no lesions Uterus:  normal size, contour, position, consistency, mobility, non-tender Appropriate for vaginal removal, somewhat narrow pubic arches Adnexa: ovaries:present,  normal adnexa in size, nontender and no masses   Pertinent ROS No burning with urination, frequency or urgency No nausea, vomiting or diarrhea Nor fever chills or other constitutional symptoms   Labs or studies No new    Impression Diagnoses this Encounter::   ICD-10-CM   1. DUB (dysfunctional uterine bleeding)  N93.8   2. Dysmenorrhea  N94.6   3. S/P endometrial ablation  Z98.890     Established relevant diagnosis(es):   Plan/Recommendations: No orders of the defined types were placed in this encounter.   Labs or Scans Ordered: No orders of the defined types were placed in this encounter.   Management:: >proceed with [facility] BSO 10/17/2018 if possible  Follow up Return in about 24 days (around 10/29/2018) for Post Op, with Dr Despina HiddenEure.        Face to face time:  15 minutes  Greater than 50% of the visit time was spent in counseling and coordination of care with the patient.  The summary and outline of the counseling and care coordination is summarized in the note above.   All questions were answered.

## 2018-10-06 NOTE — Telephone Encounter (Signed)
Continue

## 2018-10-08 NOTE — Telephone Encounter (Signed)
As I stated in the office, uterus tube and ovaries

## 2018-10-08 NOTE — Telephone Encounter (Signed)
Called patient back and informed her of recommendation to continue megace. Pt would like to know what Dr. Elonda Husky is planning to remove during her surgery.

## 2018-10-09 NOTE — Patient Instructions (Signed)
Donna Long  10/09/2018     @PREFPERIOPPHARMACY @   Your procedure is scheduled on  10/17/2018 .  Report to [facility] at  0930  A.M.  Call this number if you have problems the morning of surgery:  [phone]   Remember:  Do not eat or drink after midnight.                       Take these medicines the morning of surgery with A SIP OF WATER  None    Do not wear jewelry, make-up or nail polish.  Do not wear lotions, powders, or perfumes. Please wear deodorant and brush your teeth..  Do not shave 48 hours prior to surgery.  Men may shave face and neck.  Do not bring valuables to the hospital.  [facility] is not responsible for any belongings or valuables.  Contacts, dentures or bridgework may not be worn into surgery.  Leave your suitcase in the car.  After surgery it may be brought to your room.  For patients admitted to the hospital, discharge time will be determined by your treatment team.  Patients discharged the day of surgery will not be allowed to drive home.   Name and phone number of your driver:   family Special instructions:  None  Please read over the following fact sheets that you were given. Anesthesia Post-op Instructions and Care and Recovery After Surgery       Bilateral Salpingo-Oophorectomy, Care After This sheet gives you information about how to care for yourself after your procedure. Your health care provider may also give you more specific instructions. If you have problems or questions, contact your health care provider. What can I expect after the procedure? After the procedure, it is common to have:  Abdominal pain.  Some occasional vaginal bleeding (spotting).  Tiredness.  Symptoms of menopause, such as hot flashes, night sweats, or mood swings. Follow these instructions at home: Incision care   Keep your incision area and your bandage (dressing) clean and dry.  Follow instructions from your health care provider about how  to take care of your incision. Make sure you: ? Wash your hands with soap and water before you change your dressing. If soap and water are not available, use hand sanitizer. ? Change your dressing as told by your health care provider. ? Leave stitches (sutures), staples, skin glue, or adhesive strips in place. These skin closures may need to stay in place for 2 weeks or longer. If adhesive strip edges start to loosen and curl up, you may trim the loose edges. Do not remove adhesive strips completely unless your health care provider tells you to do that.  Check your incision area every day for signs of infection. Check for: ? Redness, swelling, or pain. ? Fluid or blood. ? Warmth. ? Pus or a bad smell. Activity   Do not drive or use heavy machinery while taking prescription pain medicine.  Do not drive for 24 hours if you received a medicine to help you relax (sedative) during your procedure.  Take frequent, short walks throughout the day. Rest when you get tired. Ask your health care provider what activities are safe for you.  Avoid activity that requires great effort. Also, avoid heavy lifting. Do not lift anything that is heavier than 10 lbs. (4.5 kg), or the limit that your health care provider tells you, until he or she says that it  is safe to do so.  Do not douche, use tampons, or have sex until your health care provider approves. General instructions   To prevent or treat constipation while you are taking prescription pain medicine, your health care provider may recommend that you: ? Drink enough fluid to keep your urine clear or pale yellow. ? Take over-the-counter or prescription medicines. ? Eat foods that are high in fiber, such as fresh fruits and vegetables, whole grains, and beans. ? Limit foods that are high in fat and processed sugars, such as fried and sweet foods.  Take over-the-counter and prescription medicines only as told by your health care provider.  Do not  take baths, swim, or use a hot tub until your health care provider approves. Ask your health care provider if you can take showers. You may only be allowed to take sponge baths for bathing.  Wear compression stockings as told by your health care provider. These stockings help to prevent blood clots and reduce swelling in your legs.  Keep all follow-up visits as told by your health care provider. This is important. Contact a health care provider if:  You have pain when you urinate.  You have pus or a bad smelling discharge coming from your vagina.  You have redness, swelling, or pain around your incision.  You have fluid or blood coming from your incision.  Your incision feels warm to the touch.  You have pus or a bad smell coming from your incision.  You have a fever.  Your incision starts to break open.  You have pain in the abdomen, and it gets worse or does not get better when you take medicine.  You develop a rash.  You develop nausea and vomiting.  You feel lightheaded. Get help right away if:  You develop pain in your chest or leg.  You become short of breath.  You faint.  You have increased bleeding from your vagina. Summary  After the procedure, it is common to have pain, bleeding in the vagina, and symptoms of menopause.  Follow instructions from your health care provider about how to take care of your incision.  Follow instructions from your health care provider about activities and restrictions.  Check your incision every day for signs of infection and report any symptoms to your health care provider. This information is not intended to replace advice given to you by your health care provider. Make sure you discuss any questions you have with your health care provider. Document Released: 01/24/2005 Document Revised: 03/30/2018 Document Reviewed: 02/29/2016 Elsevier Patient Education  2020 [employer]  Vaginal Hysterectomy, Care After Refer to this  sheet in the next few weeks. These instructions provide you with information about caring for yourself after your procedure. Your health care provider may also give you more specific instructions. Your treatment has been planned according to current medical practices, but problems sometimes occur. Call your health care provider if you have any problems or questions after your procedure. What can I expect after the procedure? After the procedure, it is common to have:  Pain.  Soreness and numbness in your incision areas.  Vaginal bleeding and discharge.  Constipation.  Temporary problems emptying the bladder.  Feelings of sadness or other emotions. Follow these instructions at home: Medicines  Take over-the-counter and prescription medicines only as told by your health care provider.  If you were prescribed an antibiotic medicine, take it as told by your health care provider. Do not stop taking the antibiotic  even if you start to feel better.  Do not drive or operate heavy machinery while taking prescription pain medicine. Activity  Return to your normal activities as told by your health care provider. Ask your health care provider what activities are safe for you.  Get regular exercise as told by your health care provider. You may be told to take short walks every day and go farther each time.  Do not lift anything that is heavier than 10 lb (4.5 kg). General instructions   Do not put anything in your vagina for 6 weeks after your surgery or as told by your health care provider. This includes tampons and douches.  Do not have sex until your health care provider says you can.  Do not take baths, swim, or use a hot tub until your health care provider approves.  Drink enough fluid to keep your urine clear or pale yellow.  Do not drive for 24 hours if you were given a sedative.  Keep all follow-up visits as told by your health care provider. This is important. Contact a health  care provider if:  Your pain medicine is not helping.  You have a fever.  You have redness, swelling, or pain at your incision site.  You have blood, pus, or a bad-smelling discharge from your vagina.  You continue to have difficulty urinating. Get help right away if:  You have severe abdominal or back pain.  You have heavy bleeding from your vagina.  You have chest pain or shortness of breath. This information is not intended to replace advice given to you by your health care provider. Make sure you discuss any questions you have with your health care provider. Document Released: 05/18/2015 Document Revised: 09/17/2015 Document Reviewed: 02/08/2015 Elsevier Patient Education  2020 [employer] Inc.  General Anesthesia, Adult, Care After This sheet gives you information about how to care for yourself after your procedure. Your health care provider may also give you more specific instructions. If you have problems or questions, contact your health care provider. What can I expect after the procedure? After the procedure, the following side effects are common:  Pain or discomfort at the IV site.  Nausea.  Vomiting.  Sore throat.  Trouble concentrating.  Feeling cold or chills.  Weak or tired.  Sleepiness and fatigue.  Soreness and body aches. These side effects can affect parts of the body that were not involved in surgery. Follow these instructions at home:  For at least 24 hours after the procedure:  Have a responsible adult stay with you. It is important to have someone help care for you until you are awake and alert.  Rest as needed.  Do not: ? Participate in activities in which you could fall or become injured. ? Drive. ? Use heavy machinery. ? Drink alcohol. ? Take sleeping pills or medicines that cause drowsiness. ? Make important decisions or sign legal documents. ? Take care of children on your own. Eating and drinking  Follow any instructions from  your health care provider about eating or drinking restrictions.  When you feel hungry, start by eating small amounts of foods that are soft and easy to digest (bland), such as toast. Gradually return to your regular diet.  Drink enough fluid to keep your urine pale yellow.  If you vomit, rehydrate by drinking water, juice, or clear broth. General instructions  If you have sleep apnea, surgery and certain medicines can increase your risk for breathing problems. Follow instructions from your  health care provider about wearing your sleep device: ? Anytime you are sleeping, including during daytime naps. ? While taking prescription pain medicines, sleeping medicines, or medicines that make you drowsy.  Return to your normal activities as told by your health care provider. Ask your health care provider what activities are safe for you.  Take over-the-counter and prescription medicines only as told by your health care provider.  If you smoke, do not smoke without supervision.  Keep all follow-up visits as told by your health care provider. This is important. Contact a health care provider if:  You have nausea or vomiting that does not get better with medicine.  You cannot eat or drink without vomiting.  You have pain that does not get better with medicine.  You are unable to pass urine.  You develop a skin rash.  You have a fever.  You have redness around your IV site that gets worse. Get help right away if:  You have difficulty breathing.  You have chest pain.  You have blood in your urine or stool, or you vomit blood. Summary  After the procedure, it is common to have a sore throat or nausea. It is also common to feel tired.  Have a responsible adult stay with you for the first 24 hours after general anesthesia. It is important to have someone help care for you until you are awake and alert.  When you feel hungry, start by eating small amounts of foods that are soft and  easy to digest (bland), such as toast. Gradually return to your regular diet.  Drink enough fluid to keep your urine pale yellow.  Return to your normal activities as told by your health care provider. Ask your health care provider what activities are safe for you. This information is not intended to replace advice given to you by your health care provider. Make sure you discuss any questions you have with your health care provider. Document Released: 05/02/2000 Document Revised: 01/27/2017 Document Reviewed: 09/09/2016 Elsevier Patient Education  2020 [employer].  How to Use Chlorhexidine for Bathing Chlorhexidine gluconate (CHG) is a germ-killing (antiseptic) solution that is used to clean the skin. It can get rid of the bacteria that normally live on the skin and can keep them away for about 24 hours. To clean your skin with CHG, you may be given:  A CHG solution to use in the shower or as part of a sponge bath.  A prepackaged cloth that contains CHG. Cleaning your skin with CHG may help lower the risk for infection:  While you are staying in the intensive care unit of the hospital.  If you have a vascular access, such as a central line, to provide short-term or long-term access to your veins.  If you have a catheter to drain urine from your bladder.  If you are on a ventilator. A ventilator is a machine that helps you breathe by moving air in and out of your lungs.  After surgery. What are the risks? Risks of using CHG include:  A skin reaction.  Hearing loss, if CHG gets in your ears.  Eye injury, if CHG gets in your eyes and is not rinsed out.  The CHG product catching fire. Make sure that you avoid smoking and flames after applying CHG to your skin. Do not use CHG:  If you have a chlorhexidine allergy or have previously reacted to chlorhexidine.  On babies younger than 27 months of age. How to use CHG  solution  Use CHG only as told by your health care provider,  and follow the instructions on the label.  Use the full amount of CHG as directed. Usually, this is one bottle. During a shower Follow these steps when using CHG solution during a shower (unless your health care provider gives you different instructions): 1. Start the shower. 2. Use your normal soap and shampoo to wash your face and hair. 3. Turn off the shower or move out of the shower stream. 4. Pour the CHG onto a clean washcloth. Do not use any type of brush or rough-edged sponge. 5. Starting at your neck, lather your body down to your toes. Make sure you follow these instructions: ? If you will be having surgery, pay special attention to the part of your body where you will be having surgery. Scrub this area for at least 1 minute. ? Do not use CHG on your head or face. If the solution gets into your ears or eyes, rinse them well with water. ? Avoid your genital area. ? Avoid any areas of skin that have broken skin, cuts, or scrapes. ? Scrub your back and under your arms. Make sure to wash skin folds. 6. Let the lather sit on your skin for 1-2 minutes or as long as told by your health care provider. 7. Thoroughly rinse your entire body in the shower. Make sure that all body creases and crevices are rinsed well. 8. Dry off with a clean towel. Do not put any substances on your body afterward-such as powder, lotion, or perfume-unless you are told to do so by your health care provider. Only use lotions that are recommended by the manufacturer. 9. Put on clean clothes or pajamas. 10. If it is the night before your surgery, sleep in clean sheets.  During a sponge bath Follow these steps when using CHG solution during a sponge bath (unless your health care provider gives you different instructions): 1. Use your normal soap and shampoo to wash your face and hair. 2. Pour the CHG onto a clean washcloth. 3. Starting at your neck, lather your body down to your toes. Make sure you follow these  instructions: ? If you will be having surgery, pay special attention to the part of your body where you will be having surgery. Scrub this area for at least 1 minute. ? Do not use CHG on your head or face. If the solution gets into your ears or eyes, rinse them well with water. ? Avoid your genital area. ? Avoid any areas of skin that have broken skin, cuts, or scrapes. ? Scrub your back and under your arms. Make sure to wash skin folds. 4. Let the lather sit on your skin for 1-2 minutes or as long as told by your health care provider. 5. Using a different clean, wet washcloth, thoroughly rinse your entire body. Make sure that all body creases and crevices are rinsed well. 6. Dry off with a clean towel. Do not put any substances on your body afterward-such as powder, lotion, or perfume-unless you are told to do so by your health care provider. Only use lotions that are recommended by the manufacturer. 7. Put on clean clothes or pajamas. 8. If it is the night before your surgery, sleep in clean sheets. How to use CHG prepackaged cloths  Only use CHG cloths as told by your health care provider, and follow the instructions on the label.  Use the CHG cloth on clean, dry skin.  Do not use the CHG cloth on your head or face unless your health care provider tells you to.  When washing with the CHG cloth: ? Avoid your genital area. ? Avoid any areas of skin that have broken skin, cuts, or scrapes. Before surgery Follow these steps when using a CHG cloth to clean before surgery (unless your health care provider gives you different instructions): 1. Using the CHG cloth, vigorously scrub the part of your body where you will be having surgery. Scrub using a back-and-forth motion for 3 minutes. The area on your body should be completely wet with CHG when you are done scrubbing. 2. Do not rinse. Discard the cloth and let the area air-dry. Do not put any substances on the area afterward, such as powder,  lotion, or perfume. 3. Put on clean clothes or pajamas. 4. If it is the night before your surgery, sleep in clean sheets.  For general bathing Follow these steps when using CHG cloths for general bathing (unless your health care provider gives you different instructions). 1. Use a separate CHG cloth for each area of your body. Make sure you wash between any folds of skin and between your fingers and toes. Wash your body in the following order, switching to a new cloth after each step: ? The front of your neck, shoulders, and chest. ? Both of your arms, under your arms, and your hands. ? Your stomach and groin area, avoiding the genitals. ? Your right leg and foot. ? Your left leg and foot. ? The back of your neck, your back, and your buttocks. 2. Do not rinse. Discard the cloth and let the area air-dry. Do not put any substances on your body afterward-such as powder, lotion, or perfume-unless you are told to do so by your health care provider. Only use lotions that are recommended by the manufacturer. 3. Put on clean clothes or pajamas. Contact a health care provider if:  Your skin gets irritated after scrubbing.  You have questions about using your solution or cloth. Get help right away if:  Your eyes become very red or swollen.  Your eyes itch badly.  Your skin itches badly and is red or swollen.  Your hearing changes.  You have trouble seeing.  You have swelling or tingling in your mouth or throat.  You have trouble breathing.  You swallow any chlorhexidine. Summary  Chlorhexidine gluconate (CHG) is a germ-killing (antiseptic) solution that is used to clean the skin. Cleaning your skin with CHG may help to lower your risk for infection.  You may be given CHG to use for bathing. It may be in a bottle or in a prepackaged cloth to use on your skin. Carefully follow your health care provider's instructions and the instructions on the product label.  Do not use CHG if you  have a chlorhexidine allergy.  Contact your health care provider if your skin gets irritated after scrubbing. This information is not intended to replace advice given to you by your health care provider. Make sure you discuss any questions you have with your health care provider. Document Released: 10/19/2011 Document Revised: 04/12/2018 Document Reviewed: 12/22/2016 Elsevier Patient Education  2020 [employer].

## 2018-10-12 ENCOUNTER — Encounter (HOSPITAL_COMMUNITY): Payer: Self-pay

## 2018-10-12 ENCOUNTER — Other Ambulatory Visit: Payer: Self-pay | Admitting: Physician Assistant

## 2018-10-12 ENCOUNTER — Telehealth: Payer: Self-pay | Admitting: Cardiology

## 2018-10-12 ENCOUNTER — Other Ambulatory Visit: Payer: Self-pay

## 2018-10-12 ENCOUNTER — Encounter (HOSPITAL_COMMUNITY)
Admission: RE | Admit: 2018-10-12 | Discharge: 2018-10-12 | Disposition: A | Discharge status: Home / Self Care | Payer: Self-pay | Source: Ambulatory Visit | Attending: Obstetrics & Gynecology | Admitting: Obstetrics & Gynecology

## 2018-10-12 DIAGNOSIS — N9985 Post endometrial ablation syndrome: Secondary | ICD-10-CM | POA: Insufficient documentation

## 2018-10-12 DIAGNOSIS — I498 Other specified cardiac arrhythmias: Secondary | ICD-10-CM | POA: Insufficient documentation

## 2018-10-12 DIAGNOSIS — N938 Other specified abnormal uterine and vaginal bleeding: Secondary | ICD-10-CM | POA: Insufficient documentation

## 2018-10-12 DIAGNOSIS — N946 Dysmenorrhea, unspecified: Secondary | ICD-10-CM | POA: Insufficient documentation

## 2018-10-12 DIAGNOSIS — Z01818 Encounter for other preprocedural examination: Secondary | ICD-10-CM | POA: Insufficient documentation

## 2018-10-12 HISTORY — DX: Fatty (change of) liver, not elsewhere classified: K76.0

## 2018-10-12 LAB — CBC
HCT: 39.1 % (ref 36.0–46.0)
Hemoglobin: 11.6 g/dL — ABNORMAL LOW (ref 12.0–15.0)
MCH: 23.3 pg — ABNORMAL LOW (ref 26.0–34.0)
MCHC: 29.7 g/dL — ABNORMAL LOW (ref 30.0–36.0)
MCV: 78.7 fL — ABNORMAL LOW (ref 80.0–100.0)
Platelets: 372 10*3/uL (ref 150–400)
RBC: 4.97 MIL/uL (ref 3.87–5.11)
RDW: 17.1 % — ABNORMAL HIGH (ref 11.5–15.5)
WBC: 9 10*3/uL (ref 4.0–10.5)
nRBC: 0 % (ref 0.0–0.2)

## 2018-10-12 LAB — TYPE AND SCREEN
ABO/RH(D): A POS
Antibody Screen: NEGATIVE

## 2018-10-12 LAB — COMPREHENSIVE METABOLIC PANEL
ALT: 21 U/L (ref 0–44)
AST: 18 U/L (ref 15–41)
Albumin: 3.9 g/dL (ref 3.5–5.0)
Alkaline Phosphatase: 86 U/L (ref 38–126)
Anion gap: 9 (ref 5–15)
BUN: 12 mg/dL (ref 6–20)
CO2: 21 mmol/L — ABNORMAL LOW (ref 22–32)
Calcium: 8.9 mg/dL (ref 8.9–10.3)
Chloride: 110 mmol/L (ref 98–111)
Creatinine, Ser: 0.69 mg/dL (ref 0.44–1.00)
GFR calc Af Amer: >60 mL/min (ref >60)
GFR calc non Af Amer: >60 mL/min (ref >60)
Glucose, Bld: 82 mg/dL (ref 70–99)
Potassium: 3.6 mmol/L (ref 3.5–5.1)
Sodium: 140 mmol/L (ref 135–145)
Total Bilirubin: 0.3 mg/dL (ref 0.3–1.2)
Total Protein: 7.2 g/dL (ref 6.5–8.1)

## 2018-10-12 LAB — URINALYSIS, ROUTINE W REFLEX MICROSCOPIC
Bacteria, UA: NONE SEEN
Bilirubin Urine: NEGATIVE
Glucose, UA: NEGATIVE mg/dL
Ketones, ur: NEGATIVE mg/dL
Leukocytes,Ua: NEGATIVE
Nitrite: NEGATIVE
Protein, ur: NEGATIVE mg/dL
Specific Gravity, Urine: 1.008 (ref 1.005–1.030)
pH: 6 (ref 5.0–8.0)

## 2018-10-12 LAB — RAPID HIV SCREEN (HIV 1/2 AB+AG)
HIV 1/2 Antibodies: NONREACTIVE
HIV-1 P24 Antigen - HIV24: NONREACTIVE

## 2018-10-12 LAB — HCG, QUANTITATIVE, PREGNANCY: hCG, Beta Chain, Quant, S: 1 m[IU]/mL (ref <5)

## 2018-10-12 MED ORDER — KETOROLAC TROMETHAMINE 30 MG/ML IJ SOLN: 30.0000 mg | Freq: Once | INTRAMUSCULAR | Status: DC

## 2018-10-12 NOTE — Progress Notes (Signed)
   10/12/18 0907  OBSTRUCTIVE SLEEP APNEA  Have you ever been diagnosed with sleep apnea through a sleep study? No  Do you snore loudly (loud enough to be heard through closed doors)?  1  Do you often feel tired, fatigued, or sleepy during the daytime (such as falling asleep during driving or talking to someone)? 1  Has anyone observed you stop breathing during your sleep? 1  Do you have, or are you being treated for high blood pressure? 1  BMI more than 35 kg/m2? 1  Age > 50 (1-yes) 0  Neck circumference greater than:Female 16 inches or larger, Female 17inches or larger? 1  Female Gender (Yes=1) 0  Obstructive Sleep Apnea Score 6

## 2018-10-12 NOTE — Progress Notes (Signed)
Dr. Elonda Husky notified of concerns regarding pt's recent hospitalization for SVT and possible sleep apnea. Dr. Elonda Husky would like to proceed and is "fine with spinal". [facility] called to get an appointment for follow up from hospitalization. Virtual visit made for 10/25/18.

## 2018-10-12 NOTE — Telephone Encounter (Signed)

## 2018-10-12 NOTE — Progress Notes (Signed)
Dr. Hilaria Ota consulted regarding patient's recent hospitalization for SVT and describes classic symptoms of obstructive sleep apnea and even obstructing while awake but about to fall asleep. Pt was to follow up with cardiology after hospitalization and prexcribed metoprolol but didn't pick up the medication. The pt is to pick up the med and start before surgery. Will call cardiologist to set up appointment for consult and possible sleep study.

## 2018-10-16 ENCOUNTER — Other Ambulatory Visit (HOSPITAL_COMMUNITY)
Admission: RE | Admit: 2018-10-16 | Discharge: 2018-10-16 | Disposition: A | Discharge status: Home / Self Care | Payer: HRSA Program | Source: Ambulatory Visit | Attending: Obstetrics & Gynecology | Admitting: Obstetrics & Gynecology

## 2018-10-16 ENCOUNTER — Other Ambulatory Visit: Payer: Self-pay

## 2018-10-16 DIAGNOSIS — Z20828 Contact with and (suspected) exposure to other viral communicable diseases: Secondary | ICD-10-CM | POA: Insufficient documentation

## 2018-10-16 DIAGNOSIS — Z01812 Encounter for preprocedural laboratory examination: Secondary | ICD-10-CM | POA: Diagnosis present

## 2018-10-17 ENCOUNTER — Encounter (HOSPITAL_COMMUNITY)
Admission: RE | Disposition: A | Discharge status: Home / Self Care | Payer: Self-pay | Source: Home / Self Care | Attending: Obstetrics & Gynecology

## 2018-10-17 ENCOUNTER — Encounter (HOSPITAL_COMMUNITY): Payer: Self-pay

## 2018-10-17 ENCOUNTER — Other Ambulatory Visit: Payer: Self-pay

## 2018-10-17 ENCOUNTER — Observation Stay (HOSPITAL_COMMUNITY)
Admission: RE | Admit: 2018-10-17 | Discharge: 2018-10-18 | Disposition: A | Discharge status: Home / Self Care | Payer: Self-pay | Attending: Obstetrics & Gynecology | Admitting: Obstetrics & Gynecology

## 2018-10-17 ENCOUNTER — Observation Stay (HOSPITAL_COMMUNITY): Payer: Self-pay | Admitting: Anesthesiology

## 2018-10-17 DIAGNOSIS — N72 Inflammatory disease of cervix uteri: Secondary | ICD-10-CM

## 2018-10-17 DIAGNOSIS — F419 Anxiety disorder, unspecified: Secondary | ICD-10-CM | POA: Insufficient documentation

## 2018-10-17 DIAGNOSIS — N946 Dysmenorrhea, unspecified: Secondary | ICD-10-CM

## 2018-10-17 DIAGNOSIS — N800 Endometriosis of uterus: Secondary | ICD-10-CM | POA: Insufficient documentation

## 2018-10-17 DIAGNOSIS — N888 Other specified noninflammatory disorders of cervix uteri: Secondary | ICD-10-CM | POA: Insufficient documentation

## 2018-10-17 DIAGNOSIS — G4733 Obstructive sleep apnea (adult) (pediatric): Secondary | ICD-10-CM | POA: Insufficient documentation

## 2018-10-17 DIAGNOSIS — Z87891 Personal history of nicotine dependence: Secondary | ICD-10-CM | POA: Insufficient documentation

## 2018-10-17 DIAGNOSIS — Z881 Allergy status to other antibiotic agents status: Secondary | ICD-10-CM | POA: Insufficient documentation

## 2018-10-17 DIAGNOSIS — N9985 Post endometrial ablation syndrome: Secondary | ICD-10-CM

## 2018-10-17 DIAGNOSIS — N938 Other specified abnormal uterine and vaginal bleeding: Secondary | ICD-10-CM | POA: Insufficient documentation

## 2018-10-17 DIAGNOSIS — F329 Major depressive disorder, single episode, unspecified: Secondary | ICD-10-CM | POA: Insufficient documentation

## 2018-10-17 DIAGNOSIS — M1990 Unspecified osteoarthritis, unspecified site: Secondary | ICD-10-CM | POA: Insufficient documentation

## 2018-10-17 DIAGNOSIS — I10 Essential (primary) hypertension: Secondary | ICD-10-CM | POA: Insufficient documentation

## 2018-10-17 DIAGNOSIS — D649 Anemia, unspecified: Secondary | ICD-10-CM | POA: Insufficient documentation

## 2018-10-17 DIAGNOSIS — K219 Gastro-esophageal reflux disease without esophagitis: Secondary | ICD-10-CM | POA: Insufficient documentation

## 2018-10-17 DIAGNOSIS — Z90710 Acquired absence of both cervix and uterus: Secondary | ICD-10-CM | POA: Diagnosis present

## 2018-10-17 HISTORY — PX: VAGINAL HYSTERECTOMY: SHX2639

## 2018-10-17 LAB — SARS CORONAVIRUS 2 (TAT 6-24 HRS): SARS Coronavirus 2: NEGATIVE

## 2018-10-17 SURGERY — HYSTERECTOMY, VAGINAL
Anesthesia: General | Site: Vagina

## 2018-10-17 MED ORDER — PROPOFOL 10 MG/ML IV BOLUS
INTRAVENOUS | Status: DC | PRN
  Administered 2018-10-17 (×2): 20 mg via INTRAVENOUS
  Administered 2018-10-17: 200 mg via INTRAVENOUS

## 2018-10-17 MED ORDER — KETOROLAC TROMETHAMINE 30 MG/ML IJ SOLN
INTRAMUSCULAR | Status: AC
  Filled 2018-10-17: qty 1

## 2018-10-17 MED ORDER — SUCCINYLCHOLINE CHLORIDE 200 MG/10ML IV SOSY
PREFILLED_SYRINGE | INTRAVENOUS | Status: DC | PRN
  Administered 2018-10-17: 180 mg via INTRAVENOUS

## 2018-10-17 MED ORDER — SODIUM CHLORIDE 0.9 % IR SOLN: Status: DC | PRN

## 2018-10-17 MED ORDER — DOCUSATE SODIUM 100 MG PO CAPS
100.0000 mg | ORAL_CAPSULE | Freq: Two times a day (BID) | ORAL | Status: DC
  Administered 2018-10-17 – 2018-10-18 (×2): 100 mg via ORAL
  Filled 2018-10-17 (×2): qty 1

## 2018-10-17 MED ORDER — SUGAMMADEX SODIUM 500 MG/5ML IV SOLN
INTRAVENOUS | Status: DC | PRN
  Administered 2018-10-17: 300 mg via INTRAVENOUS

## 2018-10-17 MED ORDER — IBUPROFEN 800 MG PO TABS
800.0000 mg | ORAL_TABLET | Freq: Four times a day (QID) | ORAL | Status: DC
  Filled 2018-10-17: qty 1

## 2018-10-17 MED ORDER — DEXAMETHASONE SODIUM PHOSPHATE 4 MG/ML IJ SOLN
INTRAMUSCULAR | Status: DC | PRN
  Administered 2018-10-17: 8 mg via INTRAVENOUS

## 2018-10-17 MED ORDER — MIDAZOLAM HCL 2 MG/2ML IJ SOLN
INTRAMUSCULAR | Status: AC
  Filled 2018-10-17: qty 2

## 2018-10-17 MED ORDER — METOPROLOL TARTRATE 5 MG/5ML IV SOLN
INTRAVENOUS | Status: DC | PRN
  Administered 2018-10-17 (×2): 2.5 mg via INTRAVENOUS

## 2018-10-17 MED ORDER — SUCCINYLCHOLINE CHLORIDE 200 MG/10ML IV SOSY
PREFILLED_SYRINGE | INTRAVENOUS | Status: AC
  Filled 2018-10-17: qty 10

## 2018-10-17 MED ORDER — HYDROMORPHONE HCL 1 MG/ML IJ SOLN
0.2500 mg | INTRAMUSCULAR | Status: DC | PRN
  Administered 2018-10-17 (×4): 0.5 mg via INTRAVENOUS
  Filled 2018-10-17 (×4): qty 0.5

## 2018-10-17 MED ORDER — PROMETHAZINE HCL 25 MG/ML IJ SOLN: 25.0000 mg | Freq: Four times a day (QID) | INTRAMUSCULAR | Status: DC | PRN

## 2018-10-17 MED ORDER — ONDANSETRON HCL 4 MG/2ML IJ SOLN
INTRAMUSCULAR | Status: AC
  Filled 2018-10-17: qty 2

## 2018-10-17 MED ORDER — PROPOFOL 10 MG/ML IV BOLUS
INTRAVENOUS | Status: AC
  Filled 2018-10-17: qty 20

## 2018-10-17 MED ORDER — LACTATED RINGERS IV SOLN
INTRAVENOUS | Status: DC
  Administered 2018-10-17 (×4): via INTRAVENOUS

## 2018-10-17 MED ORDER — CEFAZOLIN SODIUM-DEXTROSE 1-4 GM/50ML-% IV SOLN
INTRAVENOUS | Status: AC
  Filled 2018-10-17: qty 50

## 2018-10-17 MED ORDER — ONDANSETRON HCL 4 MG PO TABS: 8.0000 mg | ORAL_TABLET | Freq: Four times a day (QID) | ORAL | Status: DC | PRN

## 2018-10-17 MED ORDER — BUPIVACAINE-EPINEPHRINE (PF) 0.5% -1:200000 IJ SOLN
INTRAMUSCULAR | Status: DC | PRN
  Administered 2018-10-17: 17 mL via PERINEURAL

## 2018-10-17 MED ORDER — ACETAMINOPHEN 10 MG/ML IV SOLN
INTRAVENOUS | Status: DC | PRN
  Administered 2018-10-17: 1000 mg via INTRAVENOUS

## 2018-10-17 MED ORDER — ROCURONIUM BROMIDE 10 MG/ML (PF) SYRINGE
PREFILLED_SYRINGE | INTRAVENOUS | Status: AC
  Filled 2018-10-17: qty 10

## 2018-10-17 MED ORDER — SUGAMMADEX SODIUM 500 MG/5ML IV SOLN
INTRAVENOUS | Status: AC
  Filled 2018-10-17: qty 5

## 2018-10-17 MED ORDER — KETOROLAC TROMETHAMINE 30 MG/ML IJ SOLN
30.0000 mg | Freq: Four times a day (QID) | INTRAMUSCULAR | Status: AC
  Administered 2018-10-17 – 2018-10-18 (×4): 30 mg via INTRAVENOUS
  Filled 2018-10-17 (×4): qty 1

## 2018-10-17 MED ORDER — FENTANYL CITRATE (PF) 100 MCG/2ML IJ SOLN
50.0000 ug | INTRAMUSCULAR | Status: DC | PRN
  Administered 2018-10-17: 100 ug via INTRAVENOUS
  Filled 2018-10-17: qty 2

## 2018-10-17 MED ORDER — DEXTROSE 5 % IV SOLN
3.0000 g | INTRAVENOUS | Status: AC
  Administered 2018-10-17: 2 g via INTRAVENOUS
  Administered 2018-10-17: 1 g via INTRAVENOUS
  Filled 2018-10-17: qty 3000

## 2018-10-17 MED ORDER — CEFAZOLIN SODIUM-DEXTROSE 2-4 GM/100ML-% IV SOLN
INTRAVENOUS | Status: AC
  Filled 2018-10-17: qty 100

## 2018-10-17 MED ORDER — ROCURONIUM BROMIDE 100 MG/10ML IV SOLN
INTRAVENOUS | Status: DC | PRN
  Administered 2018-10-17 (×2): 10 mg via INTRAVENOUS
  Administered 2018-10-17: 30 mg via INTRAVENOUS

## 2018-10-17 MED ORDER — SENNOSIDES-DOCUSATE SODIUM 8.6-50 MG PO TABS: 1.0000 | ORAL_TABLET | Freq: Every evening | ORAL | Status: DC | PRN

## 2018-10-17 MED ORDER — ENOXAPARIN SODIUM 40 MG/0.4ML ~~LOC~~ SOLN
40.0000 mg | SUBCUTANEOUS | Status: DC
  Administered 2018-10-18: 40 mg via SUBCUTANEOUS
  Filled 2018-10-17: qty 0.4

## 2018-10-17 MED ORDER — 0.9 % SODIUM CHLORIDE (POUR BTL) OPTIME
TOPICAL | Status: DC | PRN
  Administered 2018-10-17: 1000 mL

## 2018-10-17 MED ORDER — MIDAZOLAM HCL 2 MG/2ML IJ SOLN
INTRAMUSCULAR | Status: DC | PRN
  Administered 2018-10-17: 1 mg via INTRAVENOUS

## 2018-10-17 MED ORDER — DEXAMETHASONE SODIUM PHOSPHATE 10 MG/ML IJ SOLN
INTRAMUSCULAR | Status: AC
  Filled 2018-10-17: qty 1

## 2018-10-17 MED ORDER — LIDOCAINE HCL (CARDIAC) PF 100 MG/5ML IV SOSY
PREFILLED_SYRINGE | INTRAVENOUS | Status: DC | PRN
  Administered 2018-10-17: 100 mg via INTRAVENOUS

## 2018-10-17 MED ORDER — KCL IN DEXTROSE-NACL 40-5-0.45 MEQ/L-%-% IV SOLN
INTRAVENOUS | Status: DC
  Administered 2018-10-17 – 2018-10-18 (×2): via INTRAVENOUS

## 2018-10-17 MED ORDER — FENTANYL CITRATE (PF) 100 MCG/2ML IJ SOLN
INTRAMUSCULAR | Status: DC | PRN
  Administered 2018-10-17: 25 ug via INTRAVENOUS
  Administered 2018-10-17: 50 ug via INTRAVENOUS
  Administered 2018-10-17 (×3): 25 ug via INTRAVENOUS
  Administered 2018-10-17 (×4): 50 ug via INTRAVENOUS

## 2018-10-17 MED ORDER — GABAPENTIN 100 MG PO CAPS
100.0000 mg | ORAL_CAPSULE | Freq: Three times a day (TID) | ORAL | Status: DC
  Administered 2018-10-17 – 2018-10-18 (×3): 100 mg via ORAL
  Filled 2018-10-17 (×3): qty 1

## 2018-10-17 MED ORDER — ONDANSETRON HCL 4 MG/2ML IJ SOLN
INTRAMUSCULAR | Status: DC | PRN
  Administered 2018-10-17: 4 mg via INTRAVENOUS

## 2018-10-17 MED ORDER — ACETAMINOPHEN 10 MG/ML IV SOLN
INTRAVENOUS | Status: AC
  Filled 2018-10-17: qty 100

## 2018-10-17 MED ORDER — BUPIVACAINE HCL (PF) 0.5 % IJ SOLN
INTRAMUSCULAR | Status: AC
  Filled 2018-10-17: qty 30

## 2018-10-17 MED ORDER — BUPIVACAINE-EPINEPHRINE (PF) 0.5% -1:200000 IJ SOLN
INTRAMUSCULAR | Status: AC
  Filled 2018-10-17: qty 30

## 2018-10-17 MED ORDER — SODIUM CHLORIDE 0.9 % IR SOLN
Status: DC | PRN
  Administered 2018-10-17: 3000 mL

## 2018-10-17 MED ORDER — OXYCODONE HCL 5 MG PO TABS
5.0000 mg | ORAL_TABLET | ORAL | Status: DC | PRN
  Administered 2018-10-17 – 2018-10-18 (×3): 5 mg via ORAL
  Filled 2018-10-17: qty 1
  Filled 2018-10-17: qty 2
  Filled 2018-10-17: qty 1

## 2018-10-17 MED ORDER — FENTANYL CITRATE (PF) 100 MCG/2ML IJ SOLN
INTRAMUSCULAR | Status: AC
  Filled 2018-10-17: qty 2

## 2018-10-17 MED ORDER — DIPHENHYDRAMINE HCL 50 MG/ML IJ SOLN: 25.0000 mg | Freq: Four times a day (QID) | INTRAMUSCULAR | Status: DC | PRN

## 2018-10-17 MED ORDER — FENTANYL CITRATE (PF) 250 MCG/5ML IJ SOLN
INTRAMUSCULAR | Status: AC
  Filled 2018-10-17: qty 5

## 2018-10-17 MED ORDER — HYDROCODONE-ACETAMINOPHEN 7.5-325 MG PO TABS: 1.0000 | ORAL_TABLET | Freq: Once | ORAL | Status: DC | PRN

## 2018-10-17 MED ORDER — SODIUM CHLORIDE 0.9% FLUSH
INTRAVENOUS | Status: AC
  Filled 2018-10-17: qty 10

## 2018-10-17 MED ORDER — KETOROLAC TROMETHAMINE 30 MG/ML IJ SOLN
30.0000 mg | Freq: Once | INTRAMUSCULAR | Status: AC
  Administered 2018-10-17: 30 mg via INTRAVENOUS

## 2018-10-17 MED ORDER — PROMETHAZINE HCL 25 MG/ML IJ SOLN
6.2500 mg | INTRAMUSCULAR | Status: DC | PRN
  Administered 2018-10-17: 6.25 mg via INTRAVENOUS
  Filled 2018-10-17: qty 1

## 2018-10-17 MED ORDER — ALUM & MAG HYDROXIDE-SIMETH 200-200-20 MG/5ML PO SUSP: 30.0000 mL | ORAL | Status: DC | PRN

## 2018-10-17 MED ORDER — METOPROLOL TARTRATE 5 MG/5ML IV SOLN
INTRAVENOUS | Status: AC
  Filled 2018-10-17: qty 5

## 2018-10-17 MED ORDER — MIDAZOLAM HCL 2 MG/2ML IJ SOLN: 0.5000 mg | Freq: Once | INTRAMUSCULAR | Status: DC | PRN

## 2018-10-17 MED ORDER — LEVOFLOXACIN IN D5W 750 MG/150ML IV SOLN
750.0000 mg | Freq: Once | INTRAVENOUS | Status: AC
  Administered 2018-10-17: 750 mg via INTRAVENOUS
  Filled 2018-10-17: qty 150

## 2018-10-17 MED ORDER — LABETALOL HCL 5 MG/ML IV SOLN
INTRAVENOUS | Status: AC
  Filled 2018-10-17: qty 4

## 2018-10-17 MED ORDER — LIDOCAINE 2% (20 MG/ML) 5 ML SYRINGE
INTRAMUSCULAR | Status: AC
  Filled 2018-10-17: qty 5

## 2018-10-17 MED ORDER — SODIUM CHLORIDE 0.9 % IV SOLN: 8.0000 mg | Freq: Four times a day (QID) | INTRAVENOUS | Status: DC | PRN

## 2018-10-17 MED ORDER — BISACODYL 10 MG RE SUPP: 10.0000 mg | Freq: Every day | RECTAL | Status: DC | PRN

## 2018-10-17 SURGICAL SUPPLY — 40 items
APPLIER CLIP 13 LRG OPEN (CLIP) ×2
BLADE SURG SZ10 CARB STEEL (BLADE) ×2 IMPLANT
CLIP APPLIE 13 LRG OPEN (CLIP) ×1 IMPLANT
CLOTH BEACON ORANGE TIMEOUT ST (SAFETY) ×2 IMPLANT
COVER LIGHT HANDLE STERIS (MISCELLANEOUS) ×4 IMPLANT
COVER WAND RF STERILE (DRAPES) ×2 IMPLANT
DECANTER SPIKE VIAL GLASS SM (MISCELLANEOUS) ×2 IMPLANT
DRAPE HALF SHEET 40X57 (DRAPES) ×2 IMPLANT
DRAPE STERI URO 9X17 APER PCH (DRAPES) ×2 IMPLANT
ELECT REM PT RETURN 9FT ADLT (ELECTROSURGICAL) ×2
ELECTRODE REM PT RTRN 9FT ADLT (ELECTROSURGICAL) ×1 IMPLANT
GAUZE 4X4 16PLY RFD (DISPOSABLE) ×2 IMPLANT
GLOVE BIO SURGEON STRL SZ7 (GLOVE) ×2 IMPLANT
GLOVE BIOGEL PI IND STRL 7.0 (GLOVE) ×4 IMPLANT
GLOVE BIOGEL PI IND STRL 8 (GLOVE) ×1 IMPLANT
GLOVE BIOGEL PI INDICATOR 7.0 (GLOVE) ×4
GLOVE BIOGEL PI INDICATOR 8 (GLOVE) ×1
GLOVE ECLIPSE 6.5 STRL STRAW (GLOVE) ×2 IMPLANT
GLOVE ECLIPSE 8.0 STRL XLNG CF (GLOVE) ×2 IMPLANT
GOWN STRL REUS W/TWL LRG LVL3 (GOWN DISPOSABLE) ×4 IMPLANT
GOWN STRL REUS W/TWL XL LVL3 (GOWN DISPOSABLE) ×2 IMPLANT
IV NS IRRIG 3000ML ARTHROMATIC (IV SOLUTION) ×2 IMPLANT
KIT BLADEGUARD II DBL (SET/KITS/TRAYS/PACK) ×2 IMPLANT
KIT TURNOVER CYSTO (KITS) ×2 IMPLANT
MANIFOLD NEPTUNE II (INSTRUMENTS) ×2 IMPLANT
NEEDLE HYPO 22GX1.5 SAFETY (NEEDLE) ×2 IMPLANT
NS IRRIG 1000ML POUR BTL (IV SOLUTION) ×2 IMPLANT
PACK PERI GYN (CUSTOM PROCEDURE TRAY) ×2 IMPLANT
PAD ARMBOARD 7.5X6 YLW CONV (MISCELLANEOUS) ×8 IMPLANT
SET BASIN LINEN APH (SET/KITS/TRAYS/PACK) ×2 IMPLANT
SUT MNCRL+ AB 3-0 CT1 36 (SUTURE) ×1 IMPLANT
SUT MON AB 3-0 SH 27 (SUTURE) IMPLANT
SUT MONOCRYL AB 3-0 CT1 36IN (SUTURE) ×1
SUT VIC AB 0 CT1 27 (SUTURE) ×4
SUT VIC AB 0 CT1 27XCR 8 STRN (SUTURE) ×4 IMPLANT
SYR CONTROL 10ML LL (SYRINGE) ×2 IMPLANT
TOWEL OR 17X26 4PK STRL BLUE (TOWEL DISPOSABLE) ×2 IMPLANT
TRAY FOLEY MTR SLVR 16FR STAT (SET/KITS/TRAYS/PACK) ×2 IMPLANT
VERSALIGHT (MISCELLANEOUS) ×2 IMPLANT
WATER STERILE IRR 1000ML POUR (IV SOLUTION) ×2 IMPLANT

## 2018-10-17 NOTE — Anesthesia Preprocedure Evaluation (Signed)
Anesthesia Evaluation  Patient identified by MRN, date of birth, ID band Patient awake    Reviewed: Allergy & Precautions, NPO status , Patient's Chart, lab work & pertinent test results  Airway Mallampati: II  TM Distance: >3 FB Neck ROM: Full    Dental no notable dental hx. (+) Missing, Poor Dentition, Chipped   Pulmonary neg pulmonary ROS, former smoker,  Describes classic OSA Sx -never Dx'd or Tx'd   Pulmonary exam normal breath sounds clear to auscultation       Cardiovascular Exercise Tolerance: Good hypertension, Pt. on medications Normal cardiovascular exam+ dysrhythmias Supra Ventricular Tachycardia I Rhythm:Regular Rate:Normal  Reports walks 2 miles /day  Denies CP or DOE Unable to see cardiology, not on meds, explained proceeding at increased risk - voiced understanding -WTP with same -Dr. Elonda Husky aware-WTP   Neuro/Psych Anxiety Depression negative neurological ROS  negative psych ROS   GI/Hepatic Neg liver ROS, GERD  Medicated and Controlled,  Endo/Other  negative endocrine ROS  Renal/GU negative Renal ROS  negative genitourinary   Musculoskeletal  (+) Arthritis , Osteoarthritis,    Abdominal   Peds negative pediatric ROS (+)  Hematology negative hematology ROS (+) anemia ,   Anesthesia Other Findings   Reproductive/Obstetrics negative OB ROS                             Anesthesia Physical Anesthesia Plan  ASA: III  Anesthesia Plan: General   Post-op Pain Management:    Induction: Intravenous  PONV Risk Score and Plan: 3 and Midazolam, Ondansetron, Dexamethasone and Treatment may vary due to age or medical condition  Airway Management Planned: Oral ETT  Additional Equipment:   Intra-op Plan:   Post-operative Plan: Extubation in OR  Informed Consent: I have reviewed the patients History and Physical, chart, labs and discussed the procedure including the risks,  benefits and alternatives for the proposed anesthesia with the patient or authorized representative who has indicated his/her understanding and acceptance.     Dental advisory given  Plan Discussed with: CRNA  Anesthesia Plan Comments: (Plan Full PPE use Plan GETA D/W PT -WTP with same after Q&A D/w Pt and Dr. Elonda Husky proceeding at increased risk - WTP)        Anesthesia Quick Evaluation

## 2018-10-17 NOTE — Transfer of Care (Signed)
Immediate Anesthesia Transfer of Care Note  Patient: Donna Long  Procedure(s) Performed: HYSTERECTOMY VAGINAL (N/A Vagina )  Patient Location: PACU  Anesthesia Type:General  Level of Consciousness: awake, alert  and patient cooperative  Airway & Oxygen Therapy: Patient Spontanous Breathing and Patient connected to face mask oxygen  Post-op Assessment: Report given to RN and Post -op Vital signs reviewed and stable  Post vital signs: Reviewed and stable  Last Vitals:  Vitals Value Taken Time  BP 164/86 10/17/18 1211  Temp    Pulse 59 10/17/18 1217  Resp 15 10/17/18 1217  SpO2 100 % 10/17/18 1217  Vitals shown include unvalidated device data.  Last Pain:  Vitals:   10/17/18 0807  TempSrc: Oral  PainSc: 0-No pain      Patients Stated Pain Goal: 7 (49/44/96 7591)  Complications: No apparent anesthesia complications

## 2018-10-17 NOTE — Anesthesia Postprocedure Evaluation (Signed)
Anesthesia Post Note  Patient: Donna Long  Procedure(s) Performed: HYSTERECTOMY VAGINAL (N/A Vagina )  Patient location during evaluation: PACU Anesthesia Type: General Level of consciousness: awake Pain management: pain level controlled Vital Signs Assessment: post-procedure vital signs reviewed and stable Respiratory status: spontaneous breathing and non-rebreather facemask Cardiovascular status: stable Postop Assessment: no apparent nausea or vomiting Anesthetic complications: no     Last Vitals:  Vitals:   10/17/18 0807  BP: 140/77  Pulse: 73  Resp: 20  Temp: 36.7 C  SpO2: 99%    Last Pain:  Vitals:   10/17/18 0807  TempSrc: Oral  PainSc: 0-No pain                 Everette Rank

## 2018-10-17 NOTE — Anesthesia Procedure Notes (Signed)
Procedure Name: Intubation Date/Time: 10/17/2018 9:56 AM Performed by: Georgeanne Nim, CRNA Pre-anesthesia Checklist: Patient identified, Emergency Drugs available, Suction available, Patient being monitored and Timeout performed Patient Re-evaluated:Patient Re-evaluated prior to induction Oxygen Delivery Method: Circle system utilized Preoxygenation: Pre-oxygenation with 100% oxygen Induction Type: IV induction Ventilation: Mask ventilation without difficulty Laryngoscope Size: Mac and 4 Grade View: Grade I Tube type: Oral Number of attempts: 1 Airway Equipment and Method: Stylet (blankets for sniffing postion) Placement Confirmation: ETT inserted through vocal cords under direct vision,  positive ETCO2,  CO2 detector and breath sounds checked- equal and bilateral Secured at: 22 cm Tube secured with: Tape (tiny nick on tongue after removing airway prior to intubation) Dental Injury: Injury to tongue  Difficulty Due To: Difficulty was anticipated

## 2018-10-17 NOTE — Progress Notes (Signed)
Talked with Maudie Mercury in resp. Pt condition for CPAP discussed. Order for CPAP placed. RT will evaluate.

## 2018-10-17 NOTE — Op Note (Signed)
Preoperative diagnosis:  1.  DUB post ablation                                         2.  Dysmenorrhea                                         3.  Post ablation syndrome                                         4.  No hematometra  Postoperative diagnosis:  Same as above   Procedure:  Vaginal hysterectomy  Surgeon:  Florian Buff MD  Anesthesia:  General Endotracheal  Findings:  Spongy slightly enlarged uterus, ?adenomyosis    Description of operation:  The patient was taken from the preoperative area to the operating room in stable condition. She was placed in the supine position and underwent a GET anesthetic. Once an adequate level of anesthesia was attained she was placed in the dorsal lithotomy position. Patient was prepped and draped in the usual sterile fashion and a Foley catheter was placed.  A weighted speculum was placed and the cervix was grasped with thyroid tenaculums both anteriorly and posteriorly.  0.5% Marcaine plain was injected in a circumferential fashion about the cervix and the electrocautery unit was used to incise the vagina and push at all cervix.  The posterior cul-de-sac was then entered sharply without difficulty.  The uterosacral ligaments were clamped cut and inspection suture ligated and held.  The cardinal ligaments were then clamped cut transfixion suture ligated and cut. The anterior peritoneum was identified the anterior cul-de-sac was entered sharply without difficulty. The anterior and posterior leaves of the broad ligament were plicated and the uterine vessels were clamped cut and suture ligated. Serial pedicles were taken of the fundus with each pedicle being clamped cut and suture ligated. The utero-ovarian ligaments were crossclamped the uterus was removed and both pedicles were transfixion suture ligated. There was good hemostasis of all the pedicles. The peritoneum was then closed in a pursestring fashion using 3-0 Vicryl. The anterior posterior vagina was  closed in interrupted fashion with good resultant hemostasis. Our closure the lower pelvis and vagina were irrigated vigorously.  The sponge needle and instrument counts were correct x 3.  Total blood loss for the procedure was 150 cc.  The patient received 2 g of Ancef and 30 mg of Toradol IV preoperatively prophylactically.  She was taken to the recovery room in good stable condition awake alert doing well.  Florian Buff 10/17/2018, 12:11 PM

## 2018-10-17 NOTE — Anesthesia Postprocedure Evaluation (Signed)
Anesthesia Post Note  Patient: Donna Long  Procedure(s) Performed: HYSTERECTOMY VAGINAL (N/A Vagina )  Patient location during evaluation: PACU Anesthesia Type: General Level of consciousness: awake and alert Pain management: pain level controlled Vital Signs Assessment: post-procedure vital signs reviewed and stable Respiratory status: spontaneous breathing Cardiovascular status: stable Postop Assessment: no apparent nausea or vomiting Anesthetic complications: no     Last Vitals:  Vitals:   10/17/18 1300 10/17/18 1315  BP: (!) 152/92 (!) 158/90  Pulse: (!) 51 (!) 59  Resp: (!) 8 12  Temp:    SpO2: 100% 100%    Last Pain:  Vitals:   10/17/18 1300  TempSrc:   PainSc: 2                  Everette Rank

## 2018-10-17 NOTE — H&P (Signed)
Preoperative History and Physical  Donna Long is a 48 y.o. G4P4 with No LMP recorded. Patient has had an ablation. admitted for a TVH BSO.   Pt is s/p endometrial ablation and has had almost continuous pain since I think she has a post ablation syndrome, I have tried megestrol and low dose progesterone and pain pills all to no avail, her pain and bleeding continue Sonogram normal, no chronic hematometra present, pt continues to be in almost daily pain, will proceed with definitive surgical management  PMH:    Past Medical History:  Diagnosis Date  . Anemia    d/t gynecological issues  . Anxiety   . Arthritis   . Back injury   . Chronic back pain   . Depression   . Dysrhythmia    HX SVT  . Fatty liver   . GERD (gastroesophageal reflux disease)   . Hypertension   . SVT (supraventricular tachycardia) (HCC)     PSH:     Past Surgical History:  Procedure Laterality Date  . DILITATION & CURRETTAGE/HYSTROSCOPY WITH NOVASURE ABLATION N/A 08/16/2017   Procedure: DILATATION & CURETTAGE/HYSTEROSCOPY WITH MINERVA  ENDOMETRIAL ABLATION;  Surgeon: Lazaro ArmsEure,  H, MD;  Location: [facility];  Service: Gynecology;  Laterality: N/A;  . TUBAL LIGATION    . WRIST SURGERY Right     POb/GynH:      OB History    Gravida  4   Para  4   Term      Preterm      AB      Living  4     SAB      TAB      Ectopic      Multiple      Live Births              SH:   Social History   Tobacco Use  . Smoking status: Former Smoker    Packs/day: 1.00    Years: 33.00    Pack years: 33.00    Types: Cigarettes    Quit date: 04/02/2008    Years since quitting: 10.5  . Smokeless tobacco: Never Used  Substance Use Topics  . Alcohol use: No  . Drug use: No    FH:    Family History  Problem Relation Age of Onset  . Hypertension Mother   . Heart disease Mother   . COPD Father   . Hypertension Maternal Aunt   . Diabetes Maternal Grandmother   . Kidney disease Sister       Allergies:  Allergies  Allergen Reactions  . Clindamycin/Lincomycin Rash    Medications:       Current Facility-Administered Medications:  .  ceFAZolin (ANCEF) 3 g in dextrose 5 % 50 mL IVPB, 3 g, Intravenous, On Call to OR, Lazaro ArmsEure,  H, MD .  lactated ringers infusion, , Intravenous, Continuous, Lemont FillersNabonsal, Fara BorosJeff S, MD, Last Rate: 50 mL/hr at 10/17/18 [id number]  Review of Systems:   Review of Systems  Constitutional: Negative for fever, chills, weight loss, malaise/fatigue and diaphoresis.  HENT: Negative for hearing loss, ear pain, nosebleeds, congestion, sore throat, neck pain, tinnitus and ear discharge.   Eyes: Negative for blurred vision, double vision, photophobia, pain, discharge and redness.  Respiratory: Negative for cough, hemoptysis, sputum production, shortness of breath, wheezing and stridor.   Cardiovascular: Negative for chest pain, palpitations, orthopnea, claudication, leg swelling and PND.  Gastrointestinal: Positive for abdominal pain. Negative for heartburn, nausea, vomiting, diarrhea, constipation, blood in stool  and melena.  Genitourinary: Negative for dysuria, urgency, frequency, hematuria and flank pain.  Musculoskeletal: Negative for myalgias, back pain, joint pain and falls.  Skin: Negative for itching and rash.  Neurological: Negative for dizziness, tingling, tremors, sensory change, speech change, focal weakness, seizures, loss of consciousness, weakness and headaches.  Endo/Heme/Allergies: Negative for environmental allergies and polydipsia. Does not bruise/bleed easily.  Psychiatric/Behavioral: Negative for depression, suicidal ideas, hallucinations, memory loss and substance abuse. The patient is not nervous/anxious and does not have insomnia.      PHYSICAL EXAM:  Blood pressure 140/77, pulse 73, temperature 98.1 F (36.7 C), temperature source Oral, resp. rate 20, SpO2 99 %.    Vitals reviewed. Constitutional: She is oriented to person, place,  and time. She appears well-developed and well-nourished.  HENT:  Head: Normocephalic and atraumatic.  Right Ear: External ear normal.  Left Ear: External ear normal.  Nose: Nose normal.  Mouth/Throat: Oropharynx is clear and moist.  Eyes: Conjunctivae and EOM are normal. Pupils are equal, round, and reactive to light. Right eye exhibits no discharge. Left eye exhibits no discharge. No scleral icterus.  Neck: Normal range of motion. Neck supple. No tracheal deviation present. No thyromegaly present.  Cardiovascular: Normal rate, regular rhythm, normal heart sounds and intact distal pulses.  Exam reveals no gallop and no friction rub.   No murmur heard. Respiratory: Effort normal and breath sounds normal. No respiratory distress. She has no wheezes. She has no rales. She exhibits no tenderness.  GI: Soft. Bowel sounds are normal. She exhibits no distension and no mass. There is tenderness. There is no rebound and no guarding.  Genitourinary:       Vulva is normal without lesions Vagina is pink moist without discharge Cervix normal in appearance and pap is normal Uterus is normal size, contour, position, consistency, mobility, non-tender Adnexa is negative with normal sized ovaries by sonogram  Musculoskeletal: Normal range of motion. She exhibits no edema and no tenderness.  Neurological: She is alert and oriented to person, place, and time. She has normal reflexes. She displays normal reflexes. No cranial nerve deficit. She exhibits normal muscle tone. Coordination normal.  Skin: Skin is warm and dry. No rash noted. No erythema. No pallor.  Psychiatric: She has a normal mood and affect. Her behavior is normal. Judgment and thought content normal.    Labs: Results for orders placed or performed during the hospital encounter of 10/16/18 (from the past 336 hour(s))  SARS CORONAVIRUS 2 (TAT 6-24 HRS) Nasopharyngeal Nasopharyngeal Swab   Collection Time: 10/16/18  7:25 AM   Specimen:  Nasopharyngeal Swab  Result Value Ref Range   SARS Coronavirus 2 NEGATIVE NEGATIVE  Results for orders placed or performed during the hospital encounter of 10/12/18 (from the past 336 hour(s))  Urinalysis, Routine w reflex microscopic   Collection Time: 10/12/18  9:01 AM  Result Value Ref Range   Color, Urine YELLOW YELLOW   APPearance CLEAR CLEAR   Specific Gravity, Urine 1.008 1.005 - 1.030   pH 6.0 5.0 - 8.0   Glucose, UA NEGATIVE NEGATIVE mg/dL   Hgb urine dipstick SMALL (A) NEGATIVE   Bilirubin Urine NEGATIVE NEGATIVE   Ketones, ur NEGATIVE NEGATIVE mg/dL   Protein, ur NEGATIVE NEGATIVE mg/dL   Nitrite NEGATIVE NEGATIVE   Leukocytes,Ua NEGATIVE NEGATIVE   RBC / HPF 0-5 0 - 5 RBC/hpf   WBC, UA 0-5 0 - 5 WBC/hpf   Bacteria, UA NONE SEEN NONE SEEN   Squamous Epithelial / LPF 0-5  0 - 5   Mucus PRESENT   CBC   Collection Time: 10/12/18  9:04 AM  Result Value Ref Range   WBC 9.0 4.0 - 10.5 K/uL   RBC 4.97 3.87 - 5.11 MIL/uL   Hemoglobin 11.6 (L) 12.0 - 15.0 g/dL   HCT 16.139.1 09.636.0 - 04.546.0 %   MCV 78.7 (L) 80.0 - 100.0 fL   MCH 23.3 (L) 26.0 - 34.0 pg   MCHC 29.7 (L) 30.0 - 36.0 g/dL   RDW 40.917.1 (H) 81.111.5 - 91.415.5 %   Platelets 372 150 - 400 K/uL   nRBC 0.0 0.0 - 0.2 %  Comprehensive metabolic panel   Collection Time: 10/12/18  9:04 AM  Result Value Ref Range   Sodium 140 135 - 145 mmol/L   Potassium 3.6 3.5 - 5.1 mmol/L   Chloride 110 98 - 111 mmol/L   CO2 21 (L) 22 - 32 mmol/L   Glucose, Bld 82 70 - 99 mg/dL   BUN 12 6 - 20 mg/dL   Creatinine, Ser 7.820.69 0.44 - 1.00 mg/dL   Calcium 8.9 8.9 - 95.610.3 mg/dL   Total Protein 7.2 6.5 - 8.1 g/dL   Albumin 3.9 3.5 - 5.0 g/dL   AST 18 15 - 41 U/L   ALT 21 0 - 44 U/L   Alkaline Phosphatase 86 38 - 126 U/L   Total Bilirubin 0.3 0.3 - 1.2 mg/dL   GFR calc non Af Amer >60 >60 mL/min   GFR calc Af Amer >60 >60 mL/min   Anion gap 9 5 - 15  hCG, quantitative, pregnancy   Collection Time: 10/12/18  9:04 AM  Result Value Ref Range   hCG,  Beta Chain, Quant, S 1 <5 mIU/mL  Rapid HIV screen (HIV 1/2 Ab+Ag)   Collection Time: 10/12/18  9:04 AM  Result Value Ref Range   HIV-1 P24 Antigen - HIV24 NON REACTIVE NON REACTIVE   HIV 1/2 Antibodies NON REACTIVE NON REACTIVE   Interpretation (HIV Ag Ab)      A non reactive test result means that HIV 1 or HIV 2 antibodies and HIV 1 p24 antigen were not detected in the specimen.  Type and screen   Collection Time: 10/12/18  9:04 AM  Result Value Ref Range   ABO/RH(D) A POS    Antibody Screen NEG    Sample Expiration      10/25/2018,2359 Performed at [facility], [address] [place], [place] [id number]     EKG: Orders placed or performed during the hospital encounter of 10/12/18  . EKG 12-Lead  . EKG 12-Lead    Imaging Studies: GYNECOLOGIC SONOGRAM   Donna Long is a 48 y.o. G4P4 ,she is here for a pelvic sonogram for post ablation hematometra with LLQ pain.  Uterus                      9.8 x 5 x 6.4 cm, Total uterine volume 164 cc,homogeneous anteverted uterus,wnl  Endometrium          9.8 mm, symmetrical, wnl  Right ovary             2.3 x 1.4 x 2 cm, wnl  Left ovary                2 x 1.7 x 2.5 cm, wnl  No free fluid   Technician Comments:  PELVIC US TA/TV:homogeneous anteverted uterus,wnl,mult simple small nabothian cysts,EEC 9.8 mm,normal ovaries bilat,ovaries appear mobile,no free fluid,some  discomfort during ultrasound    [employer] 07/05/2018 2:12 PM  Clinical Impression and recommendations:  I have reviewed the sonogram results above, combined with the patient's current clinical course, below are my impressions and any appropriate recommendations for management based on the sonographic findings.  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  The patient also understands the alternative treatment options which were discussed in full.  All questions were answered.  Mertie Clause  10/17/2018 9:05 AM   Florian Buff 10/17/2018 9:04 AM

## 2018-10-18 ENCOUNTER — Encounter (HOSPITAL_COMMUNITY): Payer: Self-pay | Admitting: Obstetrics & Gynecology

## 2018-10-18 LAB — BASIC METABOLIC PANEL
Anion gap: 6 (ref 5–15)
BUN: 10 mg/dL (ref 6–20)
CO2: 23 mmol/L (ref 22–32)
Calcium: 8.7 mg/dL — ABNORMAL LOW (ref 8.9–10.3)
Chloride: 110 mmol/L (ref 98–111)
Creatinine, Ser: 0.66 mg/dL (ref 0.44–1.00)
GFR calc Af Amer: >60 mL/min (ref >60)
GFR calc non Af Amer: >60 mL/min (ref >60)
Glucose, Bld: 121 mg/dL — ABNORMAL HIGH (ref 70–99)
Potassium: 4.1 mmol/L (ref 3.5–5.1)
Sodium: 139 mmol/L (ref 135–145)

## 2018-10-18 LAB — CBC
HCT: 36.1 % (ref 36.0–46.0)
Hemoglobin: 10.6 g/dL — ABNORMAL LOW (ref 12.0–15.0)
MCH: 23.2 pg — ABNORMAL LOW (ref 26.0–34.0)
MCHC: 29.4 g/dL — ABNORMAL LOW (ref 30.0–36.0)
MCV: 79.2 fL — ABNORMAL LOW (ref 80.0–100.0)
Platelets: 421 10*3/uL — ABNORMAL HIGH (ref 150–400)
RBC: 4.56 MIL/uL (ref 3.87–5.11)
RDW: 17 % — ABNORMAL HIGH (ref 11.5–15.5)
WBC: 15 10*3/uL — ABNORMAL HIGH (ref 4.0–10.5)
nRBC: 0 % (ref 0.0–0.2)

## 2018-10-18 MED ORDER — CIPROFLOXACIN HCL 500 MG PO TABS: 500.0000 mg | ORAL_TABLET | Freq: Two times a day (BID) | ORAL | 0 refills | Status: DC

## 2018-10-18 MED ORDER — SODIUM CHLORIDE 0.9 % IV SOLN
510.0000 mg | Freq: Once | INTRAVENOUS | Status: AC
  Administered 2018-10-18: 510 mg via INTRAVENOUS
  Filled 2018-10-18: qty 510

## 2018-10-18 MED ORDER — OXYCODONE HCL 5 MG PO TABS: 5.0000 mg | ORAL_TABLET | ORAL | 0 refills | Status: DC | PRN

## 2018-10-18 MED ORDER — KETOROLAC TROMETHAMINE 10 MG PO TABS: 10.0000 mg | ORAL_TABLET | Freq: Three times a day (TID) | ORAL | 0 refills | Status: DC | PRN

## 2018-10-18 MED ORDER — ONDANSETRON HCL 8 MG PO TABS: 8.0000 mg | ORAL_TABLET | Freq: Four times a day (QID) | ORAL | 0 refills | Status: DC | PRN

## 2018-10-18 NOTE — Addendum Note (Signed)
Addendum  created 10/18/18 0819 by Mickel Baas, CRNA   Charge Capture section accepted

## 2018-10-18 NOTE — Plan of Care (Signed)

## 2018-10-18 NOTE — Discharge Instructions (Signed)
Vaginal Hysterectomy, Care After °Refer to this sheet in the next few weeks. These instructions provide you with information about caring for yourself after your procedure. Your health care provider may also give you more specific instructions. Your treatment has been planned according to current medical practices, but problems sometimes occur. Call your health care provider if you have any problems or questions after your procedure. °What can I expect after the procedure? °After the procedure, it is common to have: °· Pain. °· Soreness and numbness in your incision areas. °· Vaginal bleeding and discharge. °· Constipation. °· Temporary problems emptying the bladder. °· Feelings of sadness or other emotions. °Follow these instructions at home: °Medicines °· Take over-the-counter and prescription medicines only as told by your health care provider. °· If you were prescribed an antibiotic medicine, take it as told by your health care provider. Do not stop taking the antibiotic even if you start to feel better. °· Do not drive or operate heavy machinery while taking prescription pain medicine. °Activity °· Return to your normal activities as told by your health care provider. Ask your health care provider what activities are safe for you. °· Get regular exercise as told by your health care provider. You may be told to take short walks every day and go farther each time. °· Do not lift anything that is heavier than 10 lb (4.5 kg). °General instructions ° °· Do not put anything in your vagina for 6 weeks after your surgery or as told by your health care provider. This includes tampons and douches. °· Do not have sex until your health care provider says you can. °· Do not take baths, swim, or use a hot tub until your health care provider approves. °· Drink enough fluid to keep your urine clear or pale yellow. °· Do not drive for 24 hours if you were given a sedative. °· Keep all follow-up visits as told by your health  care provider. This is important. °Contact a health care provider if: °· Your pain medicine is not helping. °· You have a fever. °· You have redness, swelling, or pain at your incision site. °· You have blood, pus, or a bad-smelling discharge from your vagina. °· You continue to have difficulty urinating. °Get help right away if: °· You have severe abdominal or back pain. °· You have heavy bleeding from your vagina. °· You have chest pain or shortness of breath. °This information is not intended to replace advice given to you by your health care provider. Make sure you discuss any questions you have with your health care provider. °Document Released: 05/18/2015 Document Revised: 09/17/2015 Document Reviewed: 02/08/2015 °Elsevier Patient Education © 2020 [employer] ° °

## 2018-10-18 NOTE — Plan of Care (Signed)
  Problem: Education: Goal: Knowledge of General Education information will improve Description: Including pain rating scale, medication(s)/side effects and non-pharmacologic comfort measures Outcome: Adequate for Discharge   Problem: Clinical Measurements: Goal: Will remain free from infection Outcome: Adequate for Discharge   Problem: Pain Managment: Goal: General experience of comfort will improve Outcome: Adequate for Discharge   Problem: Safety: Goal: Ability to remain free from injury will improve Outcome: Adequate for Discharge

## 2018-10-18 NOTE — Discharge Summary (Signed)
Physician Discharge Summary  Patient ID: Donna Long MRN: [id number] DOB/AGE: [birth year] 48 y.o.  Admit date: 10/17/2018 Discharge date: 10/18/2018  Admission Diagnoses: S/p [facility]  Discharge Diagnoses:  Active Problems:   S/P vaginal hysterectomy   Discharged Condition: good  Hospital Course: normal post op course  Consults: None  Significant Diagnostic Studies: labs:  Results for orders placed or performed during the hospital encounter of 10/17/18 (from the past 24 hour(s))  CBC   Collection Time: 10/18/18  6:36 AM  Result Value Ref Range   WBC 15.0 (H) 4.0 - 10.5 K/uL   RBC 4.56 3.87 - 5.11 MIL/uL   Hemoglobin 10.6 (L) 12.0 - 15.0 g/dL   HCT 36.1 36.0 - 46.0 %   MCV 79.2 (L) 80.0 - 100.0 fL   MCH 23.2 (L) 26.0 - 34.0 pg   MCHC 29.4 (L) 30.0 - 36.0 g/dL   RDW 17.0 (H) 11.5 - 15.5 %   Platelets 421 (H) 150 - 400 K/uL   nRBC 0.0 0.0 - 0.2 %  Basic metabolic panel   Collection Time: 10/18/18  6:36 AM  Result Value Ref Range   Sodium 139 135 - 145 mmol/L   Potassium 4.1 3.5 - 5.1 mmol/L   Chloride 110 98 - 111 mmol/L   CO2 23 22 - 32 mmol/L   Glucose, Bld 121 (H) 70 - 99 mg/dL   BUN 10 6 - 20 mg/dL   Creatinine, Ser 0.66 0.44 - 1.00 mg/dL   Calcium 8.7 (L) 8.9 - 10.3 mg/dL   GFR calc non Af Amer >60 >60 mL/min   GFR calc Af Amer >60 >60 mL/min   Anion gap 6 5 - 15     Treatments: surgery: TVH  Discharge Exam: Blood pressure 116/62, pulse 63, temperature 98.5 F (36.9 C), temperature source Oral, resp. rate 18, height 5' 6.5" (1.689 m), weight 131.5 kg, SpO2 100 %. General appearance: alert, cooperative and no distress GI: soft, non-tender; bowel sounds normal; no masses,  no organomegaly  Disposition: Discharge disposition: 01-Home or Self Care       Discharge Instructions    Call MD for:  persistant nausea and vomiting   Complete by: As directed    Call MD for:  severe uncontrolled pain   Complete by: As directed    Call MD for:  temperature >100.4    Complete by: As directed    Diet - low sodium heart healthy   Complete by: As directed    Driving Restrictions   Complete by: As directed    driving in 1 week is ok   Increase activity slowly   Complete by: As directed    Lifting restrictions   Complete by: As directed    No lifting at all   No wound care   Complete by: As directed    Sexual Activity Restrictions   Complete by: As directed    No sex at all        Signed: Florian Buff 10/18/2018, 12:39 PM

## 2018-10-22 ENCOUNTER — Encounter: Payer: Self-pay | Admitting: Obstetrics and Gynecology

## 2018-10-25 NOTE — Progress Notes (Signed)
Cardiology Office Note     Virtual Visit via Telephone Note   This visit type was conducted due to national recommendations for restrictions regarding the COVID-19 Pandemic (e.g. social distancing) in an effort to limit this patient's exposure and mitigate transmission in our community.  Due to her co-morbid illnesses, this patient is at least at moderate risk for complications without adequate follow up.  This format is felt to be most appropriate for this patient at this time.  The patient did not have access to video technology/had technical difficulties with video requiring transitioning to audio format only (telephone).  All issues noted in this document were discussed and addressed.  No physical exam could be performed with this format.  Please refer to the patient's chart for her  consent to telehealth for [facility].   Date:  10/26/2018   ID:  Donna Long, DOB [birth year], MRN [id number]  Patient Location: Home Provider Location: Office    Date:  10/26/2018   ID:  Donna Long, DOB 13-Mar-1970, MRN [id number]  PCP:  [facility]  Cardiologist:  Dr Eden Emms    Chief Complaint  Patient presents with  . Hospitalization Follow-up      History of Present Illness: Donna Long is a 48 y.o. female who presents for post hospital after hysterectomy but anesthesiologist told her she had significant sleep apnea and needed cpap.   She has a history of SVT and chest pain Referred by Jacquelin Hawking PA-C She indicates cutting out caffeine. Can get SSCP with palpitations TSH normal on 10/14/16 She has been bothered by palpitations over last 3-4 years Describes multiple ER visits for SVT and getting Adenosine has not seen EP or discussed ablation. She was on beta blocker in past but said it made her BP too low Moved here to be near sister and mom. No chest pain dyspnea or syncope. Getting daily palpitations that can last 5-15 minutes  hx of svt treated with meds   Hospitalized 07/01/18 with sharp chest pain and nuc study was neg for ischemia.   She was on metoprolol then but never followed up with Cardiology.  With Vag hysterectomy 10/17/18  Mention of cpap post procedure per anesthesiologist.  She reports more SVT freq episodes and headaches.  She ran out of her metoprolol.  She was unable to take BP today, we will arrange for her to have an auto BP cuff.  BP at discharge was 116/62         Past Medical History:  Diagnosis Date  . Anemia    d/t gynecological issues  . Anxiety   . Arthritis   . Back injury   . Chronic back pain   . Depression   . Dysrhythmia    HX SVT  . Fatty liver   . GERD (gastroesophageal reflux disease)   . Hypertension   . SVT (supraventricular tachycardia) (HCC)     Past Surgical History:  Procedure Laterality Date  . DILITATION & CURRETTAGE/HYSTROSCOPY WITH NOVASURE ABLATION N/A 08/16/2017   Procedure: DILATATION & CURETTAGE/HYSTEROSCOPY WITH MINERVA  ENDOMETRIAL ABLATION;  Surgeon: Lazaro Arms, MD;  Location: [facility];  Service: Gynecology;  Laterality: N/A;  . TUBAL LIGATION    . VAGINAL HYSTERECTOMY N/A 10/17/2018   Procedure: HYSTERECTOMY VAGINAL;  Surgeon: Lazaro Arms, MD;  Location: [facility];  Service: Gynecology;  Laterality: N/A;  . WRIST SURGERY Right      Current Outpatient Medications  Medication Sig Dispense Refill  . aspirin 81  MG chewable tablet Chew 162 mg by mouth daily.    . Multiple Vitamins-Minerals (ALIVE WOMENS GUMMY PO) Take 2 each by mouth daily. Chewables    . ondansetron (ZOFRAN) 8 MG tablet Take 1 tablet (8 mg total) by mouth every 6 (six) hours as needed for nausea. 20 tablet 0   No current facility-administered medications for this visit.     Allergies:   Clindamycin/lincomycin    Social History:  The patient  reports that she quit smoking about 10 years ago. Her smoking use included cigarettes. She has a 33.00 pack-year smoking history. She has never used smokeless tobacco. She  reports that she does not drink alcohol or use drugs.   Family History:  The patient's family history includes COPD in her father; Diabetes in her maternal grandmother; Heart disease in her mother; Hypertension in her maternal aunt and mother; Kidney disease in her sister.    ROS:  General:no colds or fevers, no weight changes Skin:no rashes or ulcers HEENT:no blurred vision, no congestion CV:see HPI PUL:see HPI GI:no diarrhea constipation or melena, no indigestion GU:no hematuria, no dysuria MS:no joint pain, no claudication Neuro:no syncope, no lightheadedness Endo:no diabetes, no thyroid disease  Wt Readings from Last 3 Encounters:  10/26/18 290 lb (131.5 kg)  10/17/18 289 lb 15.7 oz (131.5 kg)  10/12/18 290 lb (131.5 kg)     PHYSICAL EXAM: VS:  Ht 5' 6.5" (1.689 m)   Wt 290 lb (131.5 kg)   BMI 46.11 kg/m  , BMI Body mass index is 46.11 kg/m. General:Pleasant affect, NAD Lungs:no SOB with talking Neuro:alert and oriented X 3,     EKG:  EKG is NOT ordered today. The ekg in the hospital 10/12/18 with SR and no ST changes   Recent Labs: 10/12/2018: ALT 21 10/18/2018: BUN 10; Creatinine, Ser 0.66; Hemoglobin 10.6; Platelets 421; Potassium 4.1; Sodium 139    Lipid Panel    Component Value Date/Time   CHOL 146 06/30/2018 0547   TRIG 181 (H) 06/30/2018 0547   HDL 38 (L) 06/30/2018 0547   CHOLHDL 3.8 06/30/2018 0547   VLDL 36 06/30/2018 0547   LDLCALC 72 06/30/2018 0547       Other studies Reviewed: Additional studies/ records that were reviewed today include:. Echo 06/30/18 IMPRESSIONS    1. The left ventricle has normal systolic function with an ejection fraction of 60-65%. The cavity size was normal. Left ventricular diastolic Doppler parameters are consistent with pseudonormalization.  2. The right ventricle has normal systolic function. The cavity was normal. There is no increase in right ventricular wall thickness.  3. The mitral valve is grossly normal.   4. The aortic valve is grossly normal.  5. The interatrial septum was not assessed.  FINDINGS  Left Ventricle: The left ventricle has normal systolic function, with an ejection fraction of 60-65%. The cavity size was normal. There is no increase in left ventricular wall thickness. Left ventricular diastolic Doppler parameters are consistent with  pseudonormalization.  Right Ventricle: The right ventricle has normal systolic function. The cavity was normal. There is no increase in right ventricular wall thickness.  Left Atrium: Left atrial size was normal in size.  Right Atrium: Right atrial size was normal in size. Right atrial pressure is estimated at 3 mmHg.  Interatrial Septum: The interatrial septum was not assessed.  Pericardium: There is no evidence of pericardial effusion.  Mitral Valve: The mitral valve is grossly normal. Mitral valve regurgitation is not visualized by color flow Doppler.  Tricuspid Valve: The tricuspid valve is normal in structure. Tricuspid valve regurgitation was not visualized by color flow Doppler.  Aortic Valve: The aortic valve is grossly normal Aortic valve regurgitation was not visualized by color flow Doppler.  Pulmonic Valve: The pulmonic valve was grossly normal. Pulmonic valve regurgitation is not visualized by color flow Doppler.  NUC study 06/30/18  There was no ST segment deviation noted during stress.  T wave inversion was noted during stress in the II, III, aVF, V4, V5 and V6 leads, beginning at 1 minutes of stress, ending at 2 minutes of stress.  The study is normal.  This is a low risk study.  Nuclear stress EF: 59%.   Low risk stress nuclear study with normal perfusion and normal left ventricular regional and global systolic function.  ASSESSMENT AND PLAN:  1.  SVT more freq episodes she would like to proceed with ablation.  Will resume toprol XL but at 25 mg due to lower BP.  Will make appt ASAP with Dr. Lovena Le in [place]  or [place].   2.  Per anesthesiologist needs sleep apnea eval with apnea with anesthesia and she has freq episodes of rapid HR and headaches.  Will order sleep study either at sleep center or home study. Depending on insurance.     spent 8 min on phone.   Current medicines are reviewed with the patient today.  The patient Has no concerns regarding medicines.  The following changes have been made:  See above Labs/ tests ordered today include:see above  Disposition:   FU:  see above  Signed, Cecilie Kicks, NP  10/26/2018 1:19 PM    [facility] [address], [address] [place], [place] Phone: [phone]; Fax: [phone]

## 2018-10-26 ENCOUNTER — Other Ambulatory Visit: Payer: Self-pay

## 2018-10-26 ENCOUNTER — Telehealth (INDEPENDENT_AMBULATORY_CARE_PROVIDER_SITE_OTHER): Payer: Self-pay | Admitting: Cardiology

## 2018-10-26 ENCOUNTER — Encounter: Payer: Self-pay | Admitting: Cardiology

## 2018-10-26 VITALS — Ht 66.5 in | Wt 290.0 lb

## 2018-10-26 DIAGNOSIS — G4730 Sleep apnea, unspecified: Secondary | ICD-10-CM

## 2018-10-26 DIAGNOSIS — I471 Supraventricular tachycardia: Secondary | ICD-10-CM

## 2018-10-26 MED ORDER — METOPROLOL SUCCINATE ER 25 MG PO TB24: 25.0000 mg | ORAL_TABLET | Freq: Every day | ORAL | 3 refills | Status: DC

## 2018-10-26 NOTE — Patient Instructions (Signed)
Medication Instructions:  Your physician recommends that you continue on your current medications as directed. Please refer to the Current Medication list given to you today.  Start Toprol XL 25 mg Daily   If you need a refill on your cardiac medications before your next appointment, please call your pharmacy.   Lab work: NONE  If you have labs (blood work) drawn today and your tests are completely normal, you will receive your results only by: Marland Kitchen MyChart Message (if you have MyChart) OR . A paper copy in the mail If you have any lab test that is abnormal or we need to change your treatment, we will call you to review the results.  Testing/Procedures: Your physician has recommended that you have a sleep study. This test records several body functions during sleep, including: brain activity, eye movement, oxygen and carbon dioxide blood levels, heart rate and rhythm, breathing rate and rhythm, the flow of air through your mouth and nose, snoring, body muscle movements, and chest and belly movement.  Follow-Up: At [facility], you and your health needs are our priority.  As part of our continuing mission to provide you with exceptional heart care, we have created designated Provider Care Teams.  These Care Teams include your primary Cardiologist (physician) and Advanced Practice Providers (APPs -  Physician Assistants and Nurse Practitioners) who all work together to provide you with the care you need, when you need it. You will need a follow up appointment . Please call our office 2 months in advance to schedule this appointment.  You may see Jenkins Rouge, MD or one of the following Advanced Practice Providers on your designated Care Team:   Bernerd Pho, PA-C [facility]) . Ermalinda Barrios, PA-C ([place])  Any Other Special Instructions Will Be Listed Below (If Applicable). Your physician recommends that you schedule a follow-up appointment with Dr. Radford Pax and with Dr.  Lovena Le.   Thank you for choosing [facility]!

## 2018-10-26 NOTE — Addendum Note (Signed)
Addended by: Levonne Hubert on: 10/26/2018 04:23 PM   Modules accepted: Orders

## 2018-10-29 ENCOUNTER — Encounter: Payer: Self-pay | Admitting: Obstetrics & Gynecology

## 2018-10-29 ENCOUNTER — Telehealth (HOSPITAL_COMMUNITY): Payer: Self-pay | Admitting: Surgery

## 2018-10-29 ENCOUNTER — Ambulatory Visit (INDEPENDENT_AMBULATORY_CARE_PROVIDER_SITE_OTHER): Payer: Self-pay | Admitting: Obstetrics & Gynecology

## 2018-10-29 ENCOUNTER — Other Ambulatory Visit: Payer: Self-pay

## 2018-10-29 VITALS — BP 137/88 | HR 69 | Ht 66.2 in | Wt 290.0 lb

## 2018-10-29 DIAGNOSIS — Z90710 Acquired absence of both cervix and uterus: Secondary | ICD-10-CM

## 2018-10-29 DIAGNOSIS — Z9889 Other specified postprocedural states: Secondary | ICD-10-CM

## 2018-10-29 NOTE — Telephone Encounter (Signed)
Patient called to verify address and inform that scale and BP cuff will be provided through [facility] [facility].  Message left for patient.

## 2018-10-29 NOTE — Progress Notes (Signed)
  HPI: Patient returns for routine postoperative follow-up having undergone vaginal hysterectomy on 10/17/2018.  The patient's immediate postoperative recovery has been unremarkable. Since hospital discharge the patient reports no problems.   Current Outpatient Medications: aspirin 81 MG chewable tablet, Chew 162 mg by mouth daily., Disp: , Rfl:  metoprolol succinate (TOPROL XL) 25 MG 24 hr tablet, Take 1 tablet (25 mg total) by mouth daily., Disp: 90 tablet, Rfl: 3 Multiple Vitamins-Minerals (ALIVE WOMENS GUMMY PO), Take 2 each by mouth daily. Chewables, Disp: , Rfl:  ondansetron (ZOFRAN) 8 MG tablet, Take 1 tablet (8 mg total) by mouth every 6 (six) hours as needed for nausea. (Patient not taking: Reported on 10/29/2018), Disp: 20 tablet, Rfl: 0  No current facility-administered medications for this visit.     Blood pressure 137/88, pulse 69, height 5' 6.2" (1.681 m), weight 290 lb (131.5 kg), last menstrual period 07/05/2017.  Physical Exam: Normal vaginal exam  Diagnostic Tests:   Pathology: benign  Impression: S/p vag hyst uncomplicated post op course  Plan:   Follow up: 5  weeks  Florian Buff, MD

## 2018-10-30 ENCOUNTER — Telehealth: Payer: Self-pay | Admitting: Licensed Clinical Social Worker

## 2018-10-30 NOTE — Telephone Encounter (Signed)
CSW referred to assist patient with obtaining a scale and a BP cuff. CSW contacted patient to inform cuff and scale will be delivered to home. Patient grateful for support and assistance. CSW available as needed. Jackie Rossanna Spitzley, LCSW, CCSW-MCS [phone]  

## 2018-11-13 ENCOUNTER — Other Ambulatory Visit (HOSPITAL_BASED_OUTPATIENT_CLINIC_OR_DEPARTMENT_OTHER): Payer: Self-pay

## 2018-11-13 ENCOUNTER — Other Ambulatory Visit: Payer: Self-pay | Admitting: *Deleted

## 2018-11-13 ENCOUNTER — Ambulatory Visit (INDEPENDENT_AMBULATORY_CARE_PROVIDER_SITE_OTHER): Payer: Self-pay | Admitting: Internal Medicine

## 2018-11-13 ENCOUNTER — Other Ambulatory Visit (HOSPITAL_COMMUNITY)
Admission: RE | Admit: 2018-11-13 | Discharge: 2018-11-13 | Disposition: A | Discharge status: Home / Self Care | Payer: Self-pay | Source: Ambulatory Visit | Attending: Internal Medicine | Admitting: Internal Medicine

## 2018-11-13 ENCOUNTER — Encounter: Payer: Self-pay | Admitting: *Deleted

## 2018-11-13 ENCOUNTER — Encounter: Payer: Self-pay | Admitting: Internal Medicine

## 2018-11-13 ENCOUNTER — Other Ambulatory Visit: Payer: Self-pay

## 2018-11-13 VITALS — BP 126/80 | HR 65 | Temp 96.6°F | Ht 66.5 in | Wt 293.0 lb

## 2018-11-13 DIAGNOSIS — Z20828 Contact with and (suspected) exposure to other viral communicable diseases: Secondary | ICD-10-CM | POA: Insufficient documentation

## 2018-11-13 DIAGNOSIS — I471 Supraventricular tachycardia: Secondary | ICD-10-CM

## 2018-11-13 DIAGNOSIS — Z01812 Encounter for preprocedural laboratory examination: Secondary | ICD-10-CM | POA: Insufficient documentation

## 2018-11-13 LAB — CBC WITH DIFFERENTIAL/PLATELET
Abs Immature Granulocytes: 0.02 10*3/uL (ref 0.00–0.07)
Basophils Absolute: 0.1 10*3/uL (ref 0.0–0.1)
Basophils Relative: 1 %
Eosinophils Absolute: 0.2 10*3/uL (ref 0.0–0.5)
Eosinophils Relative: 3 %
HCT: 44.5 % (ref 36.0–46.0)
Hemoglobin: 13.1 g/dL (ref 12.0–15.0)
Immature Granulocytes: 0 %
Lymphocytes Relative: 32 %
Lymphs Abs: 2.5 10*3/uL (ref 0.7–4.0)
MCH: 24.3 pg — ABNORMAL LOW (ref 26.0–34.0)
MCHC: 29.4 g/dL — ABNORMAL LOW (ref 30.0–36.0)
MCV: 82.4 fL (ref 80.0–100.0)
Monocytes Absolute: 0.4 10*3/uL (ref 0.1–1.0)
Monocytes Relative: 5 %
Neutro Abs: 4.6 10*3/uL (ref 1.7–7.7)
Neutrophils Relative %: 59 %
Platelets: 329 10*3/uL (ref 150–400)
RBC: 5.4 MIL/uL — ABNORMAL HIGH (ref 3.87–5.11)
RDW: 22.4 % — ABNORMAL HIGH (ref 11.5–15.5)
WBC: 7.9 10*3/uL (ref 4.0–10.5)
nRBC: 0 % (ref 0.0–0.2)

## 2018-11-13 LAB — BASIC METABOLIC PANEL
Anion gap: 8 (ref 5–15)
BUN: 11 mg/dL (ref 6–20)
CO2: 26 mmol/L (ref 22–32)
Calcium: 9.3 mg/dL (ref 8.9–10.3)
Chloride: 107 mmol/L (ref 98–111)
Creatinine, Ser: 0.67 mg/dL (ref 0.44–1.00)
GFR calc Af Amer: >60 mL/min (ref >60)
GFR calc non Af Amer: >60 mL/min (ref >60)
Glucose, Bld: 109 mg/dL — ABNORMAL HIGH (ref 70–99)
Potassium: 4 mmol/L (ref 3.5–5.1)
Sodium: 141 mmol/L (ref 135–145)

## 2018-11-13 LAB — SARS CORONAVIRUS 2 (TAT 6-24 HRS): SARS Coronavirus 2: NEGATIVE

## 2018-11-13 NOTE — Progress Notes (Signed)
HPI [name] is referred today by Dr. Johnsie Cancel for evaluation of SVT. She is an obese 48 year old woman with a history of hypertension and chronic back pain, remote tobacco abuse, who stopped smoking 10 years ago. The patient developed palpitations at age 28 although she initially did not seek medical attention. As a young adult, she would have one or 2 episodes a year. Over the last 3-4 years, her episodes have increased in frequency and severity resulting in multiple emergency room visits. The patient states that her episodes start and stop suddenly and are associated with shortness of breath and chest pressure. She has not had syncope but notes near syncope. Intravenous adenosine terminates the SVT when the episodes do not stop on her own. She has been placed on medical therapy with beta blockers, but has developed symptomatic bradycardia. She feels fatigued and tired on her medications. Allergies  Allergen Reactions  . Clindamycin/Lincomycin Rash     Current Outpatient Medications  Medication Sig Dispense Refill  . aspirin 81 MG chewable tablet Chew 162 mg by mouth daily.    . metoprolol succinate (TOPROL XL) 25 MG 24 hr tablet Take 1 tablet (25 mg total) by mouth daily. 90 tablet 3  . Multiple Vitamins-Minerals (ALIVE WOMENS GUMMY PO) Take 2 each by mouth daily. Chewables     No current facility-administered medications for this visit.      Past Medical History:  Diagnosis Date  . Anemia    d/t gynecological issues  . Anxiety   . Arthritis   . Back injury   . Chronic back pain   . Depression   . Dysrhythmia    HX SVT  . Fatty liver   . GERD (gastroesophageal reflux disease)   . Hypertension   . SVT (supraventricular tachycardia) (HCC)     ROS:   All systems reviewed and negative except as noted in the HPI.   Past Surgical History:  Procedure Laterality Date  . DILITATION & CURRETTAGE/HYSTROSCOPY WITH NOVASURE ABLATION N/A 08/16/2017   Procedure: DILATATION &  CURETTAGE/HYSTEROSCOPY WITH MINERVA  ENDOMETRIAL ABLATION;  Surgeon: Florian Buff, MD;  Location: [facility];  Service: Gynecology;  Laterality: N/A;  . TUBAL LIGATION    . VAGINAL HYSTERECTOMY N/A 10/17/2018   Procedure: HYSTERECTOMY VAGINAL;  Surgeon: Florian Buff, MD;  Location: [facility];  Service: Gynecology;  Laterality: N/A;  . WRIST SURGERY Right      Family History  Problem Relation Age of Onset  . Hypertension Mother   . Heart disease Mother   . COPD Father   . Hypertension Maternal Aunt   . Diabetes Maternal Grandmother   . Kidney disease Sister      Social History   Socioeconomic History  . Marital status: Single    Spouse name: Not on file  . Number of children: 4  . Years of education: Not on file  . Highest education level: Not on file  Occupational History  . Not on file  Social Needs  . Financial resource strain: Not on file  . Food insecurity    Worry: Not on file    Inability: Not on file  . Transportation needs    Medical: Not on file    Non-medical: Not on file  Tobacco Use  . Smoking status: Former Smoker    Packs/day: 1.00    Years: 33.00    Pack years: 33.00    Types: Cigarettes    Quit date: 04/02/2008  Years since quitting: 10.6  . Smokeless tobacco: Never Used  Substance and Sexual Activity  . Alcohol use: No  . Drug use: No  . Sexual activity: Not Currently    Birth control/protection: Surgical  Lifestyle  . Physical activity    Days per week: Not on file    Minutes per session: Not on file  . Stress: Not on file  Relationships  . Social Musician on phone: Not on file    Gets together: Not on file    Attends religious service: Not on file    Active member of club or organization: Not on file    Attends meetings of clubs or organizations: Not on file    Relationship status: Not on file  . Intimate partner violence    Fear of current or ex partner: Not on file    Emotionally abused: Not on file    Physically abused:  Not on file    Forced sexual activity: Not on file  Other Topics Concern  . Not on file  Social History Narrative  . Not on file     BP 126/80   Pulse 65   Temp (!) 96.6 F (35.9 C) (Temporal)   Ht 5' 6.5" (1.689 m)   Wt 293 lb (132.9 kg)   LMP 07/05/2017 (Approximate)   SpO2 99%   BMI 46.58 kg/m   Physical Exam:  Well appearing NAD HEENT: Unremarkable Neck:  No JVD, no thyromegally Lymphatics:  No adenopathy Back:  No CVA tenderness Lungs:  Clear with no wheezes HEART:  Regular rate rhythm, no murmurs, no rubs, no clicks Abd:  soft, positive bowel sounds, no organomegally, no rebound, no guarding Ext:  2 plus pulses, no edema, no cyanosis, no clubbing Skin:  No rashes no nodules Neuro:  CN II through XII intact, motor grossly intact   Assess/Plan: 1. SVT - I have discussed the treatment options and the risks/benefits/goals/expectations of EP study and catheter ablation were reviewed and she wishes to proceed. 2. Obesity - once she has undergone ablation, she will be strongly encouraged to increase her physical activity. 3. Dysmenorrhea - this has resolved s/p vaginal hysterectomy  Leonia Reeves.D.

## 2018-11-13 NOTE — H&P (View-Only) (Signed)
HPI [name] is referred today by Dr. Johnsie Cancel for evaluation of SVT. She is an obese 48 year old woman with a history of hypertension and chronic back pain, remote tobacco abuse, who stopped smoking 10 years ago. The patient developed palpitations at age 28 although she initially did not seek medical attention. As a young adult, she would have one or 2 episodes a year. Over the last 3-4 years, her episodes have increased in frequency and severity resulting in multiple emergency room visits. The patient states that her episodes start and stop suddenly and are associated with shortness of breath and chest pressure. She has not had syncope but notes near syncope. Intravenous adenosine terminates the SVT when the episodes do not stop on her own. She has been placed on medical therapy with beta blockers, but has developed symptomatic bradycardia. She feels fatigued and tired on her medications. Allergies  Allergen Reactions  . Clindamycin/Lincomycin Rash     Current Outpatient Medications  Medication Sig Dispense Refill  . aspirin 81 MG chewable tablet Chew 162 mg by mouth daily.    . metoprolol succinate (TOPROL XL) 25 MG 24 hr tablet Take 1 tablet (25 mg total) by mouth daily. 90 tablet 3  . Multiple Vitamins-Minerals (ALIVE WOMENS GUMMY PO) Take 2 each by mouth daily. Chewables     No current facility-administered medications for this visit.      Past Medical History:  Diagnosis Date  . Anemia    d/t gynecological issues  . Anxiety   . Arthritis   . Back injury   . Chronic back pain   . Depression   . Dysrhythmia    HX SVT  . Fatty liver   . GERD (gastroesophageal reflux disease)   . Hypertension   . SVT (supraventricular tachycardia) (HCC)     ROS:   All systems reviewed and negative except as noted in the HPI.   Past Surgical History:  Procedure Laterality Date  . DILITATION & CURRETTAGE/HYSTROSCOPY WITH NOVASURE ABLATION N/A 08/16/2017   Procedure: DILATATION &  CURETTAGE/HYSTEROSCOPY WITH MINERVA  ENDOMETRIAL ABLATION;  Surgeon: Florian Buff, MD;  Location: [facility];  Service: Gynecology;  Laterality: N/A;  . TUBAL LIGATION    . VAGINAL HYSTERECTOMY N/A 10/17/2018   Procedure: HYSTERECTOMY VAGINAL;  Surgeon: Florian Buff, MD;  Location: [facility];  Service: Gynecology;  Laterality: N/A;  . WRIST SURGERY Right      Family History  Problem Relation Age of Onset  . Hypertension Mother   . Heart disease Mother   . COPD Father   . Hypertension Maternal Aunt   . Diabetes Maternal Grandmother   . Kidney disease Sister      Social History   Socioeconomic History  . Marital status: Single    Spouse name: Not on file  . Number of children: 4  . Years of education: Not on file  . Highest education level: Not on file  Occupational History  . Not on file  Social Needs  . Financial resource strain: Not on file  . Food insecurity    Worry: Not on file    Inability: Not on file  . Transportation needs    Medical: Not on file    Non-medical: Not on file  Tobacco Use  . Smoking status: Former Smoker    Packs/day: 1.00    Years: 33.00    Pack years: 33.00    Types: Cigarettes    Quit date: 04/02/2008  Years since quitting: 10.6  . Smokeless tobacco: Never Used  Substance and Sexual Activity  . Alcohol use: No  . Drug use: No  . Sexual activity: Not Currently    Birth control/protection: Surgical  Lifestyle  . Physical activity    Days per week: Not on file    Minutes per session: Not on file  . Stress: Not on file  Relationships  . Social connections    Talks on phone: Not on file    Gets together: Not on file    Attends religious service: Not on file    Active member of club or organization: Not on file    Attends meetings of clubs or organizations: Not on file    Relationship status: Not on file  . Intimate partner violence    Fear of current or ex partner: Not on file    Emotionally abused: Not on file    Physically abused:  Not on file    Forced sexual activity: Not on file  Other Topics Concern  . Not on file  Social History Narrative  . Not on file     BP 126/80   Pulse 65   Temp (!) 96.6 F (35.9 C) (Temporal)   Ht 5' 6.5" (1.689 m)   Wt 293 lb (132.9 kg)   LMP 07/05/2017 (Approximate)   SpO2 99%   BMI 46.58 kg/m   Physical Exam:  Well appearing NAD HEENT: Unremarkable Neck:  No JVD, no thyromegally Lymphatics:  No adenopathy Back:  No CVA tenderness Lungs:  Clear with no wheezes HEART:  Regular rate rhythm, no murmurs, no rubs, no clicks Abd:  soft, positive bowel sounds, no organomegally, no rebound, no guarding Ext:  2 plus pulses, no edema, no cyanosis, no clubbing Skin:  No rashes no nodules Neuro:  CN II through XII intact, motor grossly intact   Assess/Plan: 1. SVT - I have discussed the treatment options and the risks/benefits/goals/expectations of EP study and catheter ablation were reviewed and she wishes to proceed. 2. Obesity - once she has undergone ablation, she will be strongly encouraged to increase her physical activity. 3. Dysmenorrhea - this has resolved s/p vaginal hysterectomy  Darrion Wyszynski,M.D. 

## 2018-11-13 NOTE — Patient Instructions (Signed)
Medication Instructions:  Your physician recommends that you continue on your current medications as directed. Please refer to the Current Medication list given to you today.  If you need a refill on your cardiac medications before your next appointment, please call your pharmacy.   Lab work: Your physician recommends that you return for lab work in: Today   If you have labs (blood work) drawn today and your tests are completely normal, you will receive your results only by: Marland Kitchen MyChart Message (if you have MyChart) OR . A paper copy in the mail If you have any lab test that is abnormal or we need to change your treatment, we will call you to review the results.  Testing/Procedures: Your physician has recommended that you have an ablation. Catheter ablation is a medical procedure used to treat some cardiac arrhythmias (irregular heartbeats). During catheter ablation, a long, thin, flexible tube is put into a blood vessel in your groin (upper thigh), or neck. This tube is called an ablation catheter. It is then guided to your heart through the blood vessel. Radio frequency waves destroy small areas of heart tissue where abnormal heartbeats may cause an arrhythmia to start. Please see the instruction sheet given to you today.   Follow-Up: At [facility], you and your health needs are our priority.  As part of our continuing mission to provide you with exceptional heart care, we have created designated Provider Care Teams.  These Care Teams include your primary Cardiologist (physician) and Advanced Practice Providers (APPs -  Physician Assistants and Nurse Practitioners) who all work together to provide you with the care you need, when you need it. You will need a follow up appointment after your ablation.  Please call our office 2 months in advance to schedule this appointment.  You may see None or one of the following Advanced Practice Providers on your designated Care Team:   Chanetta Marshall, NP .  Tommye Standard, PA-C  Any Other Special Instructions Will Be Listed Below (If Applicable). Thank you for choosing [facility]!

## 2018-11-16 ENCOUNTER — Encounter (HOSPITAL_COMMUNITY)
Admission: RE | Disposition: A | Discharge status: Home / Self Care | Payer: Self-pay | Source: Home / Self Care | Attending: Internal Medicine

## 2018-11-16 ENCOUNTER — Ambulatory Visit (HOSPITAL_COMMUNITY)
Admission: RE | Admit: 2018-11-16 | Discharge: 2018-11-16 | Disposition: A | Discharge status: Home / Self Care | Payer: Self-pay | Attending: Internal Medicine | Admitting: Internal Medicine

## 2018-11-16 ENCOUNTER — Other Ambulatory Visit: Payer: Self-pay

## 2018-11-16 DIAGNOSIS — M1990 Unspecified osteoarthritis, unspecified site: Secondary | ICD-10-CM | POA: Insufficient documentation

## 2018-11-16 DIAGNOSIS — F419 Anxiety disorder, unspecified: Secondary | ICD-10-CM | POA: Insufficient documentation

## 2018-11-16 DIAGNOSIS — I471 Supraventricular tachycardia, unspecified: Secondary | ICD-10-CM | POA: Diagnosis not present

## 2018-11-16 DIAGNOSIS — K219 Gastro-esophageal reflux disease without esophagitis: Secondary | ICD-10-CM | POA: Insufficient documentation

## 2018-11-16 DIAGNOSIS — K760 Fatty (change of) liver, not elsewhere classified: Secondary | ICD-10-CM | POA: Insufficient documentation

## 2018-11-16 DIAGNOSIS — Z8249 Family history of ischemic heart disease and other diseases of the circulatory system: Secondary | ICD-10-CM | POA: Insufficient documentation

## 2018-11-16 DIAGNOSIS — Z881 Allergy status to other antibiotic agents status: Secondary | ICD-10-CM | POA: Insufficient documentation

## 2018-11-16 DIAGNOSIS — Z7982 Long term (current) use of aspirin: Secondary | ICD-10-CM | POA: Insufficient documentation

## 2018-11-16 DIAGNOSIS — I10 Essential (primary) hypertension: Secondary | ICD-10-CM | POA: Insufficient documentation

## 2018-11-16 DIAGNOSIS — Z6841 Body Mass Index (BMI) 40.0 and over, adult: Secondary | ICD-10-CM | POA: Insufficient documentation

## 2018-11-16 DIAGNOSIS — Z87891 Personal history of nicotine dependence: Secondary | ICD-10-CM | POA: Insufficient documentation

## 2018-11-16 DIAGNOSIS — Z79899 Other long term (current) drug therapy: Secondary | ICD-10-CM | POA: Insufficient documentation

## 2018-11-16 DIAGNOSIS — E669 Obesity, unspecified: Secondary | ICD-10-CM | POA: Insufficient documentation

## 2018-11-16 HISTORY — PX: SVT ABLATION: EP1225

## 2018-11-16 SURGERY — SVT ABLATION

## 2018-11-16 MED ORDER — MIDAZOLAM HCL 5 MG/5ML IJ SOLN
INTRAMUSCULAR | Status: DC | PRN
  Administered 2018-11-16 (×4): 1 mg via INTRAVENOUS
  Administered 2018-11-16: 2 mg via INTRAVENOUS

## 2018-11-16 MED ORDER — BUPIVACAINE HCL (PF) 0.25 % IJ SOLN
INTRAMUSCULAR | Status: AC
  Filled 2018-11-16: qty 60

## 2018-11-16 MED ORDER — SODIUM CHLORIDE 0.9% FLUSH: 3.0000 mL | Freq: Two times a day (BID) | INTRAVENOUS | Status: DC

## 2018-11-16 MED ORDER — ACETAMINOPHEN 325 MG PO TABS: 650.0000 mg | ORAL_TABLET | ORAL | Status: DC | PRN

## 2018-11-16 MED ORDER — ONDANSETRON HCL 4 MG/2ML IJ SOLN: 4.0000 mg | Freq: Four times a day (QID) | INTRAMUSCULAR | Status: DC | PRN

## 2018-11-16 MED ORDER — MIDAZOLAM HCL 5 MG/5ML IJ SOLN
INTRAMUSCULAR | Status: AC
  Filled 2018-11-16: qty 5

## 2018-11-16 MED ORDER — SODIUM CHLORIDE 0.9 % IV SOLN: INTRAVENOUS | Status: DC

## 2018-11-16 MED ORDER — HEPARIN (PORCINE) IN NACL 1000-0.9 UT/500ML-% IV SOLN
INTRAVENOUS | Status: DC | PRN
  Administered 2018-11-16: 500 mL

## 2018-11-16 MED ORDER — HEPARIN (PORCINE) IN NACL 1000-0.9 UT/500ML-% IV SOLN
INTRAVENOUS | Status: AC
  Filled 2018-11-16: qty 500

## 2018-11-16 MED ORDER — SODIUM CHLORIDE 0.9% FLUSH: 3.0000 mL | INTRAVENOUS | Status: DC | PRN

## 2018-11-16 MED ORDER — FENTANYL CITRATE (PF) 100 MCG/2ML IJ SOLN
INTRAMUSCULAR | Status: AC
  Filled 2018-11-16: qty 2

## 2018-11-16 MED ORDER — FENTANYL CITRATE (PF) 100 MCG/2ML IJ SOLN
INTRAMUSCULAR | Status: DC | PRN
  Administered 2018-11-16 (×3): 12.5 ug via INTRAVENOUS
  Administered 2018-11-16: 25 ug via INTRAVENOUS
  Administered 2018-11-16: 12.5 ug via INTRAVENOUS

## 2018-11-16 MED ORDER — SODIUM CHLORIDE 0.9 % IV SOLN: 250.0000 mL | INTRAVENOUS | Status: DC | PRN

## 2018-11-16 MED ORDER — BUPIVACAINE HCL (PF) 0.25 % IJ SOLN
INTRAMUSCULAR | Status: DC | PRN
  Administered 2018-11-16: 60 mL

## 2018-11-16 SURGICAL SUPPLY — 11 items
BAG SNAP BAND KOVER 36X36 (MISCELLANEOUS) ×2 IMPLANT
CATH CELSIUS THERM D CV 7F (ABLATOR) ×2 IMPLANT
CATH HEX JOS 2-5-2 65CM 6F REP (CATHETERS) ×2 IMPLANT
CATH JOSEPH QUAD ALLRED 6F REP (CATHETERS) ×4 IMPLANT
PACK EP LATEX FREE (CUSTOM PROCEDURE TRAY) ×1
PACK EP LF (CUSTOM PROCEDURE TRAY) ×1 IMPLANT
PAD PRO RADIOLUCENT 2001M-C (PAD) ×2 IMPLANT
SHEATH PINNACLE 6F 10CM (SHEATH) ×4 IMPLANT
SHEATH PINNACLE 7F 10CM (SHEATH) ×2 IMPLANT
SHEATH PINNACLE 8F 10CM (SHEATH) ×2 IMPLANT
SHIELD RADPAD SCOOP 12X17 (MISCELLANEOUS) ×2 IMPLANT

## 2018-11-16 NOTE — Discharge Instructions (Signed)

## 2018-11-16 NOTE — Interval H&P Note (Signed)
History and Physical Interval Note:  11/16/2018 12:27 PM  Donna Long  has presented today for surgery, with the diagnosis of svt.  The various methods of treatment have been discussed with the patient and family. After consideration of risks, benefits and other options for treatment, the patient has consented to  Procedure(s): SVT ABLATION (N/A) as a surgical intervention.  The patient's history has been reviewed, patient examined, no change in status, stable for surgery.  I have reviewed the patient's chart and labs.  Questions were answered to the patient's satisfaction.     [name]

## 2018-11-16 NOTE — Progress Notes (Signed)
No bleeding or swelling noted to right groin after ambulation 

## 2018-11-16 NOTE — Progress Notes (Signed)
Site area: Rt IJ and Rt fem venous sheaths Site Prior to Removal:  Level 0 Pressure Applied For: Rt IJ and Rt fem venous held for 60min Manual:   Yes Patient Status During Pull:  A/O Post Pull Site:  Level 0 Post Pull Instructions Given:  Post sheath instructions given and pt understands. Post Pull Pulses Present: N/A Dressing Applied: 2x2 and Tegaderm applied to Rt IJ. Tegaderm and a 4x4 applied to Rt fem venous site  Bedrest begins @ 15:20:00 Comments: Report given to Amy-RN. Rt Ij and rt fem sites are level 0.

## 2018-11-18 ENCOUNTER — Emergency Department (HOSPITAL_COMMUNITY): Payer: Self-pay

## 2018-11-18 ENCOUNTER — Other Ambulatory Visit: Payer: Self-pay

## 2018-11-18 ENCOUNTER — Telehealth: Payer: Self-pay

## 2018-11-18 ENCOUNTER — Encounter (HOSPITAL_COMMUNITY): Payer: Self-pay | Admitting: Emergency Medicine

## 2018-11-18 ENCOUNTER — Emergency Department (HOSPITAL_COMMUNITY)
Admission: EM | Admit: 2018-11-18 | Discharge: 2018-11-18 | Disposition: A | Discharge status: Home / Self Care | Payer: Self-pay | Attending: Emergency Medicine | Admitting: Emergency Medicine

## 2018-11-18 DIAGNOSIS — Z7982 Long term (current) use of aspirin: Secondary | ICD-10-CM | POA: Insufficient documentation

## 2018-11-18 DIAGNOSIS — I10 Essential (primary) hypertension: Secondary | ICD-10-CM | POA: Insufficient documentation

## 2018-11-18 DIAGNOSIS — E669 Obesity, unspecified: Secondary | ICD-10-CM | POA: Insufficient documentation

## 2018-11-18 DIAGNOSIS — Z79899 Other long term (current) drug therapy: Secondary | ICD-10-CM | POA: Insufficient documentation

## 2018-11-18 DIAGNOSIS — R350 Frequency of micturition: Secondary | ICD-10-CM | POA: Insufficient documentation

## 2018-11-18 DIAGNOSIS — Z6841 Body Mass Index (BMI) 40.0 and over, adult: Secondary | ICD-10-CM | POA: Insufficient documentation

## 2018-11-18 DIAGNOSIS — Z87891 Personal history of nicotine dependence: Secondary | ICD-10-CM | POA: Insufficient documentation

## 2018-11-18 DIAGNOSIS — R1032 Left lower quadrant pain: Secondary | ICD-10-CM | POA: Insufficient documentation

## 2018-11-18 LAB — CBC WITH DIFFERENTIAL/PLATELET
Abs Immature Granulocytes: 0.03 10*3/uL (ref 0.00–0.07)
Basophils Absolute: 0.1 10*3/uL (ref 0.0–0.1)
Basophils Relative: 1 %
Eosinophils Absolute: 0.3 10*3/uL (ref 0.0–0.5)
Eosinophils Relative: 3 %
HCT: 41.5 % (ref 36.0–46.0)
Hemoglobin: 12.4 g/dL (ref 12.0–15.0)
Immature Granulocytes: 0 %
Lymphocytes Relative: 27 %
Lymphs Abs: 2.5 10*3/uL (ref 0.7–4.0)
MCH: 24.7 pg — ABNORMAL LOW (ref 26.0–34.0)
MCHC: 29.9 g/dL — ABNORMAL LOW (ref 30.0–36.0)
MCV: 82.5 fL (ref 80.0–100.0)
Monocytes Absolute: 0.7 10*3/uL (ref 0.1–1.0)
Monocytes Relative: 7 %
Neutro Abs: 5.9 10*3/uL (ref 1.7–7.7)
Neutrophils Relative %: 62 %
Platelets: 301 10*3/uL (ref 150–400)
RBC: 5.03 MIL/uL (ref 3.87–5.11)
RDW: 22.5 % — ABNORMAL HIGH (ref 11.5–15.5)
WBC: 9.4 10*3/uL (ref 4.0–10.5)
nRBC: 0 % (ref 0.0–0.2)

## 2018-11-18 LAB — URINALYSIS, ROUTINE W REFLEX MICROSCOPIC
Bacteria, UA: NONE SEEN
Bilirubin Urine: NEGATIVE
Glucose, UA: NEGATIVE mg/dL
Ketones, ur: NEGATIVE mg/dL
Nitrite: NEGATIVE
Protein, ur: NEGATIVE mg/dL
Specific Gravity, Urine: 1.004 — ABNORMAL LOW (ref 1.005–1.030)
pH: 6 (ref 5.0–8.0)

## 2018-11-18 LAB — COMPREHENSIVE METABOLIC PANEL
ALT: 63 U/L — ABNORMAL HIGH (ref 0–44)
AST: 56 U/L — ABNORMAL HIGH (ref 15–41)
Albumin: 3.6 g/dL (ref 3.5–5.0)
Alkaline Phosphatase: 99 U/L (ref 38–126)
Anion gap: 7 (ref 5–15)
BUN: 11 mg/dL (ref 6–20)
CO2: 25 mmol/L (ref 22–32)
Calcium: 8.8 mg/dL — ABNORMAL LOW (ref 8.9–10.3)
Chloride: 106 mmol/L (ref 98–111)
Creatinine, Ser: 0.74 mg/dL (ref 0.44–1.00)
GFR calc Af Amer: >60 mL/min (ref >60)
GFR calc non Af Amer: >60 mL/min (ref >60)
Glucose, Bld: 120 mg/dL — ABNORMAL HIGH (ref 70–99)
Potassium: 3.8 mmol/L (ref 3.5–5.1)
Sodium: 138 mmol/L (ref 135–145)
Total Bilirubin: 0.4 mg/dL (ref 0.3–1.2)
Total Protein: 7.3 g/dL (ref 6.5–8.1)

## 2018-11-18 LAB — LIPASE, BLOOD: Lipase: 27 U/L (ref 11–51)

## 2018-11-18 MED ORDER — PHENAZOPYRIDINE HCL 100 MG PO TABS
200.0000 mg | ORAL_TABLET | Freq: Once | ORAL | Status: AC
  Administered 2018-11-18: 200 mg via ORAL
  Filled 2018-11-18: qty 2

## 2018-11-18 MED ORDER — IOHEXOL 300 MG/ML  SOLN
100.0000 mL | Freq: Once | INTRAMUSCULAR | Status: AC | PRN
  Administered 2018-11-18: 100 mL via INTRAVENOUS

## 2018-11-18 MED ORDER — SODIUM CHLORIDE 0.9 % IV BOLUS
1000.0000 mL | Freq: Once | INTRAVENOUS | Status: AC
  Administered 2018-11-18: 1000 mL via INTRAVENOUS

## 2018-11-18 MED ORDER — PHENAZOPYRIDINE HCL 200 MG PO TABS: 200.0000 mg | ORAL_TABLET | Freq: Three times a day (TID) | ORAL | 0 refills | Status: DC

## 2018-11-18 NOTE — ED Notes (Signed)
ED Provider at bedside. 

## 2018-11-18 NOTE — Discharge Instructions (Addendum)
Take the Pyridium 3 times a day to see if that will help with your discomfort.  Call Dr. Chipper Oman office on Monday, October 12 if you are not feeling better.  Return to the emergency room if you get fever, vomiting, or you seem worse.

## 2018-11-18 NOTE — ED Notes (Signed)
Patient transported to X-ray 

## 2018-11-18 NOTE — ED Provider Notes (Signed)
[facility] EMERGENCY DEPARTMENT Provider Note   CSN: [id number] Arrival date & time: 11/18/18  0210   Time seen 2:40 AM  History   Chief Complaint Chief Complaint  Patient presents with   Abdominal Pain    HPI Donna Long is a 48 y.o. female.     HPI patient states she had a vaginal hysterectomy done on September 9.  She states she did have some lower abdominal pain however it was getting better.  She states she had a cardiac ablation done on October 9.  She states about 3 days ago she started having pain in her left lower quadrant.  She states it hurts more with urination, bowel movements, eating, or passing gas.  She feels like her abdomen is a little distended more than usual.  She denies hematuria but states she is having frequency and sometimes urgency.  She has nausea without vomiting but she chronically has nausea that has not changed.  She denies diarrhea or history of diverticulitis.  She denies loss of appetite.  She states when she sits she feels like she is sitting on a butcher knife in her vagina.  She denies vaginal bleeding or any vaginal drainage.  PCP Patient, No Pcp Per GYN Dr Despina Hidden    Past Medical History:  Diagnosis Date   Anemia    d/t gynecological issues   Anxiety    Arthritis    Back injury    Chronic back pain    Depression    Dysrhythmia    HX SVT   Fatty liver    GERD (gastroesophageal reflux disease)    Hypertension    SVT (supraventricular tachycardia) [facility])     Patient Active Problem List   Diagnosis Date Noted   SVT (supraventricular tachycardia) (HCC) 11/16/2018   S/P vaginal hysterectomy 10/17/2018   Chest pain 06/30/2018   Hyperglycemia 06/30/2018   Protrusion of lumbar intervertebral disc 05/04/2017   Anemia 11/04/2016   Anxiety 11/04/2016    Past Surgical History:  Procedure Laterality Date   DILITATION & CURRETTAGE/HYSTROSCOPY WITH NOVASURE ABLATION N/A 08/16/2017   Procedure: DILATATION &  CURETTAGE/HYSTEROSCOPY WITH MINERVA  ENDOMETRIAL ABLATION;  Surgeon: Lazaro Arms, MD;  Location: [facility] ORS;  Service: Gynecology;  Laterality: N/A;   TUBAL LIGATION     VAGINAL HYSTERECTOMY N/A 10/17/2018   Procedure: HYSTERECTOMY VAGINAL;  Surgeon: Lazaro Arms, MD;  Location: [facility] ORS;  Service: Gynecology;  Laterality: N/A;   WRIST SURGERY Right      OB History    Gravida  4   Para  4   Term      Preterm      AB      Living  4     SAB      TAB      Ectopic      Multiple      Live Births               Home Medications    Prior to Admission medications   Medication Sig Start Date End Date Taking? Authorizing Provider  metoprolol succinate (TOPROL XL) 25 MG 24 hr tablet Take 1 tablet (25 mg total) by mouth daily. Patient taking differently: Take 25 mg by mouth as needed.  10/26/18  Yes Leone Brand, NP  Multiple Vitamins-Minerals (ALIVE WOMENS GUMMY PO) Take 2 each by mouth daily. Chewables   Yes [provider]  aspirin 81 MG chewable tablet Chew 162 mg by mouth daily.  [provider]  phenazopyridine (PYRIDIUM) 200 MG tablet Take 1 tablet (200 mg total) by mouth 3 (three) times daily. 11/18/18   Devoria AlbeKnapp, Alexius Hangartner, MD    Family History Family History  Problem Relation Age of Onset   Hypertension Mother    Heart disease Mother    COPD Father    Hypertension Maternal Aunt    Diabetes Maternal Grandmother    Kidney disease Sister     Social History Social History   Tobacco Use   Smoking status: Former Smoker    Packs/day: 1.00    Years: 33.00    Pack years: 33.00    Types: Cigarettes    Quit date: 04/02/2008    Years since quitting: 10.6   Smokeless tobacco: Never Used  Substance Use Topics   Alcohol use: No   Drug use: No     Allergies   Clindamycin/lincomycin   Review of Systems Review of Systems  All other systems reviewed and are negative.    Physical Exam Updated Vital Signs Ht 5' 6.5" (1.689 m)     Wt 131.5 kg    LMP 07/05/2017 (Approximate)    BMI 46.11 kg/m   Physical Exam Vitals signs and nursing note reviewed.  Constitutional:      Appearance: Normal appearance. She is obese.  HENT:     Head: Normocephalic and atraumatic.     Right Ear: External ear normal.     Left Ear: External ear normal.     Nose: Nose normal.     Mouth/Throat:     Mouth: Mucous membranes are dry.  Eyes:     Extraocular Movements: Extraocular movements intact.     Conjunctiva/sclera: Conjunctivae normal.     Pupils: Pupils are equal, round, and reactive to light.  Neck:     Musculoskeletal: Normal range of motion.  Cardiovascular:     Rate and Rhythm: Normal rate and regular rhythm.     Pulses: Normal pulses.  Pulmonary:     Effort: Pulmonary effort is normal. No respiratory distress.     Breath sounds: Normal breath sounds.  Abdominal:     General: Bowel sounds are normal. There is distension.     Tenderness: There is abdominal tenderness in the left lower quadrant. There is no guarding or rebound.       Comments: Very tender in the left lower quadrant.  She also has some tenderness in her upper abdomen but she states she has known areas of tenderness are from hernias that she has had.  Musculoskeletal: Normal range of motion.  Skin:    General: Skin is warm and dry.  Neurological:     General: No focal deficit present.     Mental Status: She is alert and oriented to person, place, and time.     Cranial Nerves: No cranial nerve deficit.  Psychiatric:        Mood and Affect: Mood normal.        Behavior: Behavior normal.        Thought Content: Thought content normal.      ED Treatments / Results  Labs  Results for orders placed or performed during the hospital encounter of 11/18/18  Urinalysis, Routine w reflex microscopic  Result Value Ref Range   Color, Urine STRAW (A) YELLOW   APPearance CLEAR CLEAR   Specific Gravity, Urine 1.004 (L) 1.005 - 1.030   pH 6.0 5.0 - 8.0    Glucose, UA NEGATIVE NEGATIVE mg/dL   Hgb urine dipstick  SMALL (A) NEGATIVE   Bilirubin Urine NEGATIVE NEGATIVE   Ketones, ur NEGATIVE NEGATIVE mg/dL   Protein, ur NEGATIVE NEGATIVE mg/dL   Nitrite NEGATIVE NEGATIVE   Leukocytes,Ua TRACE (A) NEGATIVE   RBC / HPF 0-5 0 - 5 RBC/hpf   WBC, UA 0-5 0 - 5 WBC/hpf   Bacteria, UA NONE SEEN NONE SEEN   Squamous Epithelial / LPF 0-5 0 - 5  Comprehensive metabolic panel  Result Value Ref Range   Sodium 138 135 - 145 mmol/L   Potassium 3.8 3.5 - 5.1 mmol/L   Chloride 106 98 - 111 mmol/L   CO2 25 22 - 32 mmol/L   Glucose, Bld 120 (H) 70 - 99 mg/dL   BUN 11 6 - 20 mg/dL   Creatinine, Ser 0.74 0.44 - 1.00 mg/dL   Calcium 8.8 (L) 8.9 - 10.3 mg/dL   Total Protein 7.3 6.5 - 8.1 g/dL   Albumin 3.6 3.5 - 5.0 g/dL   AST 56 (H) 15 - 41 U/L   ALT 63 (H) 0 - 44 U/L   Alkaline Phosphatase 99 38 - 126 U/L   Total Bilirubin 0.4 0.3 - 1.2 mg/dL   GFR calc non Af Amer >60 >60 mL/min   GFR calc Af Amer >60 >60 mL/min   Anion gap 7 5 - 15  Lipase, blood  Result Value Ref Range   Lipase 27 11 - 51 U/L  CBC with Differential  Result Value Ref Range   WBC 9.4 4.0 - 10.5 K/uL   RBC 5.03 3.87 - 5.11 MIL/uL   Hemoglobin 12.4 12.0 - 15.0 g/dL   HCT 41.5 36.0 - 46.0 %   MCV 82.5 80.0 - 100.0 fL   MCH 24.7 (L) 26.0 - 34.0 pg   MCHC 29.9 (L) 30.0 - 36.0 g/dL   RDW 22.5 (H) 11.5 - 15.5 %   Platelets 301 150 - 400 K/uL   nRBC 0.0 0.0 - 0.2 %   Neutrophils Relative % 62 %   Neutro Abs 5.9 1.7 - 7.7 K/uL   Lymphocytes Relative 27 %   Lymphs Abs 2.5 0.7 - 4.0 K/uL   Monocytes Relative 7 %   Monocytes Absolute 0.7 0.1 - 1.0 K/uL   Eosinophils Relative 3 %   Eosinophils Absolute 0.3 0.0 - 0.5 K/uL   Basophils Relative 1 %   Basophils Absolute 0.1 0.0 - 0.1 K/uL   Immature Granulocytes 0 %   Abs Immature Granulocytes 0.03 0.00 - 0.07 K/uL     Laboratory interpretation all normal except minor elevation of LFTs,     (all labs ordered are listed, but  only abnormal results are displayed) Results for orders placed or performed during the hospital encounter of 11/13/18  SARS CORONAVIRUS 2 (TAT 6-24 HRS) Nasopharyngeal Nasopharyngeal Swab   Specimen: Nasopharyngeal Swab  Result Value Ref Range   SARS Coronavirus 2 NEGATIVE NEGATIVE      EKG None  Radiology Ct Abdomen Pelvis W Contrast  Result Date: 11/18/2018 CLINICAL DATA:  Recent vaginal hysterectomy. Acute pelvic discomfort. Worsening pain. Worsening pain with sitting and bowel movement. EXAM: CT ABDOMEN AND PELVIS WITH CONTRAST TECHNIQUE: Multidetector CT imaging of the abdomen and pelvis was performed using the standard protocol following bolus administration of intravenous contrast. CONTRAST:  180mL OMNIPAQUE IOHEXOL 300 MG/ML  SOLN COMPARISON:  CT Jun 16, 2018 FINDINGS: Lower chest: Lung bases are clear. Hepatobiliary: No focal hepatic lesion. No biliary duct dilatation. Gallbladder is normal. Common bile duct is normal. Pancreas:  Pancreas is normal. No ductal dilatation. No pancreatic inflammation. Spleen: Normal spleen Adrenals/urinary tract: Adrenal glands and kidneys are normal. The ureters and bladder normal. Stomach/Bowel: Stomach, small-bowel appendix and cecum normal.: And rectosigmoid colon normal. Vascular/Lymphatic: Abdominal aorta is normal caliber. No periportal or retroperitoneal adenopathy. No pelvic adenopathy. Reproductive: Post hysterectomy. No fluid collections at the vaginal cuff. Ovaries are normal. No fluid collection within the pelvis identified. No clear inflammation. Mild haziness in the broad ligaments. Delayed imaging through the pelvis demonstrates normal ureters and bladder. Perineum grossly normal by CT. Other: No free fluid. Musculoskeletal: No aggressive osseous lesion. IMPRESSION: 1. No obvious complication following hysterectomy. Mild haziness in the broad ligaments following surgery. No fluid collections within the pelvis. Vaginal cuff appears normal. Ovaries  normal. 2. Delayed imaging through the pelvis demonstrates normal ureters. Normal bladder. Electronically Signed   By: Genevive Bi M.D.   On: 11/18/2018 05:09    Procedures Procedures (including critical care time)  Medications Ordered in ED Medications  sodium chloride 0.9 % bolus 1,000 mL (0 mLs Intravenous Stopped 11/18/18 0517)  iohexol (OMNIPAQUE) 300 MG/ML solution 100 mL (100 mLs Intravenous Contrast Given 11/18/18 0419)  phenazopyridine (PYRIDIUM) tablet 200 mg (200 mg Oral Given 11/18/18 0521)     Initial Impression / Assessment and Plan / ED Course  I have reviewed the triage vital signs and the nursing notes.  Pertinent labs & imaging results that were available during my care of the patient were reviewed by me and considered in my medical decision making (see chart for details).       Patient refused pain medication, she states she is drove herself to the ED and she needs to drive herself back home.  CT scan was done to further evaluate her abdominal pain.  It is not clear whether this is from her prior surgery or if she has a new problem such as diverticulitis.  After reviewing her x-ray and her laboratory test she was given peridium orally to see if that would help of her discomfort.  6:00 AM patient reports the Pyridium is helping her discomfort.  She feels ready to be discharged.  She had a prescription sent to her pharmacy for 2 more days of Pyridium.  She should follow-up with Dr. Despina Hidden on Monday to let him know if she is not improving.    Final Clinical Impressions(s) / ED Diagnoses   Final diagnoses:  Left lower quadrant abdominal pain  Urinary frequency    ED Discharge Orders         Ordered    phenazopyridine (PYRIDIUM) 200 MG tablet  3 times daily     11/18/18 0601          Plan discharge  Devoria Albe, MD, Concha Pyo, MD 11/18/18 [phone]

## 2018-11-18 NOTE — ED Triage Notes (Signed)
Pt states she had a vaginal hysterectomy on Sept 9, 2020. Pt states she has had abdominal discomfort since then but that the pain has gotten worse "feels like I'm sitting on a butcher knife" over the last 3 days. Pt's pain is L. Lower abdominal area and worse when having to sit, urinate, or have BM. Pt has a little burning with urination but denies seeing any blood. Pt unable to sit during triage d/t pain.

## 2018-11-19 ENCOUNTER — Encounter (HOSPITAL_COMMUNITY): Payer: Self-pay | Admitting: Internal Medicine

## 2018-11-19 ENCOUNTER — Other Ambulatory Visit (HOSPITAL_COMMUNITY): Payer: Self-pay

## 2018-11-19 LAB — URINE CULTURE
Culture: NO GROWTH
Special Requests: NORMAL

## 2018-11-20 ENCOUNTER — Other Ambulatory Visit: Payer: Self-pay

## 2018-11-20 ENCOUNTER — Other Ambulatory Visit (HOSPITAL_COMMUNITY)
Admission: RE | Admit: 2018-11-20 | Discharge: 2018-11-20 | Disposition: A | Discharge status: Home / Self Care | Payer: Self-pay | Source: Ambulatory Visit | Attending: Cardiology | Admitting: Cardiology

## 2018-11-20 DIAGNOSIS — Z01812 Encounter for preprocedural laboratory examination: Secondary | ICD-10-CM | POA: Insufficient documentation

## 2018-11-20 DIAGNOSIS — Z20828 Contact with and (suspected) exposure to other viral communicable diseases: Secondary | ICD-10-CM | POA: Insufficient documentation

## 2018-11-20 LAB — SARS CORONAVIRUS 2 (TAT 6-24 HRS): SARS Coronavirus 2: NEGATIVE

## 2018-11-23 ENCOUNTER — Other Ambulatory Visit: Payer: Self-pay

## 2018-11-23 ENCOUNTER — Ambulatory Visit: Payer: Self-pay | Attending: Cardiology | Admitting: Cardiology

## 2018-11-23 DIAGNOSIS — G4733 Obstructive sleep apnea (adult) (pediatric): Secondary | ICD-10-CM | POA: Insufficient documentation

## 2018-11-23 DIAGNOSIS — G4730 Sleep apnea, unspecified: Secondary | ICD-10-CM

## 2018-11-23 DIAGNOSIS — I493 Ventricular premature depolarization: Secondary | ICD-10-CM | POA: Insufficient documentation

## 2018-11-23 DIAGNOSIS — R0683 Snoring: Secondary | ICD-10-CM | POA: Insufficient documentation

## 2018-11-27 NOTE — Procedures (Signed)
   Patient Name: Donna Long, Donna Long Date:11/23/2018 Gender: Female D.O.B: October 15, 1970 Age (years): 85 Referring Provider: Not Available Height (inches): 67 Interpreting Physician: Fransico Him MD, ABSM Weight (lbs): 293 RPSGT: Peak, Robert BMI: 47 MRN: [id number] Neck Size: 15.50  CLINICAL INFORMATION Sleep Study Type: NPSG  Indication for sleep study: OSA  Epworth Sleepiness Score: 9  SLEEP STUDY TECHNIQUE As per the AASM Manual for the Scoring of Sleep and Associated Events v2.3 (April 2016) with a hypopnea requiring 4% desaturations.  The channels recorded and monitored were frontal, central and occipital EEG, electrooculogram (EOG), submentalis EMG (chin), nasal and oral airflow, thoracic and abdominal wall motion, anterior tibialis EMG, snore microphone, electrocardiogram, and pulse oximetry.  MEDICATIONS Medications self-administered by patient taken the night of the study : N/A  SLEEP ARCHITECTURE The study was initiated at 9:17:34 PM and ended at 4:55:52 AM.  Sleep onset time was 6.9 minutes and the sleep efficiency was 77.1%. The total sleep time was 353.4 minutes.  Stage REM latency was 77.5 minutes.  The patient spent 2.7% of the night in stage N1 sleep, 70.0% in stage N2 sleep, 15.0% in stage N3 and 12.3% in REM.  Alpha intrusion was absent.  Supine sleep was 0.00%.  RESPIRATORY PARAMETERS The overall apnea/hypopnea index (AHI) was 8.3 per hour. There were 1 total apneas, including 1 obstructive, 0 central and 0 mixed apneas. There were 48 hypopneas and 1 RERAs.  The AHI during Stage REM sleep was 41.4 per hour.  AHI while supine was N/A per hour.  The mean oxygen saturation was 94.2%. The minimum SpO2 during sleep was 84.0%.  loud snoring was noted during this study.  CARDIAC DATA The 2 lead EKG demonstrated sinus rhythm. The mean heart rate was 67.7 beats per minute. Other EKG findings include: PVCs and ventricular couplets.  LEG MOVEMENT DATA The  total PLMS were 0 with a resulting PLMS index of 0.0. Associated arousal with leg movement index was 0.0 .  IMPRESSIONS - Mild obstructive sleep apnea occurred during this study (AHI = 8.3/h). - No significant central sleep apnea occurred during this study (CAI = 0.0/h). - Mild oxygen desaturation was noted during this study (Min O2 = 84.0%). - The patient snored with loud snoring volume. - EKG findings include PVCs. - Clinically significant periodic limb movements did not occur during sleep. No significant associated arousals.  DIAGNOSIS - Obstructive Sleep Apnea (327.23 [G47.33 ICD-10])  RECOMMENDATIONS - Therapeutic CPAP titration to determine optimal pressure required to alleviate sleep disordered breathing. - Avoid alcohol, sedatives and other CNS depressants that may worsen sleep apnea and disrupt normal sleep architecture. - Sleep hygiene should be reviewed to assess factors that may improve sleep quality. - Weight management and regular exercise should be initiated or continued if appropriate.  [Electronically signed] 11/27/2018 07:10 PM  Fransico Him MD, ABSM Diplomate, American Board of Sleep Medicine

## 2018-11-28 ENCOUNTER — Encounter: Payer: Self-pay | Admitting: Physician Assistant

## 2018-11-29 ENCOUNTER — Telehealth: Payer: Self-pay | Admitting: *Deleted

## 2018-11-29 NOTE — Telephone Encounter (Signed)
Informed patient of sleep study results and patient understanding was verbalized. Patient understands her sleep study showed  they have sleep apnea and recommend CPAP titration. Please set up titration in the sleep lab.    Titration sent to sleep lab

## 2018-11-29 NOTE — Telephone Encounter (Signed)
-----   Message from Sueanne Margarita, MD sent at 11/27/2018  7:13 PM EDT ----- Please let patient know that they have sleep apnea and recommend CPAP titration. Please set up titration in the sleep lab.

## 2018-12-03 ENCOUNTER — Other Ambulatory Visit: Payer: Self-pay

## 2018-12-03 ENCOUNTER — Encounter: Payer: Self-pay | Admitting: Obstetrics & Gynecology

## 2018-12-03 ENCOUNTER — Ambulatory Visit (INDEPENDENT_AMBULATORY_CARE_PROVIDER_SITE_OTHER): Payer: Self-pay | Admitting: Obstetrics & Gynecology

## 2018-12-03 VITALS — BP 152/94 | HR 63 | Ht 66.0 in | Wt 291.5 lb

## 2018-12-03 DIAGNOSIS — Z90710 Acquired absence of both cervix and uterus: Secondary | ICD-10-CM

## 2018-12-03 DIAGNOSIS — Z9889 Other specified postprocedural states: Secondary | ICD-10-CM

## 2018-12-03 NOTE — Progress Notes (Signed)
  HPI: Patient returns for routine postoperative follow-up having undergone TVH on 10/17/2018.  The patient's immediate postoperative recovery has been unremarkable. Since hospital discharge the patient reports some occasional LLQ pain, CT scan was normal.   Current Outpatient Medications: metoprolol succinate (TOPROL XL) 25 MG 24 hr tablet, Take 1 tablet (25 mg total) by mouth daily. (Patient taking differently: Take 25 mg by mouth as needed. ), Disp: 90 tablet, Rfl: 3 Multiple Vitamins-Minerals (ALIVE WOMENS GUMMY PO), Take 2 each by mouth daily. Chewables, Disp: , Rfl:   No current facility-administered medications for this visit.     Blood pressure (!) 152/94, pulse 63, height 5\' 6"  (1.676 m), weight 291 lb 8 oz (132.2 kg), last menstrual period 07/05/2017.  Physical Exam: Vaginal cuff well healed  Bimanual normal  Diagnostic Tests:   Pathology: benign  Impression: S/p TVH, uncompicated post operative course  Plan: No sex for 1 month  Follow up: 3  years  Florian Buff, MD

## 2018-12-04 ENCOUNTER — Telehealth: Payer: Self-pay | Admitting: *Deleted

## 2018-12-04 DIAGNOSIS — G4730 Sleep apnea, unspecified: Secondary | ICD-10-CM

## 2018-12-04 NOTE — Telephone Encounter (Signed)
-----   Message from Lauralee Evener, [place] sent at 11/30/2018  9:57 AM EDT ----- Regarding: RE: precert Per chart no insurance. ----- Message ----- From: Freada Bergeron, CMA Sent: 11/29/2018  10:32 PM EDT To: [facility] Sleep Studies Subject: precert                                        recommend CPAP titration.

## 2018-12-06 ENCOUNTER — Ambulatory Visit (HOSPITAL_COMMUNITY)
Admission: RE | Admit: 2018-12-06 | Discharge: 2018-12-06 | Disposition: A | Discharge status: Home / Self Care | Payer: Self-pay | Source: Ambulatory Visit | Attending: Physician Assistant | Admitting: Physician Assistant

## 2018-12-06 ENCOUNTER — Encounter: Payer: Self-pay | Admitting: Physician Assistant

## 2018-12-06 ENCOUNTER — Other Ambulatory Visit: Payer: Self-pay

## 2018-12-06 ENCOUNTER — Other Ambulatory Visit (INDEPENDENT_AMBULATORY_CARE_PROVIDER_SITE_OTHER): Payer: Self-pay

## 2018-12-06 ENCOUNTER — Ambulatory Visit (INDEPENDENT_AMBULATORY_CARE_PROVIDER_SITE_OTHER): Payer: Self-pay | Admitting: Physician Assistant

## 2018-12-06 VITALS — BP 120/80 | HR 92 | Temp 98.1°F | Ht 66.5 in | Wt 293.0 lb

## 2018-12-06 DIAGNOSIS — R1032 Left lower quadrant pain: Secondary | ICD-10-CM

## 2018-12-06 DIAGNOSIS — R1030 Lower abdominal pain, unspecified: Secondary | ICD-10-CM

## 2018-12-06 LAB — BASIC METABOLIC PANEL
BUN: 7 mg/dL (ref 6–23)
CO2: 28 mEq/L (ref 19–32)
Calcium: 9.5 mg/dL (ref 8.4–10.5)
Chloride: 102 mEq/L (ref 96–112)
Creatinine, Ser: 0.69 mg/dL (ref 0.40–1.20)
GFR: 90.54 mL/min (ref >60.00)
Glucose, Bld: 137 mg/dL — ABNORMAL HIGH (ref 70–99)
Potassium: 3.9 mEq/L (ref 3.5–5.1)
Sodium: 137 mEq/L (ref 135–145)

## 2018-12-06 LAB — CBC WITH DIFFERENTIAL/PLATELET
Basophils Absolute: 0.1 10*3/uL (ref 0.0–0.1)
Basophils Relative: 1 % (ref 0.0–3.0)
Eosinophils Absolute: 0.3 10*3/uL (ref 0.0–0.7)
Eosinophils Relative: 2.4 % (ref 0.0–5.0)
HCT: 39.7 % (ref 36.0–46.0)
Hemoglobin: 12.7 g/dL (ref 12.0–15.0)
Lymphocytes Relative: 26.6 % (ref 12.0–46.0)
Lymphs Abs: 3.1 10*3/uL (ref 0.7–4.0)
MCHC: 32.1 g/dL (ref 30.0–36.0)
MCV: 78.1 fl (ref 78.0–100.0)
Monocytes Absolute: 0.6 10*3/uL (ref 0.1–1.0)
Monocytes Relative: 5.1 % (ref 3.0–12.0)
Neutro Abs: 7.5 10*3/uL (ref 1.4–7.7)
Neutrophils Relative %: 64.9 % (ref 43.0–77.0)
Platelets: 346 10*3/uL (ref 150.0–400.0)
RBC: 5.08 Mil/uL (ref 3.87–5.11)
RDW: 22.5 % — ABNORMAL HIGH (ref 11.5–15.5)
WBC: 11.5 10*3/uL — ABNORMAL HIGH (ref 4.0–10.5)

## 2018-12-06 LAB — SEDIMENTATION RATE: Sed Rate: 46 mm/hr — ABNORMAL HIGH (ref 0–20)

## 2018-12-06 MED ORDER — IOHEXOL 300 MG/ML  SOLN
100.0000 mL | Freq: Once | INTRAMUSCULAR | Status: AC | PRN
  Administered 2018-12-06: 100 mL via INTRAVENOUS

## 2018-12-06 MED ORDER — IOHEXOL 9 MG/ML PO SOLN
ORAL | Status: AC
  Filled 2018-12-06: qty 1000

## 2018-12-06 NOTE — Patient Instructions (Addendum)
  You are scheduled   at [facility] for your CT Scan. You should arrive 15 minutes prior to your appointment time for registration. Please follow the written instructions below on the day of your exam:  Go straight to [facility] radiology now, you can expect to be there three hours    Go to the basement for STAT labs today  If you are age 48 or older, your body mass index should be between 23-30. Your Body mass index is 46.58 kg/m. If this is out of the aforementioned range listed, please consider follow up with your Primary Care Provider.  If you are age 77 or younger, your body mass index should be between 19-25. Your Body mass index is 46.58 kg/m. If this is out of the aformentioned range listed, please consider follow up with your Primary Care Provider.     I appreciate the  opportunity to care for you  Thank You   Amy Shane Crutch

## 2018-12-06 NOTE — Progress Notes (Signed)
Subjective:    Patient ID: Donna Long, female    DOB: [birth year], 48 y.o.   MRN: [id number]  HPI Donna Long is a pleasant 48 year old white female, new to GI today, self-referred for progressively severe lower abdominal pain. She has history of SVT for which she underwent an ablation on 11/16/2018.  Also with sleep apnea, morbid obesity and anxiety.  She had undergone a total vaginal hysterectomy on 10/17/2018. She reports that she has had prior GI evaluation about 6 years ago while she was living in [place] and had undergone an upper endoscopy and was told that she had a hiatal hernia.  She has not had prior colonoscopy. Patient says that her current abdominal pain started after her hysterectomy, and a couple of days before she had the ablation done on 11/16/2018.  She says initially this started as a nagging discomfort in her lower abdomen which became progressively worse.  Now over the past 2 weeks she says the pain is significantly worse.  She describes it as being constant, located in the left lower abdomen and at times radiating up towards her mid abdomen.  He has had some associated bloating.  Over the past 2 nights she has had chills but no documented fever.  She has increased pain with bearing down to urinate and also with having bowel movements.  She feels better with standing walking and lying down and worse with sitting. She had an emergency room visit on 11/18/2018.  Labs at that time unremarkable with exception of AST of 56 and ALT of 63. CT of the abdomen and pelvis was done which showed some mild haziness in the broad ligaments felt consistent with postsurgical changes, no abscess bowel obstruction or other acute inflammatory process. She was seen by her gynecologist Dr. Elonda Husky on 12/03/2018, had pelvic exam, was told everything was okay and was not felt that the pain was related to her hysterectomy.  Review of Systems Pertinent positive and negative review of systems were noted in the above HPI  section.  All other review of systems was otherwise negative.  Outpatient Encounter Medications as of 12/06/2018  Medication Sig  . esomeprazole (NEXIUM) 20 MG packet Take 20 mg by mouth daily before breakfast. OTC  . Esomeprazole Magnesium (NEXIUM PO) Take by mouth.  . metoprolol succinate (TOPROL XL) 25 MG 24 hr tablet Take 1 tablet (25 mg total) by mouth daily. (Patient taking differently: Take 25 mg by mouth as needed. )  . Multiple Vitamins-Minerals (ALIVE WOMENS GUMMY PO) Take 2 each by mouth daily. Chewables   No facility-administered encounter medications on file as of 12/06/2018.    Allergies  Allergen Reactions  . Clindamycin/Lincomycin Rash   Patient Active Problem List   Diagnosis Date Noted  . SVT (supraventricular tachycardia) ([place]) 11/16/2018  . S/P vaginal hysterectomy 10/17/2018  . Chest pain 06/30/2018  . Hyperglycemia 06/30/2018  . 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 Anemia 11/04/2016  . Anxiety 11/04/2016   Social History   Socioeconomic History  . Marital status: Single    Spouse name: Not on file  . Number of children: 4  . Years of education: Not on file  . Highest education level: Not on file  Occupational History  . Not on file  Social Needs  . Financial resource strain: Not on file  . Food insecurity    Worry: Not on file    Inability: Not on file  . Transportation needs    Medical: Not on file  Non-medical: Not on file  Tobacco Use  . Smoking status: Former Smoker    Packs/day: 1.00    Years: 33.00    Pack years: 33.00    Types: Cigarettes    Quit date: 04/02/2008    Years since quitting: 10.6  . Smokeless tobacco: Never Used  Substance and Sexual Activity  . Alcohol use: No  . Drug use: No  . Sexual activity: Not Currently    Birth control/protection: Surgical  Lifestyle  . Physical activity    Days per week: Not on file    Minutes per session: Not on file  . Stress: Not on file  Relationships  . Social  Herbalist on phone: Not on file    Gets together: Not on file    Attends religious service: Not on file    Active member of club or organization: Not on file    Attends meetings of clubs or organizations: Not on file    Relationship status: Not on file  . Intimate partner violence    Fear of current or ex partner: Not on file    Emotionally abused: Not on file    Physically abused: Not on file    Forced sexual activity: Not on file  Other Topics Concern  . Not on file  Social History Narrative  . Not on file    [name]'s family history includes COPD in her father; Diabetes in her maternal grandmother; Heart disease in her mother; Hypertension in her maternal aunt and mother; Kidney disease in her sister.      Objective:    Vitals:   12/06/18 1036  BP: 120/80  Pulse: 92  Temp: 98.1 F (36.7 C)    Physical Exam Well-developed well-nourished white female, obese in no acute distress.  Uncomfortable appearing and holding her abdomen, accompanied by her brother height, Weight, 293 BMI 46.5  HEENT; nontraumatic normocephalic, EOMI, PER R LA, sclera anicteric. Oropharynx; not examined/mask/Covid Neck; supple, no JVD Cardiovascular; regular rate and rhythm with S1-S2, no murmur rub or gallop Pulmonary; Clear bilaterally Abdomen; soft, morbidly obeseno palpable mass or hepatosplenomegaly, bowel sounds are active, she is quite tender in the left lower quadrant and left mid quadrant and also has some tenderness in the deep right lower quadrant, no rebound. Rectal; not done Skin; benign exam, no jaundice rash or appreciable lesions Extremities; no clubbing cyanosis or edema skin warm and dry Neuro/Psych; alert and oriented x4, grossly nonfocal mood and affect appropriate       Assessment & Plan:   #[age] year old white female status post total vaginal hysterectomy 10/17/2018 with onset of lower abdominal pain on 11/16/2018 which has become progressively worse and now  severe over the past week.  ER evaluation about 2 weeks ago with CT scan that did not show any definite postoperative complication abscess etc.  However patient's pain has definitely worsened, and she has developed chills over the past couple of days. Etiology of pain is not clear, do not think this pain is just related to adhesions, still concerned about postoperative complication from total vaginal hysterectomy, rule out other acute intra-abdominal inflammatory process.  #2 GERD #3.  Morbid obesity BMI 46 #4.  Sleep apnea #5.  History of SVT status post ablation 11/16/2018. #6.  Mild transaminitis-likely secondary to fatty liver.  Plan; repeat CBC with differential today, sed rate, be met. Schedule for CT scan of the abdomen and pelvis with IV and oral contrast. Further recommendations pending results of  above. Patient will be established with Dr. Tarri Glenn.  Ariabella Brien S Fabion Gatson PA-C 12/06/2018   Cc: No ref. provider found

## 2018-12-07 ENCOUNTER — Telehealth: Payer: Self-pay | Admitting: Physician Assistant

## 2018-12-07 ENCOUNTER — Other Ambulatory Visit: Payer: Self-pay

## 2018-12-07 MED ORDER — METRONIDAZOLE 500 MG PO TABS: 500.0000 mg | ORAL_TABLET | Freq: Two times a day (BID) | ORAL | 0 refills | Status: AC

## 2018-12-07 MED ORDER — TRAMADOL HCL 50 MG PO TABS: 50.0000 mg | ORAL_TABLET | Freq: Four times a day (QID) | ORAL | 0 refills | Status: DC | PRN

## 2018-12-07 MED ORDER — CIPROFLOXACIN HCL 500 MG PO TABS: 500.0000 mg | ORAL_TABLET | Freq: Two times a day (BID) | ORAL | 0 refills | Status: AC

## 2018-12-07 NOTE — Telephone Encounter (Signed)
Discussed CT results. See imaging note

## 2018-12-07 NOTE — Telephone Encounter (Signed)
Patient is scheduled for CPAP Titration on 12/18/18. Donna Long is scheduled for COVID screening on 12/14/18 1pm prior to titration.  Patient understands his titration study will be done at [facility] sleep [facility]. Patient understands he will receive a letter in a week or so detailing appointment, date, time, and location. Patient understands to call if he does not receive the letter  in a timely manner. Patient agrees with treatment and thanked me for call.

## 2018-12-09 NOTE — Progress Notes (Signed)
Reviewed. I agree with documentation including the assessment and plan.  Jamia Hoban L. Ralynn San, MD, MPH 

## 2018-12-10 ENCOUNTER — Other Ambulatory Visit (HOSPITAL_BASED_OUTPATIENT_CLINIC_OR_DEPARTMENT_OTHER): Payer: Self-pay

## 2018-12-14 ENCOUNTER — Other Ambulatory Visit (HOSPITAL_COMMUNITY)
Admission: RE | Admit: 2018-12-14 | Discharge: 2018-12-14 | Disposition: A | Discharge status: Home / Self Care | Payer: Self-pay | Source: Ambulatory Visit | Attending: Cardiology | Admitting: Cardiology

## 2018-12-14 ENCOUNTER — Other Ambulatory Visit: Payer: Self-pay

## 2018-12-14 DIAGNOSIS — Z01812 Encounter for preprocedural laboratory examination: Secondary | ICD-10-CM | POA: Insufficient documentation

## 2018-12-14 DIAGNOSIS — Z20828 Contact with and (suspected) exposure to other viral communicable diseases: Secondary | ICD-10-CM | POA: Insufficient documentation

## 2018-12-14 LAB — SARS CORONAVIRUS 2 (TAT 6-24 HRS): SARS Coronavirus 2: NEGATIVE

## 2018-12-17 ENCOUNTER — Encounter: Payer: Self-pay | Admitting: Obstetrics & Gynecology

## 2018-12-17 ENCOUNTER — Ambulatory Visit (INDEPENDENT_AMBULATORY_CARE_PROVIDER_SITE_OTHER): Payer: Self-pay | Admitting: Obstetrics & Gynecology

## 2018-12-17 ENCOUNTER — Other Ambulatory Visit: Payer: Self-pay

## 2018-12-17 ENCOUNTER — Telehealth: Payer: Self-pay | Admitting: Physician Assistant

## 2018-12-17 VITALS — BP 168/94 | HR 75 | Ht 66.5 in | Wt 294.0 lb

## 2018-12-17 DIAGNOSIS — K5792 Diverticulitis of intestine, part unspecified, without perforation or abscess without bleeding: Secondary | ICD-10-CM

## 2018-12-17 DIAGNOSIS — K562 Volvulus: Secondary | ICD-10-CM

## 2018-12-17 NOTE — Telephone Encounter (Signed)
Pt requested a call back to discuss abdominal pain.  She stated that PCP recommended a colonoscopy ASAP.

## 2018-12-17 NOTE — Progress Notes (Signed)
Preoperative History and Physical  Donna Long is a 48 y.o. G4P4 with Patient's last menstrual period was 07/05/2017 (approximate). admitted for a laparoscopci BSO with removal of epiplocial fat that has undergone torsion according to CT scan, indicated procedures.  May require laparotomy due to central obesity  Dr Arnoldo Morale and Constance Haw want a colonoscopy pre op to evaluate the lumen and extent of diverticular disease, request [facility] do colonoscopy  PMH:    Past Medical History:  Diagnosis Date  . Anemia    d/t gynecological issues  . Anxiety   . Arthritis   . Back injury   . Chronic back pain   . Depression   . Dysrhythmia    HX SVT  . Fatty liver   . GERD (gastroesophageal reflux disease)   . Hypertension   . SVT (supraventricular tachycardia) ([facility])     PSH:     Past Surgical History:  Procedure Laterality Date  . DILITATION & CURRETTAGE/HYSTROSCOPY WITH NOVASURE ABLATION N/A 08/16/2017   Procedure: DILATATION & CURETTAGE/HYSTEROSCOPY WITH MINERVA  ENDOMETRIAL ABLATION;  Surgeon: Florian Buff, MD;  Location: [facility];  Service: Gynecology;  Laterality: N/A;  . SVT ABLATION N/A 11/16/2018   Procedure: SVT ABLATION;  Surgeon: Evans Lance, MD;  Location: [facility];  Service: Cardiovascular;  Laterality: N/A;  . TUBAL LIGATION    . VAGINAL HYSTERECTOMY N/A 10/17/2018   Procedure: HYSTERECTOMY VAGINAL;  Surgeon: Florian Buff, MD;  Location: [facility];  Service: Gynecology;  Laterality: N/A;  . WRIST SURGERY Right     POb/GynH:      OB History    Gravida  4   Para  4   Term      Preterm      AB      Living  4     SAB      TAB      Ectopic      Multiple      Live Births              SH:   Social History   Tobacco Use  . Smoking status: Former Smoker    Packs/day: 1.00    Years: 33.00    Pack years: 33.00    Types: Cigarettes    Quit date: 04/02/2008    Years since quitting: 10.7  . Smokeless tobacco: Never Used  Substance Use Topics   . Alcohol use: No  . Drug use: No    FH:    Family History  Problem Relation Age of Onset  . Hypertension Mother   . Heart disease Mother   . COPD Father   . Hypertension Maternal Aunt   . Diabetes Maternal Grandmother   . Kidney disease Sister      Allergies:  Allergies  Allergen Reactions  . Clindamycin/Lincomycin Rash    Medications:       Current Outpatient Medications:  .  ciprofloxacin (CIPRO) 500 MG tablet, Take 1 tablet (500 mg total) by mouth 2 (two) times daily for 14 days., Disp: 28 tablet, Rfl: 0 .  esomeprazole (NEXIUM) 20 MG packet, Take 20 mg by mouth daily before breakfast. OTC, Disp: , Rfl:  .  metoprolol succinate (TOPROL XL) 25 MG 24 hr tablet, Take 1 tablet (25 mg total) by mouth daily. (Patient taking differently: Take 25 mg by mouth as needed. ), Disp: 90 tablet, Rfl: 3 .  metroNIDAZOLE (FLAGYL) 500 MG tablet, Take 1 tablet (500 mg total) by mouth 2 (  two) times daily for 14 days., Disp: 28 tablet, Rfl: 0 .  Multiple Vitamins-Minerals (ALIVE WOMENS GUMMY PO), Take 2 each by mouth daily. Chewables, Disp: , Rfl:   Review of Systems:   Review of Systems  Constitutional: Negative for fever, chills, weight loss, malaise/fatigue and diaphoresis.  HENT: Negative for hearing loss, ear pain, nosebleeds, congestion, sore throat, neck pain, tinnitus and ear discharge.   Eyes: Negative for blurred vision, double vision, photophobia, pain, discharge and redness.  Respiratory: Negative for cough, hemoptysis, sputum production, shortness of breath, wheezing and stridor.   Cardiovascular: Negative for chest pain, palpitations, orthopnea, claudication, leg swelling and PND.  Gastrointestinal: Positive for abdominal pain. Negative for heartburn, nausea, vomiting, diarrhea, constipation, blood in stool and melena.  Genitourinary: Negative for dysuria, urgency, frequency, hematuria and flank pain.  Musculoskeletal: Negative for myalgias, back pain, joint pain and falls.   Skin: Negative for itching and rash.  Neurological: Negative for dizziness, tingling, tremors, sensory change, speech change, focal weakness, seizures, loss of consciousness, weakness and headaches.  Endo/Heme/Allergies: Negative for environmental allergies and polydipsia. Does not bruise/bleed easily.  Psychiatric/Behavioral: Negative for depression, suicidal ideas, hallucinations, memory loss and substance abuse. The patient is not nervous/anxious and does not have insomnia.      PHYSICAL EXAM:  Blood pressure (!) 168/94, pulse 75, height 5' 6.5" (1.689 m), weight 294 lb (133.4 kg), last menstrual period 07/05/2017.    Vitals reviewed. Constitutional: She is oriented to person, place, and time. She appears well-developed and well-nourished.  HENT:  Head: Normocephalic and atraumatic.  Right Ear: External ear normal.  Left Ear: External ear normal.  Nose: Nose normal.  Mouth/Throat: Oropharynx is clear and moist.  Eyes: Conjunctivae and EOM are normal. Pupils are equal, round, and reactive to light. Right eye exhibits no discharge. Left eye exhibits no discharge. No scleral icterus.  Neck: Normal range of motion. Neck supple. No tracheal deviation present. No thyromegaly present.  Cardiovascular: Normal rate, regular rhythm, normal heart sounds and intact distal pulses.  Exam reveals no gallop and no friction rub.   No murmur heard. Respiratory: Effort normal and breath sounds normal. No respiratory distress. She has no wheezes. She has no rales. She exhibits no tenderness.  GI: Soft. Bowel sounds are normal. She exhibits no distension and no mass. There is tenderness. There is no rebound and no guarding.  Genitourinary:       Vulva is normal without lesions Vagina is pink moist without discharge Cervix absent Uterus is uterus absent Adnexa is negative with normal sized ovaries by sonogram  Musculoskeletal: Normal range of motion. She exhibits no edema and no tenderness.   Neurological: She is alert and oriented to person, place, and time. She has normal reflexes. She displays normal reflexes. No cranial nerve deficit. She exhibits normal muscle tone. Coordination normal.  Skin: Skin is warm and dry. No rash noted. No erythema. No pallor.  Psychiatric: She has a normal mood and affect. Her behavior is normal. Judgment and thought content normal.    Labs: Results for orders placed or performed during the hospital encounter of 12/14/18 (from the past 336 hour(s))  SARS CORONAVIRUS 2 (TAT 6-24 HRS) Nasopharyngeal Nasopharyngeal Swab   Collection Time: 12/14/18  7:43 AM   Specimen: Nasopharyngeal Swab  Result Value Ref Range   SARS Coronavirus 2 NEGATIVE NEGATIVE  Results for orders placed or performed in visit on 12/06/18 (from the past 336 hour(s))  Sed Rate (ESR)   Collection Time: 12/06/18 11:32 AM  Result Value Ref Range   Sed Rate 46 (H) 0 - 20 mm/hr  Basic metabolic panel   Collection Time: 12/06/18 11:32 AM  Result Value Ref Range   Sodium 137 135 - 145 mEq/L   Potassium 3.9 3.5 - 5.1 mEq/L   Chloride 102 96 - 112 mEq/L   CO2 28 19 - 32 mEq/L   Glucose, Bld 137 (H) 70 - 99 mg/dL   BUN 7 6 - 23 mg/dL   Creatinine, Ser 0.69 0.40 - 1.20 mg/dL   Calcium 9.5 8.4 - 10.5 mg/dL   GFR 90.54 >60.00 mL/min  CBC w/Diff   Collection Time: 12/06/18 11:32 AM  Result Value Ref Range   WBC 11.5 (H) 4.0 - 10.5 K/uL   RBC 5.08 3.87 - 5.11 Mil/uL   Hemoglobin 12.7 12.0 - 15.0 g/dL   HCT 39.7 36.0 - 46.0 %   MCV 78.1 78.0 - 100.0 fl   MCHC 32.1 30.0 - 36.0 g/dL   RDW 22.5 (H) 11.5 - 15.5 %   Platelets 346.0 150.0 - 400.0 K/uL   Neutrophils Relative % 64.9 43.0 - 77.0 %   Lymphocytes Relative 26.6 12.0 - 46.0 %   Monocytes Relative 5.1 3.0 - 12.0 %   Eosinophils Relative 2.4 0.0 - 5.0 %   Basophils Relative 1.0 0.0 - 3.0 %   Neutro Abs 7.5 1.4 - 7.7 K/uL   Lymphs Abs 3.1 0.7 - 4.0 K/uL   Monocytes Absolute 0.6 0.1 - 1.0 K/uL   Eosinophils Absolute 0.3  0.0 - 0.7 K/uL   Basophils Absolute 0.1 0.0 - 0.1 K/uL    EKG: Orders placed or performed during the hospital encounter of 10/12/18  . EKG 12-Lead  . EKG 12-Lead    Imaging Studies: Ct Abdomen Pelvis W Contrast  Result Date: 12/06/2018 CLINICAL DATA:  Left lower quadrant pain EXAM: CT ABDOMEN AND PELVIS WITH CONTRAST TECHNIQUE: Multidetector CT imaging of the abdomen and pelvis was performed using the standard protocol following bolus administration of intravenous contrast. CONTRAST:  167m OMNIPAQUE IOHEXOL 300 MG/ML  SOLN COMPARISON:  11/18/2018 FINDINGS: Lower chest: No acute abnormality. Hepatobiliary: Hepatic steatosis. No focal liver abnormality is seen. No gallstones, gallbladder wall thickening, or biliary dilatation. Pancreas: Unremarkable. No pancreatic ductal dilatation or surrounding inflammatory changes. Spleen: Normal in size without focal abnormality. Adrenals/Urinary Tract: Normal appearance of the adrenal glands. Left upper pole striated nephrogram is identified, image 81/6. No hydronephrosis identified. Normal appearance of the right kidney. Urinary bladder is unremarkable. Stomach/Bowel: Stomach is within normal limits. Appendix appears normal. No evidence of bowel wall thickening, distention, or inflammatory changes. Scattered distal colonic diverticulosis. Inflammatory changes are identified within the left side of pelvis involving the sigmoid colon and left ovary. This appears increased compared with the previous examination. No discrete drainable fluid collection identified. Vascular/Lymphatic: No significant vascular findings are present. No enlarged abdominal or pelvic lymph nodes. Reproductive: Status post hysterectomy. There is asymmetric enlargement of the left ovary with surrounding inflammatory changes. The appearance has increased from previous exam. Other: No free fluid or fluid collections. Musculoskeletal: No acute or significant osseous findings. IMPRESSION: 1.  Persistent and progressive inflammatory changes within the left pelvis involving the sigmoid colon and left ovary. No discrete fluid collection identified. The primary differential considerations include diverticulitis versus torsed epiploic appendage. Postoperative infection of the left ovary is not excluded. 2. New left upper pole striated nephrogram suggestive of acute pyelonephritis. Electronically Signed   By: TKerby MoorsM.D.   On:  12/06/2018 16:38   Ct Abdomen Pelvis W Contrast  Result Date: 11/18/2018 CLINICAL DATA:  Recent vaginal hysterectomy. Acute pelvic discomfort. Worsening pain. Worsening pain with sitting and bowel movement. EXAM: CT ABDOMEN AND PELVIS WITH CONTRAST TECHNIQUE: Multidetector CT imaging of the abdomen and pelvis was performed using the standard protocol following bolus administration of intravenous contrast. CONTRAST:  147m OMNIPAQUE IOHEXOL 300 MG/ML  SOLN COMPARISON:  CT Jun 16, 2018 FINDINGS: Lower chest: Lung bases are clear. Hepatobiliary: No focal hepatic lesion. No biliary duct dilatation. Gallbladder is normal. Common bile duct is normal. Pancreas: Pancreas is normal. No ductal dilatation. No pancreatic inflammation. Spleen: Normal spleen Adrenals/urinary tract: Adrenal glands and kidneys are normal. The ureters and bladder normal. Stomach/Bowel: Stomach, small-bowel appendix and cecum normal.: And rectosigmoid colon normal. Vascular/Lymphatic: Abdominal aorta is normal caliber. No periportal or retroperitoneal adenopathy. No pelvic adenopathy. Reproductive: Post hysterectomy. No fluid collections at the vaginal cuff. Ovaries are normal. No fluid collection within the pelvis identified. No clear inflammation. Mild haziness in the broad ligaments. Delayed imaging through the pelvis demonstrates normal ureters and bladder. Perineum grossly normal by CT. Other: No free fluid. Musculoskeletal: No aggressive osseous lesion. IMPRESSION: 1. No obvious complication following  hysterectomy. Mild haziness in the broad ligaments following surgery. No fluid collections within the pelvis. Vaginal cuff appears normal. Ovaries normal. 2. Delayed imaging through the pelvis demonstrates normal ureters. Normal bladder. Electronically Signed   By: SSuzy BouchardM.D.   On: 11/18/2018 05:09      Assessment: Patient Active Problem List   Diagnosis Date Noted  . [place] 11[place]  . S/P vaginal hysterectomy 10/17/2018  . Chest pain 06/30/2018  . Hyperglycemia 06/30/2018  . 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 Anemia 11/04/2016  . Anxiety 11/04/2016    Plan: Laparoscopic bilateral salpingo oophorectomy with removal of epiploical fat that has torsion indicated procedures, which may include laparotomy due to central obesity  Discussed with Drs JArnoldo Moraleand BConstance Hawwho feel due to CT scan showing diverticulitis would be best served with a pre op colonoscopy to rule out luminal stricture and comlications otherwise not seen on CT scan.    She sees  GI on 12/20/2018 and I will send note to them.  After colonoscopy will schedule as above  LFlorian Buff11/10/2018 10:31 AM

## 2018-12-17 NOTE — Telephone Encounter (Signed)
Do you want her scheduled for a colonoscopy or a repeat office visit? Thanks

## 2018-12-17 NOTE — Telephone Encounter (Signed)
She may have colonoscopy or office visit, patient choice. Thanks.

## 2018-12-18 NOTE — Telephone Encounter (Signed)
Spoke with the patient. She wants to have the colonoscopy. Pre-visit 12/20/18 at 9:00 Covid testing after Pre-visit 9:45 Colon 12/25/18 at 3:00

## 2018-12-18 NOTE — Telephone Encounter (Signed)
Patient is on 100% financial aide, not sure if it will pay for her cpap device. Patient will call to financial assistance to clarify.

## 2018-12-20 ENCOUNTER — Ambulatory Visit (AMBULATORY_SURGERY_CENTER): Payer: Self-pay

## 2018-12-20 ENCOUNTER — Ambulatory Visit: Payer: Self-pay | Admitting: Physician Assistant

## 2018-12-20 ENCOUNTER — Other Ambulatory Visit (HOSPITAL_COMMUNITY)
Admission: RE | Admit: 2018-12-20 | Discharge: 2018-12-20 | Disposition: A | Discharge status: Home / Self Care | Payer: Self-pay | Source: Ambulatory Visit | Attending: Gastroenterology | Admitting: Gastroenterology

## 2018-12-20 ENCOUNTER — Other Ambulatory Visit: Payer: Self-pay

## 2018-12-20 VITALS — Temp 96.6°F | Ht 66.0 in | Wt 294.2 lb

## 2018-12-20 DIAGNOSIS — Z20828 Contact with and (suspected) exposure to other viral communicable diseases: Secondary | ICD-10-CM | POA: Insufficient documentation

## 2018-12-20 DIAGNOSIS — R1032 Left lower quadrant pain: Secondary | ICD-10-CM

## 2018-12-20 LAB — SARS CORONAVIRUS 2 (TAT 6-24 HRS): SARS Coronavirus 2: NEGATIVE

## 2018-12-20 MED ORDER — NA SULFATE-K SULFATE-MG SULF 17.5-3.13-1.6 GM/177ML PO SOLN: 1.0000 | Freq: Once | ORAL | 0 refills | Status: AC

## 2018-12-20 NOTE — Progress Notes (Signed)
Denies allergies to eggs or soy products. Denies complication of anesthesia or sedation. Denies use of weight loss medication. Denies use of O2.   Emmi instructions given for colonoscopy.  Covid screening [facility] 12/20/18 @ 11:20 Am. A sample of Suprep was given to the patient.

## 2018-12-25 ENCOUNTER — Other Ambulatory Visit: Payer: Self-pay

## 2018-12-25 ENCOUNTER — Encounter: Payer: Self-pay | Admitting: Gastroenterology

## 2018-12-25 ENCOUNTER — Encounter: Payer: Self-pay | Admitting: *Deleted

## 2018-12-25 ENCOUNTER — Ambulatory Visit (AMBULATORY_SURGERY_CENTER): Payer: Self-pay | Admitting: Gastroenterology

## 2018-12-25 ENCOUNTER — Telehealth: Payer: Self-pay | Admitting: *Deleted

## 2018-12-25 VITALS — BP 139/81 | HR 54 | Temp 98.0°F | Resp 11 | Ht 66.0 in | Wt 294.0 lb

## 2018-12-25 DIAGNOSIS — R1032 Left lower quadrant pain: Secondary | ICD-10-CM

## 2018-12-25 DIAGNOSIS — K5730 Diverticulosis of large intestine without perforation or abscess without bleeding: Secondary | ICD-10-CM

## 2018-12-25 DIAGNOSIS — K648 Other hemorrhoids: Secondary | ICD-10-CM

## 2018-12-25 MED ORDER — SODIUM CHLORIDE 0.9 % IV SOLN: 500.0000 mL | Freq: Once | INTRAVENOUS | Status: DC

## 2018-12-25 NOTE — Op Note (Signed)
[facility] Patient Name: Donna Long Procedure Date: 12/25/2018 9:09 AM MRN: [id number] Endoscopist: Tressia Danas MD, MD Age: 48 Referring MD:  Date of Birth: 1970/08/15 Gender: Female Account #: [id number] Procedure:                Colonoscopy Indications:              Abdominal pain - lower Medicines:                Monitored Anesthesia Care Procedure:                Pre-Anesthesia Assessment:                           - Prior to the procedure, a History and Physical                            was performed, and patient medications and                            allergies were reviewed. The patient's tolerance of                            previous anesthesia was also reviewed. The risks                            and benefits of the procedure and the sedation                            options and risks were discussed with the patient.                            All questions were answered, and informed consent                            was obtained. Prior Anticoagulants: The patient has                            taken no previous anticoagulant or antiplatelet                            agents. ASA Grade Assessment: III - A patient with                            severe systemic disease. After reviewing the risks                            and benefits, the patient was deemed in                            satisfactory condition to undergo the procedure.                           After obtaining informed consent, the colonoscope  was passed under direct vision. Throughout the                            procedure, the patient's blood pressure, pulse, and                            oxygen saturations were monitored continuously. The                            Colonoscope was introduced through the anus and                            advanced to the the terminal ileum, with                            identification of the appendiceal orifice  and IC                            valve. A second forward view of the right colon was                            performed. The colonoscopy was technically                            difficult and complex due to restricted mobility of                            the colon and a redundant colon. Successful                            completion of the procedure was aided by applying                            abdominal pressure. The patient tolerated the                            procedure well. The quality of the bowel                            preparation was excellent. The terminal ileum,                            ileocecal valve, appendiceal orifice, and rectum                            were photographed. Scope In: 9:20:05 AM Scope Out: 9:34:07 AM Scope Withdrawal Time: 0 hours 8 minutes 12 seconds  Total Procedure Duration: 0 hours 14 minutes 2 seconds  Findings:                 The perianal and digital rectal examinations were                            normal.  A few small-mouthed diverticula were found in the                            sigmoid colon.                           Non-bleeding internal hemorrhoids were found.                           The exam was otherwise without abnormality on                            direct and retroflexion views. Complications:            No immediate complications. Estimated Blood Loss:     Estimated blood loss: none. Impression:               - Diverticulosis in the sigmoid colon.                           - Non-bleeding internal hemorrhoids.                           - The examination was otherwise normal on direct                            and retroflexion views.                           - No specimens collected.                           - The source of lower abdominal pain was not                            identified on this study. Recommendation:           - Patient has a contact number available for                             emergencies. The signs and symptoms of potential                            delayed complications were discussed with the                            patient. Return to normal activities tomorrow.                            Written discharge instructions were provided to the                            patient.                           - High fiber diet.                           -  Continue present medications.                           - Repeat colonoscopy in 10 years for screening                            purposes. Thornton Park MD, MD 12/25/2018 9:43:25 AM This report has been signed electronically.

## 2018-12-25 NOTE — Telephone Encounter (Signed)
Call her and tell her I am talking with Drs Arnoldo Morale and Constance Haw tomorrow about surgery timing

## 2018-12-25 NOTE — Telephone Encounter (Signed)
Patient called to inform Dr Elonda Husky that colonoscopy is complete.  She is unable to send a Donna Long.

## 2018-12-25 NOTE — Progress Notes (Signed)
To PACU, VSS. Report to [email or web address] 

## 2018-12-25 NOTE — Patient Instructions (Signed)
YOU HAD AN ENDOSCOPIC PROCEDURE TODAY AT [facility]:   Refer to the procedure report that was given to you for any specific questions about what was found during the examination.  If the procedure report does not answer your questions, please call your gastroenterologist to clarify.  If you requested that your care partner not be given the details of your procedure findings, then the procedure report has been included in a sealed envelope for you to review at your convenience later.  **Handouts given on Diverticulosis, hemorrhoids, and High fiber diet**  YOU SHOULD EXPECT: Some feelings of bloating in the abdomen. Passage of more gas than usual.  Walking can help get rid of the air that was put into your GI tract during the procedure and reduce the bloating. If you had a lower endoscopy (such as a colonoscopy or flexible sigmoidoscopy) you may notice spotting of blood in your stool or on the toilet paper. If you underwent a bowel prep for your procedure, you may not have a normal bowel movement for a few days.  Please Note:  You might notice some irritation and congestion in your nose or some drainage.  This is from the oxygen used during your procedure.  There is no need for concern and it should clear up in a day or so.  SYMPTOMS TO REPORT IMMEDIATELY:   Following lower endoscopy (colonoscopy or flexible sigmoidoscopy):  Excessive amounts of blood in the stool  Significant tenderness or worsening of abdominal pains  Swelling of the abdomen that is new, acute  Fever of 100F or higher   For urgent or emergent issues, a gastroenterologist can be reached at any hour by calling [phone].   DIET:  We do recommend a small meal at first, but then you may proceed to your regular diet.  Drink plenty of fluids but you should avoid alcoholic beverages for 24 hours.  ACTIVITY:  You should plan to take it easy for the rest of today and you should NOT DRIVE or use heavy machinery  until tomorrow (because of the sedation medicines used during the test).    FOLLOW UP: Our staff will call the number listed on your records 48-72 hours following your procedure to check on you and address any questions or concerns that you may have regarding the information given to you following your procedure. If we do not reach you, we will leave a message.  We will attempt to reach you two times.  During this call, we will ask if you have developed any symptoms of COVID 19. If you develop any symptoms (ie: fever, flu-like symptoms, shortness of breath, cough etc.) before then, please call [phone].  If you test positive for Covid 19 in the 2 weeks post procedure, please call and report this information to [place].    If any biopsies were taken you will be contacted by phone or by letter within the next 1-3 weeks.  Please call us at [phone] if you have not heard about the biopsies in 3 weeks.    SIGNATURES/CONFIDENTIALITY: You and/or your care partner have signed paperwork which will be entered into your electronic medical record.  These signatures attest to the fact that that the information above on your After Visit Summary has been reviewed and is understood.  Full responsibility of the confidentiality of this discharge information lies with you and/or your care-partner.

## 2018-12-25 NOTE — Progress Notes (Signed)
Temp check by:JB Vital check by:CW  The patient states no changes in medical or surgical history since pre-visit screening on 12/20/2018.

## 2018-12-27 ENCOUNTER — Other Ambulatory Visit: Payer: Self-pay | Admitting: *Deleted

## 2018-12-27 ENCOUNTER — Telehealth: Payer: Self-pay

## 2018-12-27 DIAGNOSIS — K5792 Diverticulitis of intestine, part unspecified, without perforation or abscess without bleeding: Secondary | ICD-10-CM

## 2018-12-27 NOTE — Telephone Encounter (Signed)
  Follow up Call-  Call back number 12/25/2018  Post procedure Call Back phone  # [phone]  Permission to leave phone message Yes  Some recent data might be hidden     Patient questions:  Do you have a fever, pain , or abdominal swelling? No. Pain Score  0 *  Have you tolerated food without any problems? Yes.    Have you been able to return to your normal activities? Yes.    Do you have any questions about your discharge instructions: Diet   No. Medications  No. Follow up visit  No.  Do you have questions or concerns about your Care? No.  Actions: * If pain score is 4 or above: No action needed, pain <4.  1. Have you developed a fever since your procedure? no  2.   Have you had an respiratory symptoms (SOB or cough) since your procedure? no  3.   Have you tested positive for COVID 19 since your procedure no  4.   Have you had any family members/close contacts diagnosed with the COVID 19 since your procedure?  no   If yes to any of these questions please route to Joylene John, RN and Alphonsa Gin, Therapist, sports.

## 2019-01-08 ENCOUNTER — Other Ambulatory Visit: Payer: Self-pay

## 2019-01-08 ENCOUNTER — Ambulatory Visit (INDEPENDENT_AMBULATORY_CARE_PROVIDER_SITE_OTHER): Payer: Self-pay | Admitting: General Surgery

## 2019-01-08 ENCOUNTER — Encounter: Payer: Self-pay | Admitting: General Surgery

## 2019-01-08 VITALS — BP 136/89 | HR 79 | Temp 98.1°F | Resp 18 | Ht 66.5 in | Wt 289.0 lb

## 2019-01-08 DIAGNOSIS — K5730 Diverticulosis of large intestine without perforation or abscess without bleeding: Secondary | ICD-10-CM

## 2019-01-08 NOTE — Progress Notes (Signed)
Donna Long; [id number]; 1970-12-30   HPI Patient is a 48 year old white female who was referred to my care by Dr. Elonda Husky of gynecology for evaluation and treatment of sigmoid diverticulitis.  Patient was seen by him for pelvic pain.  During the work-up for the etiology of the left-sided pelvic pain, she was found to have sigmoid diverticulitis with a possible epiploic appendage inflammation.  She was started on antibiotics and her pain has mostly resolved.  She did have a colonoscopy which did reveal sigmoid diverticulosis.  No other pathology was noted.  She currently rates her abdominal pain as a 2 out of 10.  This is her first episode of left-sided abdominal pain.  Dr. Elonda Husky would like to proceed with bilateral nephrectomy with general surgical consultation. Past Medical History:  Diagnosis Date  . Anemia    d/t gynecological issues  . Anxiety   . Arthritis   . Back injury   . Blood transfusion without reported diagnosis   . Chronic back pain   . Depression   . Dysrhythmia    HX SVT  . Fatty liver   . GERD (gastroesophageal reflux disease)   . Hypertension   . Sleep apnea   . SVT (supraventricular tachycardia) (HCC)     Past Surgical History:  Procedure Laterality Date  . DILITATION & CURRETTAGE/HYSTROSCOPY WITH NOVASURE ABLATION N/A 08/16/2017   Procedure: DILATATION & CURETTAGE/HYSTEROSCOPY WITH MINERVA  ENDOMETRIAL ABLATION;  Surgeon: Florian Buff, MD;  Location: [facility];  Service: Gynecology;  Laterality: N/A;  . SVT ABLATION N/A 11/16/2018   Procedure: SVT ABLATION;  Surgeon: Evans Lance, MD;  Location: [facility];  Service: Cardiovascular;  Laterality: N/A;  . TUBAL LIGATION    . VAGINAL HYSTERECTOMY N/A 10/17/2018   Procedure: HYSTERECTOMY VAGINAL;  Surgeon: Florian Buff, MD;  Location: [facility];  Service: Gynecology;  Laterality: N/A;  . WRIST SURGERY Right     Family History  Problem Relation Age of Onset  . Hypertension Mother   . Heart disease Mother   .  COPD Father   . Hypertension Maternal Aunt   . Diabetes Maternal Grandmother   . Kidney disease Sister   . Colon cancer Neg Hx   . Esophageal cancer Neg Hx   . Rectal cancer Neg Hx   . Stomach cancer Neg Hx     Current Outpatient Medications on File Prior to Visit  Medication Sig Dispense Refill  . esomeprazole (NEXIUM) 20 MG packet Take 20 mg by mouth daily before breakfast. OTC    . metoprolol succinate (TOPROL XL) 25 MG 24 hr tablet Take 1 tablet (25 mg total) by mouth daily. (Patient taking differently: Take 25 mg by mouth as needed. ) 90 tablet 3  . Multiple Vitamins-Minerals (ALIVE WOMENS GUMMY PO) Take 2 each by mouth daily. Chewables     No current facility-administered medications on file prior to visit.     Allergies  Allergen Reactions  . Clindamycin/Lincomycin Rash    Social History   Substance and Sexual Activity  Alcohol Use No    Social History   Tobacco Use  Smoking Status Former Smoker  . Packs/day: 1.00  . Years: 33.00  . Pack years: 33.00  . Types: Cigarettes  . Quit date: 04/02/2008  . Years since quitting: 10.7  Smokeless Tobacco Never Used    Review of Systems  Constitutional: Negative.   HENT: Positive for sinus pain.   Eyes: Positive for blurred vision.  Respiratory: Negative.  Cardiovascular: Negative.   Gastrointestinal: Positive for abdominal pain, heartburn and nausea.  Genitourinary: Negative.   Musculoskeletal: Positive for back pain, joint pain and neck pain.  Skin: Negative.   Neurological: Negative.   Endo/Heme/Allergies: Negative.   Psychiatric/Behavioral: Negative.     Objective   Vitals:   01/08/19 1433  BP: 136/89  Pulse: 79  Resp: 18  Temp: 98.1 F (36.7 C)  SpO2: 98%    Physical Exam Vitals signs reviewed.  Constitutional:      Appearance: Normal appearance. She is obese. She is not ill-appearing.  HENT:     Head: Normocephalic and atraumatic.  Cardiovascular:     Rate and Rhythm: Normal rate and  regular rhythm.     Heart sounds: Normal heart sounds. No murmur. No friction rub. No gallop.   Pulmonary:     Effort: Pulmonary effort is normal. No respiratory distress.     Breath sounds: Normal breath sounds. No stridor. No wheezing, rhonchi or rales.  Abdominal:     General: Bowel sounds are normal. There is no distension.     Palpations: Abdomen is soft. There is no mass.     Tenderness: There is no abdominal tenderness. There is no guarding or rebound.     Hernia: A hernia is present.     Comments: Minimal umbilical hernia present.  Skin:    General: Skin is warm and dry.  Neurological:     Mental Status: She is alert and oriented to person, place, and time.   CT scan report reviewed  Assessment  Sigmoid diverticulitis, resolving Epiploic inflammation Possible inflammation of the left ovary Plan   Given that this is her first episode of diverticulitis and it has resolved mostly with antibiotics, I would not recommend a partial colectomy at this time.  I will be available to visualize the sigmoid colon and appendages at the time of her laparoscopic nephrectomy by Dr. Despina Hidden.  Patient understands and agrees to the treatment plan.

## 2019-02-18 ENCOUNTER — Other Ambulatory Visit: Payer: Self-pay

## 2019-02-18 ENCOUNTER — Other Ambulatory Visit (HOSPITAL_COMMUNITY)
Admission: RE | Admit: 2019-02-18 | Discharge: 2019-02-18 | Disposition: A | Discharge status: Home / Self Care | Payer: HRSA Program | Source: Ambulatory Visit | Attending: Cardiology | Admitting: Cardiology

## 2019-02-18 ENCOUNTER — Other Ambulatory Visit (HOSPITAL_COMMUNITY): Payer: Self-pay

## 2019-02-18 DIAGNOSIS — Z20822 Contact with and (suspected) exposure to covid-19: Secondary | ICD-10-CM | POA: Diagnosis not present

## 2019-02-18 DIAGNOSIS — Z01812 Encounter for preprocedural laboratory examination: Secondary | ICD-10-CM | POA: Insufficient documentation

## 2019-02-18 LAB — SARS CORONAVIRUS 2 (TAT 6-24 HRS): SARS Coronavirus 2: NEGATIVE

## 2019-02-19 ENCOUNTER — Other Ambulatory Visit: Payer: Self-pay

## 2019-02-19 ENCOUNTER — Ambulatory Visit: Payer: Self-pay | Attending: Cardiology | Admitting: Cardiology

## 2019-02-19 DIAGNOSIS — G4733 Obstructive sleep apnea (adult) (pediatric): Secondary | ICD-10-CM | POA: Insufficient documentation

## 2019-02-19 DIAGNOSIS — G4730 Sleep apnea, unspecified: Secondary | ICD-10-CM

## 2019-02-20 NOTE — Procedures (Signed)
   Patient Name: Donna Long, Donna Long Date: 02/19/2019 Gender: Female D.O.B: Jun 08, 1970 Age (years): 38 Referring Provider: Armanda Magic MD, ABSM Height (inches): 67 Interpreting Physician: Armanda Magic MD, ABSM Weight (lbs): 280 RPSGT: Alfonso Ellis BMI: 45 MRN: [id number] Neck Size: 16.50  CLINICAL INFORMATION The patient is referred for a CPAP titration to treat sleep apnea.  SLEEP STUDY TECHNIQUE As per the AASM Manual for the Scoring of Sleep and Associated Events v2.3 (April 2016) with a hypopnea requiring 4% desaturations.  The channels recorded and monitored were frontal, central and occipital EEG, electrooculogram (EOG), submentalis EMG (chin), nasal and oral airflow, thoracic and abdominal wall motion, anterior tibialis EMG, snore microphone, electrocardiogram, and pulse oximetry. Continuous positive airway pressure (CPAP) was initiated at the beginning of the study and titrated to treat sleep-disordered breathing.  MEDICATIONS Medications self-administered by patient taken the night of the study : N/A  TECHNICIAN COMMENTS Comments added by technician: CPAP therapy started at 4 cm of H20, added humidification. Patient tolerated CPAP therapy very well. Titration increased to an optimal pressure 6 cm of H20 due to snoring episodes at times. Patient became restless at times. PLMs were noticed at times Comments added by scorer: N/A  RESPIRATORY PARAMETERS Optimal PAP Pressure (cm): 6  HI at Optimal Pressure (/hr): 0.0 Overall Minimal O2 (%):91.0  upine % at Optimal Pressure (%):  0 Minimal O2 at Optimal Pressure (%): 91.0   SLEEP ARCHITECTURE The study was initiated at 9:46:27 PM and ended at 5:01:22 AM.  Sleep onset time was 18.7 minutes and the sleep efficiency was 79.6%. The total sleep time was 346.2 minutes.  The patient spent 4.9% of the night in stage N1 sleep, 53.6% in stage N2 sleep, 31.2% in stage N3 and 10.3% in REM.Stage REM latency was 156.5 minutes  Wake  after sleep onset was 70.0. Alpha intrusion was absent. Supine sleep was 0.00%.  CARDIAC DATA The 2 lead EKG demonstrated sinus rhythm. The mean heart rate was 60.5 beats per minute. Other EKG findings include: PVCs.  LEG MOVEMENT DATA The total Periodic Limb Movements of Sleep (PLMS) were 0. The PLMS index was 0.0. A PLMS index of <15 is considered normal in adults.  IMPRESSIONS - The optimal PAP pressure was 6 cm of water. - Central sleep apnea was not noted during this titration (CAI = 0.2/h). - Significant oxygen desaturations were not observed during this titration (min O2 = 91.0%). - The patient snored with soft snoring volume during this titration study. - 2-lead EKG demonstrated: PVCs - Clinically significant periodic limb movements were not noted during this study. Arousals associated with PLMs were rare.  DIAGNOSIS - Obstructive Sleep Apnea (327.23 [G47.33 ICD-10])  RECOMMENDATIONS - Trial of CPAP therapy on 6 cm H2O with a Medium size Resmed Full Face Mask AirFit F30 mask and heated humidification. - Avoid alcohol, sedatives and other CNS depressants that may worsen sleep apnea and disrupt normal sleep architecture. - Sleep hygiene should be reviewed to assess factors that may improve sleep quality. - Weight management and regular exercise should be initiated or continued. - Return to Sleep Center for re-evaluation after 10 weeks of therapy  [Electronically signed] 02/20/2019 03:42 PM  Armanda Magic MD, ABSM Diplomate, American Board of Sleep Medicine

## 2019-02-26 ENCOUNTER — Telehealth: Payer: Self-pay | Admitting: *Deleted

## 2019-02-26 NOTE — Telephone Encounter (Signed)
-----   Message from Quintella Reichert, MD sent at 02/20/2019  3:45 PM EST ----- Please let patient know that they had a successful PAP titration and let DME know that orders are in EPIC.  Please set up 10 week OV with me.

## 2019-02-26 NOTE — Telephone Encounter (Addendum)
Informed patient of sleep study results and patient understanding was verbalized. Patient understands her sleep study showed they had a successful PAP titration and let DME know that orders are in EPIC. Please set up 10 week OV with me.   Patient does not have insurance or a DME and she is going to try to buy her unit and supplies. She has financial aide but they will not purchase her unit for her.  Patient is going to try to call and get patient assistance through her social services and call back.  Patient wants her prescription sent to her home address.  Patient will try to purchase her cpap with the help of her family. She has requested we mail her prescription to her. It will be put in the mail today.

## 2019-03-10 ENCOUNTER — Other Ambulatory Visit: Payer: Self-pay

## 2019-03-10 ENCOUNTER — Emergency Department (HOSPITAL_COMMUNITY)
Admission: EM | Admit: 2019-03-10 | Discharge: 2019-03-10 | Disposition: A | Discharge status: Home / Self Care | Payer: Self-pay | Attending: Emergency Medicine | Admitting: Emergency Medicine

## 2019-03-10 ENCOUNTER — Encounter (HOSPITAL_COMMUNITY): Payer: Self-pay | Admitting: Emergency Medicine

## 2019-03-10 DIAGNOSIS — Z5321 Procedure and treatment not carried out due to patient leaving prior to being seen by health care provider: Secondary | ICD-10-CM | POA: Insufficient documentation

## 2019-03-10 DIAGNOSIS — R21 Rash and other nonspecific skin eruption: Secondary | ICD-10-CM | POA: Insufficient documentation

## 2019-03-10 NOTE — ED Triage Notes (Signed)
Rash for 5 days  "don't feel good.. can't stay awake"

## 2019-03-10 NOTE — ED Notes (Signed)
Awaiting eval  

## 2019-03-10 NOTE — ED Notes (Signed)
Dx sleep apnea- "no machine yet"   Complains of sleep disturbance  Rash x 5 days -primarily hands an forearms bilaterally Has tried cortizone cream but no benadryl Po nor cream  Does not meet eye contact, soft spoken Reports no physician

## 2019-03-10 NOTE — ED Notes (Signed)
Provider in room to evaluate  Pt is not in room

## 2019-03-12 ENCOUNTER — Other Ambulatory Visit: Payer: Self-pay

## 2019-03-12 ENCOUNTER — Encounter (HOSPITAL_COMMUNITY): Payer: Self-pay

## 2019-03-12 ENCOUNTER — Ambulatory Visit (HOSPITAL_COMMUNITY)
Admission: EM | Admit: 2019-03-12 | Discharge: 2019-03-12 | Disposition: A | Discharge status: Home / Self Care | Payer: Self-pay | Attending: Physician Assistant | Admitting: Physician Assistant

## 2019-03-12 DIAGNOSIS — L247 Irritant contact dermatitis due to plants, except food: Secondary | ICD-10-CM

## 2019-03-12 MED ORDER — PREDNISONE 10 MG (21) PO TBPK: ORAL_TABLET | ORAL | 0 refills | Status: AC

## 2019-03-12 MED ORDER — HYDROXYZINE HCL 25 MG PO TABS: 25.0000 mg | ORAL_TABLET | Freq: Four times a day (QID) | ORAL | 0 refills | Status: DC

## 2019-03-12 NOTE — Discharge Instructions (Addendum)
Take the prednisone as prescribed  You may apply calamine lotion to the affected areas to sooth the itching.  Avoid skin contact with anything that may have contacted the [facility] in your yard.  Wash any clothing that has, with hot water and detergent.   Use the hydroxyzine for itching as needed, this may cause drowsiness.   If not improving in 2-3 days, or significantly worsens, return to clinic to be reevaluated.  If you develop difficulty breathing or wheezing, please be evaluated at the emergency department or urgent care

## 2019-03-12 NOTE — ED Provider Notes (Signed)
[facility]    CSN: [id number] Arrival date & time: 03/12/19  1038      History   Chief Complaint Chief Complaint  Patient presents with  . Rash    HPI Donna Long is a 49 y.o. female.   Patient reports to urgent care today for 7-10 days of itchy rash on arms and left calf. She notes the first rash showed up on her right hand. It is red and very itchy. The remaining patches have showed up in no apparent pattern over the last 1 week or so. She denies itching other than areas of rash. She denies throat swelling, difficulty breathing or wheezing. She denies allergy other than noted in chart. She denies fever and chills. The rash does hurt at times. She has applied cortisone and taken benadryl without much relief.   She was concerned about if this was a reaction to recent bursitis injections. However on further interview she states she has large amounts of [place] in her yard. She does have a history of reaction to this but avoids those areas of her yard. She then notes she washes her brothers clothes, whom does work frequently in the yard.      Past Medical History:  Diagnosis Date  . Anemia    d/t gynecological issues  . Anxiety   . Arthritis   . Back injury   . Blood transfusion without reported diagnosis   . Chronic back pain   . Depression   . Dysrhythmia    HX SVT  . Fatty liver   . GERD (gastroesophageal reflux disease)   . Hypertension   . Sleep apnea   . SVT (supraventricular tachycardia) [facility])     Patient Active Problem List   Diagnosis Date Noted  . SVT (supraventricular tachycardia) (HCC) 11/16/2018  . S/P vaginal hysterectomy 10/17/2018  . Chest pain 06/30/2018  . Hyperglycemia 06/30/2018  . 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 Anemia 11/04/2016  . Anxiety 11/04/2016    Past Surgical History:  Procedure Laterality Date  . DILITATION & CURRETTAGE/HYSTROSCOPY WITH NOVASURE ABLATION N/A 08/16/2017   Procedure: DILATATION &  CURETTAGE/HYSTEROSCOPY WITH MINERVA  ENDOMETRIAL ABLATION;  Surgeon: Lazaro Arms, MD;  Location: [facility];  Service: Gynecology;  Laterality: N/A;  . SVT ABLATION N/A 11/16/2018   Procedure: SVT ABLATION;  Surgeon: Marinus Maw, MD;  [facility];  Service: Cardiovascular;  Laterality: N/A;  . TUBAL LIGATION    . VAGINAL HYSTERECTOMY N/A 10/17/2018   Procedure: HYSTERECTOMY VAGINAL;  Surgeon: Lazaro Arms, MD;  Location: [facility];  Service: Gynecology;  Laterality: N/A;  . WRIST SURGERY Right     OB History    Gravida  4   Para  4   Term      Preterm      AB      Living  4     SAB      TAB      Ectopic      Multiple      Live Births               Home Medications    Prior to Admission medications   Medication Sig Start Date End Date Taking? Authorizing Provider  esomeprazole (NEXIUM) 20 MG packet Take 20 mg by mouth daily before breakfast. OTC   Yes [provider]  Multiple Vitamins-Minerals (ALIVE WOMENS GUMMY PO) Take 2 each by mouth daily. Chewables   Yes [provider]  hydrOXYzine (ATARAX/VISTARIL) 25 MG tablet Take 1 tablet (25 mg total) by mouth every 6 (six) hours. 03/12/19   Terricka Onofrio, Veryl Speak, PA-C  metoprolol succinate (TOPROL XL) 25 MG 24 hr tablet Take 1 tablet (25 mg total) by mouth daily. Patient taking differently: Take 25 mg by mouth as needed.  10/26/18   Leone Brand, NP  predniSONE (STERAPRED UNI-PAK 21 TAB) 10 MG (21) TBPK tablet Take 6 tablets (60 mg total) by mouth daily for 1 day, THEN 5 tablets (50 mg total) daily for 1 day, THEN 4 tablets (40 mg total) daily for 1 day, THEN 3 tablets (30 mg total) daily for 1 day, THEN 2 tablets (20 mg total) daily for 1 day, THEN 1 tablet (10 mg total) daily for 1 day. 03/12/19 03/18/19  Ashante Yellin, Veryl Speak, PA-C    Family History Family History  Problem Relation Age of Onset  . Hypertension Mother   . Heart disease Mother   . COPD Father   . Hypertension Maternal Aunt   .  Diabetes Maternal Grandmother   . Kidney disease Sister   . Colon cancer Neg Hx   . Esophageal cancer Neg Hx   . Rectal cancer Neg Hx   . Stomach cancer Neg Hx     Social History Social History   Tobacco Use  . Smoking status: Former Smoker    Packs/day: 1.00    Years: 33.00    Pack years: 33.00    Types: Cigarettes    Quit date: 04/02/2008    Years since quitting: 10.9  . Smokeless tobacco: Never Used  Substance Use Topics  . Alcohol use: No  . Drug use: No     Allergies   Clindamycin/lincomycin   Review of Systems Review of Systems  Constitutional: Negative for chills and fever.  HENT: Negative.   Respiratory: Negative for cough, chest tightness, shortness of breath and wheezing.   Cardiovascular: Negative for chest pain and leg swelling.  Gastrointestinal: Negative for nausea.  Genitourinary: Negative.   Musculoskeletal: Negative for joint swelling and myalgias.  Skin: Positive for color change and rash. Negative for wound.  Neurological: Negative for weakness and headaches.     Physical Exam Triage Vital Signs ED Triage Vitals  Enc Vitals Group     BP 03/12/19 1124 (!) 170/121     Pulse Rate 03/12/19 1124 86     Resp 03/12/19 1124 18     Temp 03/12/19 1124 98.4 F (36.9 C)     Temp Source 03/12/19 1124 Oral     SpO2 03/12/19 1124 100 %     Weight --      Height --      Head Circumference --      Peak Flow --      Pain Score 03/12/19 1121 2     Pain Loc --      Pain Edu? --      Excl. in [place]? --    No data found.  Updated Vital Signs BP 114/80 Comment: obtained by Tashunda Vandezande, pa  Pulse 66   Temp 98.4 F (36.9 C) (Oral)   Resp 18   LMP 07/05/2017 (Approximate)   SpO2 100%   Visual Acuity Right Eye Distance:   Left Eye Distance:   Bilateral Distance:    Right Eye Near:   Left Eye Near:    Bilateral Near:     Physical Exam Vitals and nursing note reviewed.  Constitutional:      General:  She is not in acute distress.    Appearance: She  is well-developed.  HENT:     Head: Normocephalic and atraumatic.     Mouth/Throat:     Mouth: Mucous membranes are moist.     Pharynx: Oropharynx is clear.  Eyes:     Conjunctiva/sclera: Conjunctivae normal.  Cardiovascular:     Rate and Rhythm: Normal rate and regular rhythm.     Heart sounds: No murmur.  Pulmonary:     Effort: Pulmonary effort is normal. No respiratory distress.     Breath sounds: Normal breath sounds.  Abdominal:     Palpations: Abdomen is soft.     Tenderness: There is no abdominal tenderness.  Musculoskeletal:     Cervical back: Neck supple.     Right lower leg: No edema.     Left lower leg: No edema.  Skin:    General: Skin is warm and dry.     Findings: Rash (erythematous papular rash in patches with vesicular character, located on right and left arms and one small crop on left calf. pictures belowd) present.  Neurological:     General: No focal deficit present.     Mental Status: She is alert and oriented to person, place, and time.  Psychiatric:        Mood and Affect: Mood normal.        Behavior: Behavior normal.        Thought Content: Thought content normal.        Judgment: Judgment normal.            UC Treatments / Results  Labs (all labs ordered are listed, but only abnormal results are displayed) Labs Reviewed - No data to display  EKG   Radiology No results found.  Procedures Procedures (including critical care time)  Medications Ordered in UC Medications - No data to display  Initial Impression / Assessment and Plan / UC Course  I have reviewed the triage vital signs and the nursing notes.  Pertinent labs & imaging results that were available during my care of the patient were reviewed by me and considered in my medical decision making (see chart for details).     #Contact Dermatitis- irritant - believe this is from contact with clothing that had sumac oils on it. Prednisone taper given wide spread. Calamine and  hydroxyzine for symptom relief. Follow up instructions given. Instructed to avoid contact with this is the future and to wash clothes in hot water.    Final Clinical Impressions(s) / UC Diagnoses   Final diagnoses:  Irritant contact dermatitis due to plants, except food     Discharge Instructions     Take the prednisone as prescribed  You may apply calamine lotion to the affected areas to sooth the itching.  Avoid skin contact with anything that may have contacted the [facility] in your yard.  Wash any clothing that has, with hot water and detergent.   Use the hydroxyzine for itching as needed, this may cause drowsiness.   If not improving in 2-3 days, or significantly worsens, return to clinic to be reevaluated.  If you develop difficulty breathing or wheezing, please be evaluated at the emergency department or urgent care       ED Prescriptions    Medication Sig Dispense Auth. Provider   predniSONE (STERAPRED UNI-PAK 21 TAB) 10 MG (21) TBPK tablet Take 6 tablets (60 mg total) by mouth daily for 1 day, THEN 5 tablets (50 mg total) daily  for 1 day, THEN 4 tablets (40 mg total) daily for 1 day, THEN 3 tablets (30 mg total) daily for 1 day, THEN 2 tablets (20 mg total) daily for 1 day, THEN 1 tablet (10 mg total) daily for 1 day. 21 tablet Kacie Huxtable, Veryl Speak, PA-C   hydrOXYzine (ATARAX/VISTARIL) 25 MG tablet Take 1 tablet (25 mg total) by mouth every 6 (six) hours. 12 tablet Dusti Tetro, Veryl Speak, PA-C     PDMP not reviewed this encounter.   Hermelinda Medicus, PA-C 03/12/19 1450

## 2019-03-12 NOTE — ED Triage Notes (Signed)
Pt c/o rash to bilat arms, behind bilat knees with itching x approx 10 days. Pt states only new tx is shots for bursitis in bilat hips approx 3 weeks ago.  Denies any SOB, lip swelling, dysphagia. Has been taking benadryl and cortisone topical w/o improvement.

## 2019-04-16 ENCOUNTER — Telehealth: Payer: Self-pay

## 2019-04-16 NOTE — Telephone Encounter (Signed)
Pt complaining of palpitations.  Please call [phone]  Thanks renee

## 2019-04-16 NOTE — Telephone Encounter (Signed)
Returned pt call. She states that yesterday she did not feel her best and she was having continuous palpitations. She stated at one point she felt like she was having pressure. She took 2 bayer aspirins. She did not consume any more caffeine than normal for her, nor was she under any different kinds of stress. She also took her metoprolol but it did not help. She was not able to take blood pressure nor heart rate. She states she is a little better today. Please advise.

## 2019-04-18 NOTE — Telephone Encounter (Signed)
If her symptoms recurr, I would like her to wear a 14 day Zio patch monitor.

## 2019-04-19 NOTE — Telephone Encounter (Signed)
Await current insurance info from patient to enroll her in [place] monitoring

## 2019-04-19 NOTE — Telephone Encounter (Signed)
Attempt to reach patient, no answer, voicemail not set up.

## 2019-04-23 NOTE — Telephone Encounter (Signed)
I spoke with customer service at [facility] and they said enter patient as no insurance.They said after they speak with her they felt confidant that she qualifies for free monitor, and would set up delivery.    Patient has letter from [facility] giving 100 % free care.Scanned under financial correspondence

## 2019-04-23 NOTE — Telephone Encounter (Signed)
I see patient started Zio monitor on 04/19/19

## 2019-04-23 NOTE — Telephone Encounter (Signed)
LM for pt to call back to check on status of event monitor

## 2019-04-24 ENCOUNTER — Ambulatory Visit (INDEPENDENT_AMBULATORY_CARE_PROVIDER_SITE_OTHER): Payer: Self-pay

## 2019-04-24 DIAGNOSIS — R002 Palpitations: Secondary | ICD-10-CM

## 2019-04-30 ENCOUNTER — Other Ambulatory Visit: Payer: Self-pay

## 2019-04-30 ENCOUNTER — Ambulatory Visit
Admission: EM | Admit: 2019-04-30 | Discharge: 2019-04-30 | Disposition: A | Discharge status: Home / Self Care | Payer: Self-pay | Attending: Emergency Medicine | Admitting: Emergency Medicine

## 2019-04-30 DIAGNOSIS — L239 Allergic contact dermatitis, unspecified cause: Secondary | ICD-10-CM

## 2019-04-30 MED ORDER — METHYLPREDNISOLONE SODIUM SUCC 125 MG IJ SOLR
125.0000 mg | Freq: Once | INTRAMUSCULAR | Status: AC
  Administered 2019-04-30: 125 mg via INTRAMUSCULAR

## 2019-04-30 MED ORDER — PREDNISONE 10 MG (21) PO TBPK: ORAL_TABLET | ORAL | 0 refills | Status: DC

## 2019-04-30 NOTE — ED Triage Notes (Signed)
Pt presents with complaints of rash to bilateral arms and legs x over a week. States that she believes it is coming from her medication for Bursitis. She states that this happened last time she had the medication and was not aware it was resulting from that. States the steroids helped last time. Denies any other symptoms.

## 2019-04-30 NOTE — ED Provider Notes (Addendum)
[facility]    CSN: [id number] Arrival date & time: 04/30/19  1030      History   Chief Complaint Chief Complaint  Patient presents with  . Rash    HPI Donna Long is a 49 y.o. female.   Who presented to the urgent care for complaint of rash for the past 1 week.  She denies changes in soaps, detergents, or anyone with similar symptoms.  She localizes the rash to her upper arm and bilateral lower extremities.  She describes it as itchy, red and spreading.  She has tried OTC hydrocortisone without relief.  Her symptoms are made worse with heat.  She reports similar symptoms in the past that improved with prednisone.   The history is provided by the patient. No language interpreter was used.  Rash   Past Medical History:  Diagnosis Date  . Anemia    d/t gynecological issues  . Anxiety   . Arthritis   . Back injury   . Blood transfusion without reported diagnosis   . Chronic back pain   . Depression   . Dysrhythmia    HX SVT  . Fatty liver   . GERD (gastroesophageal reflux disease)   . Hypertension   . Sleep apnea   . SVT (supraventricular tachycardia) [facility])     Patient Active Problem List   Diagnosis Date Noted  . SVT (supraventricular tachycardia) (HCC) 11/16/2018  . S/P vaginal hysterectomy 10/17/2018  . Chest pain 06/30/2018  . Hyperglycemia 06/30/2018  . 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 Anemia 11/04/2016  . Anxiety 11/04/2016    Past Surgical History:  Procedure Laterality Date  . DILITATION & CURRETTAGE/HYSTROSCOPY WITH NOVASURE ABLATION N/A 08/16/2017   Procedure: DILATATION & CURETTAGE/HYSTEROSCOPY WITH MINERVA  ENDOMETRIAL ABLATION;  Surgeon: Lazaro Arms, MD;  Location: [facility];  Service: Gynecology;  Laterality: N/A;  . SVT ABLATION N/A 11/16/2018   Procedure: SVT ABLATION;  Surgeon: Marinus Maw, MD;  [facility];  Service: Cardiovascular;  Laterality: N/A;  . TUBAL LIGATION    . VAGINAL  HYSTERECTOMY N/A 10/17/2018   Procedure: HYSTERECTOMY VAGINAL;  Surgeon: Lazaro Arms, MD;  Location: [facility];  Service: Gynecology;  Laterality: N/A;  . WRIST SURGERY Right     OB History    Gravida  4   Para  4   Term      Preterm      AB      Living  4     SAB      TAB      Ectopic      Multiple      Live Births               Home Medications    Prior to Admission medications   Medication Sig Start Date End Date Taking? Authorizing Provider  esomeprazole (NEXIUM) 20 MG packet Take 20 mg by mouth daily before breakfast. OTC   Yes [provider]  metoprolol succinate (TOPROL XL) 25 MG 24 hr tablet Take 1 tablet (25 mg total) by mouth daily. Patient taking differently: Take 25 mg by mouth as needed.  10/26/18  Yes Leone Brand, NP  Multiple Vitamins-Minerals (ALIVE WOMENS GUMMY PO) Take 2 each by mouth daily. Chewables   Yes [provider]  hydrOXYzine (ATARAX/VISTARIL) 25 MG tablet Take 1 tablet (25 mg total) by mouth every 6 (six) hours. 03/12/19   Darr, Veryl Speak, PA-C  predniSONE (STERAPRED Carlus Pavlov  21 TAB) 10 MG (21) TBPK tablet Take 6 tabs by mouth daily  for 2 days, then 5 tabs for 2 days, then 4 tabs for 2 days, then 3 tabs for 2 days, 2 tabs for 2 days, then 1 tab by mouth daily for 2 days 04/30/19   Emerson Monte, FNP    Family History Family History  Problem Relation Age of Onset  . Hypertension Mother   . Heart disease Mother   . COPD Father   . Hypertension Maternal Aunt   . Diabetes Maternal Grandmother   . Kidney disease Sister   . Colon cancer Neg Hx   . Esophageal cancer Neg Hx   . Rectal cancer Neg Hx   . Stomach cancer Neg Hx     Social History Social History   Tobacco Use  . Smoking status: Former Smoker    Packs/day: 1.00    Years: 33.00    Pack years: 33.00    Types: Cigarettes    Quit date: 04/02/2008    Years since quitting: 11.0  . Smokeless tobacco: Never Used  Substance Use Topics  . Alcohol use:  No  . Drug use: No     Allergies   Clindamycin/lincomycin   Review of Systems Review of Systems  Constitutional: Negative.   Respiratory: Negative.   Cardiovascular: Negative.   Skin: Positive for color change and rash.  All other systems reviewed and are negative.    Physical Exam Triage Vital Signs ED Triage Vitals  Enc Vitals Group     BP 04/30/19 1038 (!) 142/78     Pulse Rate 04/30/19 1038 72     Resp 04/30/19 1038 17     Temp 04/30/19 1038 98.3 F (36.8 C)     Temp Source 04/30/19 1038 Oral     SpO2 04/30/19 1038 99 %     Weight --      Height --      Head Circumference --      Peak Flow --      Pain Score 04/30/19 1050 0     Pain Loc --      Pain Edu? --      Excl. in [place]? --    No data found.  Updated Vital Signs BP (!) 142/78 (BP Location: Right Arm)   Pulse 72   Temp 98.3 F (36.8 C) (Oral)   Resp 17   LMP 07/05/2017 (Approximate)   SpO2 99%   Visual Acuity Right Eye Distance:   Left Eye Distance:   Bilateral Distance:    Right Eye Near:   Left Eye Near:    Bilateral Near:     Physical Exam Vitals and nursing note reviewed.  Constitutional:      General: She is not in acute distress.    Appearance: Normal appearance. She is normal weight. She is not ill-appearing, toxic-appearing or diaphoretic.  Cardiovascular:     Rate and Rhythm: Normal rate and regular rhythm.     Pulses: Normal pulses.     Heart sounds: Normal heart sounds. No murmur. No gallop.   Pulmonary:     Effort: Pulmonary effort is normal. No respiratory distress.     Breath sounds: Normal breath sounds. No stridor. No wheezing, rhonchi or rales.  Chest:     Chest wall: No tenderness.  Skin:    General: Skin is warm.     Coloration: Skin is not jaundiced or pale.     Findings: Rash present. No bruising.  Rash is macular.  Neurological:     Mental Status: She is alert.      UC Treatments / Results  Labs (all labs ordered are listed, but only abnormal results are  displayed) Labs Reviewed - No data to display  EKG   Radiology No results found.  Procedures Procedures (including critical care time)  Medications Ordered in UC Medications  methylPREDNISolone sodium succinate (SOLU-MEDROL) 125 mg/2 mL injection 125 mg (125 mg Intramuscular Given 04/30/19 1113)    Initial Impression / Assessment and Plan / UC Course  I have reviewed the triage vital signs and the nursing notes.  Pertinent labs & imaging results that were available during my care of the patient were reviewed by me and considered in my medical decision making (see chart for details).    Patient is stable at discharge. Solu-Medrol IM was given in office Prednisone was prescribed-take as directed Follow-up with PCP or return for worsening symptoms  Final Clinical Impressions(s) / UC Diagnoses   Final diagnoses:  Allergic contact dermatitis, unspecified trigger     Discharge Instructions     Continue to use Benadryl as prescribed Prednisone was prescribed/take it as directed Limit hot shower and baths, or bathe with warm water.   Moisturize skin daily Follow up with PCP if symptoms persists Return or go to the ER if you have any new or worsening symptoms     ED Prescriptions    Medication Sig Dispense Auth. Provider   predniSONE (STERAPRED UNI-PAK 21 TAB) 10 MG (21) TBPK tablet Take 6 tabs by mouth daily  for 2 days, then 5 tabs for 2 days, then 4 tabs for 2 days, then 3 tabs for 2 days, 2 tabs for 2 days, then 1 tab by mouth daily for 2 days 42 tablet Azul Coffie, Zachery Dakins, FNP     PDMP not reviewed this encounter.   Durward Parcel, FNP 04/30/19 1110    Durward Parcel, FNP 04/30/19 1115

## 2019-04-30 NOTE — Discharge Instructions (Addendum)
Continue to use Benadryl as prescribed Prednisone was prescribed/take it as directed Limit hot shower and baths, or bathe with warm water.   Moisturize skin daily Follow up with PCP if symptoms persists Return or go to the ER if you have any new or worsening symptoms

## 2019-05-13 ENCOUNTER — Ambulatory Visit
Admission: EM | Admit: 2019-05-13 | Discharge: 2019-05-13 | Disposition: A | Discharge status: Home / Self Care | Payer: Self-pay | Attending: Emergency Medicine | Admitting: Emergency Medicine

## 2019-05-13 ENCOUNTER — Encounter: Payer: Self-pay | Admitting: Emergency Medicine

## 2019-05-13 ENCOUNTER — Other Ambulatory Visit: Payer: Self-pay

## 2019-05-13 DIAGNOSIS — J302 Other seasonal allergic rhinitis: Secondary | ICD-10-CM

## 2019-05-13 DIAGNOSIS — J029 Acute pharyngitis, unspecified: Secondary | ICD-10-CM

## 2019-05-13 MED ORDER — PREDNISONE 10 MG PO TABS: 20.0000 mg | ORAL_TABLET | Freq: Every day | ORAL | 0 refills | Status: DC

## 2019-05-13 NOTE — Discharge Instructions (Addendum)
Get plenty of rest and push fluids Continue to take Zyrtec. Use daily for symptomatic relief Continue to use Flonase as prescribed Short-term prednisone was added Advised to follow-up with prescribing provider to review CPAP management Drink warm or cool liquids, use throat lozenges, or popsicles to help alleviate symptoms Take OTC ibuprofen or tylenol as needed for pain Follow up with PCP if symptoms persists Return or go to ER if patient has any new or worsening symptoms such as fever, chills, nausea, vomiting, worsening sore throat, cough, abdominal pain, chest pain, changes in bowel or bladder habits, etc...  Reviewed expectations re: course of current medical issues. Questions answered. Outlined signs and symptoms indicating need for more acute intervention. Patient verbalized understanding. After Visit Summary given.

## 2019-05-13 NOTE — ED Provider Notes (Signed)
[facility]    CSN: [id number] Arrival date & time: 05/13/19  4098      History   Chief Complaint Chief Complaint  Patient presents with  . Sore Throat    HPI Donna Long is a 49 y.o. female.   Who present to the urgent care with a complaint of  sore throat, sinus congestion and neck pain for the past 3 to 4 days.  Reports she started using CPAP last 2 weeks.  Denies sick exposure to COVID, flu or strep.  Denies recent travel.  Denies aggravating or alleviating symptoms.  Denies previous COVID infection.   Denies fever, chills, fatigue, rhinorrhea, cough, SOB, wheezing, chest pain, nausea, vomiting, changes in bowel or bladder habits.    The history is provided by the patient. No language interpreter was used.    Past Medical History:  Diagnosis Date  . Anemia    d/t gynecological issues  . Anxiety   . Arthritis   . Back injury   . Blood transfusion without reported diagnosis   . Chronic back pain   . Depression   . Dysrhythmia    HX SVT  . Fatty liver   . GERD (gastroesophageal reflux disease)   . Hypertension   . Sleep apnea   . SVT (supraventricular tachycardia) [facility])     Patient Active Problem List   Diagnosis Date Noted  . SVT (supraventricular tachycardia) (HCC) 11/16/2018  . S/P vaginal hysterectomy 10/17/2018  . Chest pain 06/30/2018  . Hyperglycemia 06/30/2018  . 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 Anemia 11/04/2016  . Anxiety 11/04/2016    Past Surgical History:  Procedure Laterality Date  . DILITATION & CURRETTAGE/HYSTROSCOPY WITH NOVASURE ABLATION N/A 08/16/2017   Procedure: DILATATION & CURETTAGE/HYSTEROSCOPY WITH MINERVA  ENDOMETRIAL ABLATION;  Surgeon: Lazaro Arms, MD;  Location: [facility];  Service: Gynecology;  Laterality: N/A;  . SVT ABLATION N/A 11/16/2018   Procedure: SVT ABLATION;  Surgeon: Marinus Maw, MD;  Location: [facility];  Service: Cardiovascular;  Laterality: N/A;  . TUBAL LIGATION    .  VAGINAL HYSTERECTOMY N/A 10/17/2018   Procedure: HYSTERECTOMY VAGINAL;  Surgeon: Lazaro Arms, MD;  Location: [facility];  Service: Gynecology;  Laterality: N/A;  . WRIST SURGERY Right     OB History    Gravida  4   Para  4   Term      Preterm      AB      Living  4     SAB      TAB      Ectopic      Multiple      Live Births               Home Medications    Prior to Admission medications   Medication Sig Start Date End Date Taking? Authorizing Provider  esomeprazole (NEXIUM) 20 MG packet Take 20 mg by mouth daily before breakfast. OTC    [provider]  hydrOXYzine (ATARAX/VISTARIL) 25 MG tablet Take 1 tablet (25 mg total) by mouth every 6 (six) hours. 03/12/19   Darr, Veryl Speak, PA-C  metoprolol succinate (TOPROL XL) 25 MG 24 hr tablet Take 1 tablet (25 mg total) by mouth daily. Patient taking differently: Take 25 mg by mouth as needed.  10/26/18   Leone Brand, NP  Multiple Vitamins-Minerals (ALIVE WOMENS GUMMY PO) Take 2 each by mouth daily. Chewables    [provider]  predniSONE (  STERAPRED UNI-PAK 21 TAB) 10 MG (21) TBPK tablet Take 6 tabs by mouth daily  for 2 days, then 5 tabs for 2 days, then 4 tabs for 2 days, then 3 tabs for 2 days, 2 tabs for 2 days, then 1 tab by mouth daily for 2 days 04/30/19   Durward Parcel, FNP    Family History Family History  Problem Relation Age of Onset  . Hypertension Mother   . Heart disease Mother   . COPD Father   . Hypertension Maternal Aunt   . Diabetes Maternal Grandmother   . Kidney disease Sister   . Colon cancer Neg Hx   . Esophageal cancer Neg Hx   . Rectal cancer Neg Hx   . Stomach cancer Neg Hx     Social History Social History   Tobacco Use  . Smoking status: Former Smoker    Packs/day: 1.00    Years: 33.00    Pack years: 33.00    Types: Cigarettes    Quit date: 04/02/2008    Years since quitting: 11.1  . Smokeless tobacco: Never Used  Substance Use Topics  . Alcohol use:  No  . Drug use: No     Allergies   Clindamycin/lincomycin   Review of Systems Review of Systems  Constitutional: Negative.   HENT: Positive for congestion and sore throat.   Respiratory: Negative.   Cardiovascular: Negative.   Gastrointestinal: Negative.   Musculoskeletal: Positive for neck pain.  All other systems reviewed and are negative.    Physical Exam Triage Vital Signs ED Triage Vitals  Enc Vitals Group     BP      Pulse      Resp      Temp      Temp src      SpO2      Weight      Height      Head Circumference      Peak Flow      Pain Score      Pain Loc      Pain Edu?      Excl. in GC?    No data found.  Updated Vital Signs BP (!) 143/98 (BP Location: Right Arm)   Pulse 72   Temp 97.9 F (36.6 C) (Oral)   Resp 18   Ht 5\' 6"  (1.676 m)   Wt 276 lb (125.2 kg)   LMP 07/05/2017 (Approximate)   SpO2 98%   BMI 44.55 kg/m   Visual Acuity Right Eye Distance:   Left Eye Distance:   Bilateral Distance:    Right Eye Near:   Left Eye Near:    Bilateral Near:     Physical Exam Vitals and nursing note reviewed.  Constitutional:      General: She is not in acute distress.    Appearance: Normal appearance. She is normal weight. She is not ill-appearing, toxic-appearing or diaphoretic.  HENT:     Head: Normocephalic and atraumatic.     Right Ear: Tympanic membrane, ear canal and external ear normal. There is no impacted cerumen.     Left Ear: Tympanic membrane and external ear normal. There is no impacted cerumen.     Nose: Congestion and rhinorrhea present.     Mouth/Throat:     Lips: Pink.     Mouth: Mucous membranes are moist.     Tongue: No lesions.     Pharynx: Oropharynx is clear. No oropharyngeal exudate.     Tonsils: No  tonsillar exudate or tonsillar abscesses. 2+ on the right. 2+ on the left.  Cardiovascular:     Rate and Rhythm: Normal rate and regular rhythm.     Pulses: Normal pulses.     Heart sounds: Normal heart sounds. No  murmur. No gallop.   Pulmonary:     Effort: Pulmonary effort is normal. No respiratory distress.     Breath sounds: Normal breath sounds. No stridor. No wheezing, rhonchi or rales.  Chest:     Chest wall: No tenderness.  Abdominal:     General: Abdomen is flat. Bowel sounds are normal. There is no distension.     Palpations: Abdomen is soft. There is no mass.  Musculoskeletal:     Cervical back: Normal range of motion. Tenderness present. No rigidity.  Lymphadenopathy:     Cervical: No cervical adenopathy.  Neurological:     Mental Status: She is alert.      UC Treatments / Results  Labs (all labs ordered are listed, but only abnormal results are displayed) Labs Reviewed - No data to display  EKG   Radiology No results found.  Procedures Procedures (including critical care time)  Medications Ordered in UC Medications - No data to display  Initial Impression / Assessment and Plan / UC Course  I have reviewed the triage vital signs and the nursing notes.  Pertinent labs & imaging results that were available during my care of the patient were reviewed by me and considered in my medical decision making (see chart for details).    Patient is stable for discharge. Patient was advised to continue to take Zyrtec and Flonase as prescribed. Short-term prednisone was added. Was advised to follow-up with prescribing provider to review CPAP management. Return or go to ED for worsening of symptoms.  Final Clinical Impressions(s) / UC Diagnoses   Final diagnoses:  Sore throat  Seasonal allergic rhinitis, unspecified trigger     Discharge Instructions     Get plenty of rest and push fluids Continue to take Zyrtec. Use daily for symptomatic relief Continue to use Flonase as prescribed Short-term prednisone was added Advised to follow-up with prescribing provider to review CPAP management Drink warm or cool liquids, use throat lozenges, or popsicles to help alleviate  symptoms Take OTC ibuprofen or tylenol as needed for pain Follow up with PCP if symptoms persists Return or go to ER if patient has any new or worsening symptoms such as fever, chills, nausea, vomiting, worsening sore throat, cough, abdominal pain, chest pain, changes in bowel or bladder habits, etc...  Reviewed expectations re: course of current medical issues. Questions answered. Outlined signs and symptoms indicating need for more acute intervention. Patient verbalized understanding. After Visit Summary given.          ED Prescriptions    None     PDMP not reviewed this encounter.   Emerson Monte, [place] 05/13/19 [phone]

## 2019-05-13 NOTE — ED Triage Notes (Signed)
Pt c/o of sore throat, sinus drainage and neck pain x 5 days. Concerning that her CPAP is causing sore throat

## 2019-05-14 ENCOUNTER — Other Ambulatory Visit: Payer: Self-pay

## 2019-05-14 DIAGNOSIS — R002 Palpitations: Secondary | ICD-10-CM

## 2019-05-20 ENCOUNTER — Telehealth: Payer: Self-pay

## 2019-05-20 NOTE — Telephone Encounter (Signed)
5\' 6"  (1.676 m) 276 lb (125.2 kg) 44.57  Study Highlights  1. NSR with sinus brady and sinus tachycardia 2. Frequent PVC's 3. NSVT, bidirectional 4. No atrial fib 5. No prolonged pauses 6. NS atrial tachycardia       Patient still feels frequent PVC's but the Toprol XL 25 mg drops her BP (90/60) and she reports HR in the 40's at times.    Please advise

## 2019-05-20 NOTE — Telephone Encounter (Signed)
Pt would like monitor results.  Please call [phone]   Thanks renee

## 2019-05-22 NOTE — Telephone Encounter (Signed)
F/u scheduled for Pt with [facility] in [employer] for further management.

## 2019-05-22 NOTE — Telephone Encounter (Signed)
Pt needs to be seen.  S/p ablation October 2020 and no follow up scheduled that I can determine by chart review.  Sending to [facility] nurse and scheduling to set up follow up s/p heart monitor and continued palpitations.

## 2019-05-29 ENCOUNTER — Other Ambulatory Visit: Payer: Self-pay

## 2019-05-29 ENCOUNTER — Ambulatory Visit (INDEPENDENT_AMBULATORY_CARE_PROVIDER_SITE_OTHER): Payer: Self-pay | Admitting: Internal Medicine

## 2019-05-29 ENCOUNTER — Encounter: Payer: Self-pay | Admitting: Internal Medicine

## 2019-05-29 VITALS — BP 126/78 | HR 70 | Temp 98.9°F | Ht 66.5 in | Wt 279.0 lb

## 2019-05-29 DIAGNOSIS — R002 Palpitations: Secondary | ICD-10-CM

## 2019-05-29 MED ORDER — FLECAINIDE ACETATE 50 MG PO TABS: 50.0000 mg | ORAL_TABLET | Freq: Two times a day (BID) | ORAL | 3 refills | Status: DC

## 2019-05-29 NOTE — Progress Notes (Signed)
HPI [name] returns today for followup. She has a h/o SVT and underwent EP study and catheter ablation in 10/20. In the interim, she notes palpitations and wore a cardiac monitor a few weeks ago which demonstrated NSVT, PVC's, PAC's and very brief burst (2 seconds) of AT. She feels palpitations which have worsened as she has been under increased stress.  Allergies  Allergen Reactions  . Clindamycin/Lincomycin Rash     Current Outpatient Medications  Medication Sig Dispense Refill  . esomeprazole (NEXIUM) 20 MG packet Take 20 mg by mouth daily before breakfast. OTC    . hydrOXYzine (ATARAX/VISTARIL) 25 MG tablet Take 1 tablet (25 mg total) by mouth every 6 (six) hours. 12 tablet 0  . metoprolol succinate (TOPROL XL) 25 MG 24 hr tablet Take 1 tablet (25 mg total) by mouth daily. (Patient taking differently: Take 25 mg by mouth as needed. ) 90 tablet 3  . Multiple Vitamins-Minerals (ALIVE WOMENS GUMMY PO) Take 2 each by mouth daily. Chewables    . predniSONE (DELTASONE) 10 MG tablet Take 2 tablets (20 mg total) by mouth daily. 15 tablet 0   No current facility-administered medications for this visit.     Past Medical History:  Diagnosis Date  . Anemia    d/t gynecological issues  . Anxiety   . Arthritis   . Back injury   . Blood transfusion without reported diagnosis   . Chronic back pain   . Depression   . Dysrhythmia    HX SVT  . Fatty liver   . GERD (gastroesophageal reflux disease)   . Hypertension   . Sleep apnea   . SVT (supraventricular tachycardia) (HCC)     ROS:   All systems reviewed and negative except as noted in the HPI.   Past Surgical History:  Procedure Laterality Date  . DILITATION & CURRETTAGE/HYSTROSCOPY WITH NOVASURE ABLATION N/A 08/16/2017   Procedure: DILATATION & CURETTAGE/HYSTEROSCOPY WITH MINERVA  ENDOMETRIAL ABLATION;  Surgeon: Donna Arms, MD;  Location: [facility];  Service: Gynecology;  Laterality: N/A;  . SVT ABLATION N/A 11/16/2018    Procedure: SVT ABLATION;  Surgeon: Donna Maw, MD;  Location: [facility];  Service: Cardiovascular;  Laterality: N/A;  . TUBAL LIGATION    . VAGINAL HYSTERECTOMY N/A 10/17/2018   Procedure: HYSTERECTOMY VAGINAL;  Surgeon: Donna Arms, MD;  Location: [facility];  Service: Gynecology;  Laterality: N/A;  . WRIST SURGERY Right      Family History  Problem Relation Age of Onset  . Hypertension Mother   . Heart disease Mother   . COPD Father   . Hypertension Maternal Aunt   . Diabetes Maternal Grandmother   . Kidney disease Sister   . Colon cancer Neg Hx   . Esophageal cancer Neg Hx   . Rectal cancer Neg Hx   . Stomach cancer Neg Hx      Social History   Socioeconomic History  . Marital status: Single    Spouse name: Not on file  . Number of children: 4  . Years of education: Not on file  . Highest education level: Not on file  Occupational History  . Not on file  Tobacco Use  . Smoking status: Former Smoker    Packs/day: 1.00    Years: 33.00    Pack years: 33.00    Types: Cigarettes    Quit date: 04/02/2008    Years since quitting: 11.1  . Smokeless tobacco: Never Used  Substance and Sexual Activity  . Alcohol use: No  . Drug use: No  . Sexual activity: Not Currently    Birth control/protection: Surgical  Other Topics Concern  . Not on file  Social History Narrative  . Not on file   Social Determinants of Health   Financial Resource Strain:   . Difficulty of Paying Living Expenses:   Food Insecurity:   . Worried About Charity fundraiser in the Last Year:   . Arboriculturist in the Last Year:   Transportation Needs:   . Film/video editor (Medical):   Marland Kitchen Lack of Transportation (Non-Medical):   Physical Activity:   . Days of Exercise per Week:   . Minutes of Exercise per Session:   Stress:   . Feeling of Stress :   Social Connections:   . Frequency of Communication with Friends and Family:   . Frequency of Social Gatherings with Friends  and Family:   . Attends Religious Services:   . Active Member of Clubs or Organizations:   . Attends Archivist Meetings:   Marland Kitchen Marital Status:   Intimate Partner Violence:   . Fear of Current or Ex-Partner:   . Emotionally Abused:   Marland Kitchen Physically Abused:   . Sexually Abused:      BP 126/78   Pulse 70   Temp 98.9 F (37.2 C)   Ht 5' 6.5" (1.689 m)   Wt 279 lb (126.6 kg)   LMP 07/05/2017 (Approximate)   SpO2 98%   BMI 44.36 kg/m   Physical Exam:  Well appearing NAD HEENT: Unremarkable Neck:  No JVD, no thyromegally Lymphatics:  No adenopathy Back:  No CVA tenderness Lungs:  Clear with no wheezes HEART:  Regular rate rhythm, no murmurs, no rubs, no clicks Abd:  soft, positive bowel sounds, no organomegally, no rebound, no guarding Ext:  2 plus pulses, no edema, no cyanosis, no clubbing Skin:  No rashes no nodules Neuro:  CN II through XII intact, motor grossly intact  EKG - none  Assess/Plan: 1. Palpitations - no evidence of recurrent SVT but she has had a lot of PVC's, PAC's and NSVT/NS SVT. I have discussed the treatment options with the patient including uptitration of her beta blocker or initiation of flecainide. She had low bp on the increased toprol in the past. She will start flecainide 50 bid and come back for a 12 lead ECG nurse visit in 2 weeks.  2. Obesity- she has lost 30 lbs but gained back 9. I encouraged her to lose weight. 3. HTN - her bp is well controlled on low dose toprol.  Donna Long.D.

## 2019-05-29 NOTE — Patient Instructions (Addendum)
Medication Instructions:  Your physician has recommended you make the following change in your medication:   Start Taking Flecainide 50 mg Two times Daily   *If you need a refill on your cardiac medications before your next appointment, please call your pharmacy*   Lab Work: None  If you have labs (blood work) drawn today and your tests are completely normal, you will receive your results only by: Marland Kitchen MyChart Message (if you have MyChart) OR . A paper copy in the mail If you have any lab test that is abnormal or we need to change your treatment, we will call you to review the results.   Testing/Procedures: NONE    Follow-Up: At [facility], you and your health needs are our priority.  As part of our continuing mission to provide you with exceptional heart care, we have created designated Provider Care Teams.  These Care Teams include your primary Cardiologist (physician) and Advanced Practice Providers (APPs -  Physician Assistants and Nurse Practitioners) who all work together to provide you with the care you need, when you need it.  We recommend signing up for the patient portal called "MyChart".  Sign up information is provided on this After Visit Summary.  MyChart is used to connect with patients for Virtual Visits (Telemedicine).  Patients are able to view lab/test results, encounter notes, upcoming appointments, etc.  Non-urgent messages can be sent to your provider as well.   To learn more about what you can do with MyChart, go to [email or web address].    Your next appointment:   6 week(s)  The format for your next appointment:   In Person  Provider:   Lewayne Bunting, MD   Other Instructions Thank you for choosing [facility]!

## 2019-06-12 ENCOUNTER — Ambulatory Visit (INDEPENDENT_AMBULATORY_CARE_PROVIDER_SITE_OTHER): Payer: Self-pay | Admitting: *Deleted

## 2019-06-12 ENCOUNTER — Other Ambulatory Visit: Payer: Self-pay

## 2019-06-12 DIAGNOSIS — R002 Palpitations: Secondary | ICD-10-CM

## 2019-06-12 NOTE — Progress Notes (Signed)
Pt in office for EKG after starting flecainide. Pt states that palpitations have decreased and she feels better.

## 2019-06-24 ENCOUNTER — Ambulatory Visit
Admission: EM | Admit: 2019-06-24 | Discharge: 2019-06-24 | Disposition: A | Discharge status: Home / Self Care | Payer: Self-pay | Attending: Emergency Medicine | Admitting: Emergency Medicine

## 2019-06-24 ENCOUNTER — Other Ambulatory Visit: Payer: Self-pay

## 2019-06-24 DIAGNOSIS — M778 Other enthesopathies, not elsewhere classified: Secondary | ICD-10-CM

## 2019-06-24 MED ORDER — PREDNISONE 10 MG PO TABS: 20.0000 mg | ORAL_TABLET | Freq: Every day | ORAL | 0 refills | Status: DC

## 2019-06-24 NOTE — Discharge Instructions (Addendum)
Follow RICE instruction that is attached Take OTC Tylenol as needed for pain Prednisone was prescribed to decrease inflammation Follow-up with PCP Return or go to ED for worsening of symptoms

## 2019-06-24 NOTE — ED Triage Notes (Signed)
Pt had a fall 1 week ago, c/o left elbow and left wrist pain that started 3-4 days after the fall.

## 2019-06-24 NOTE — ED Provider Notes (Signed)
[facility]    CSN: [id number] Arrival date & time: 06/24/19  1962      History   Chief Complaint Chief Complaint  Patient presents with  . Elbow Pain    HPI Donna Long is a 49 y.o. female.   Who presented to the urgent care with a complaint of left elbow pain for the past 3 to 4 days.  Reports pain radiates to the left wrist on range of motion.  Denies any precipitating event.  Localized pain to the left elbow and the wrist.  She described the pain as constant and achy, currently rated at 2 on a scale of 1-10.  She has tried OTC medication with mild relief.  Her symptoms are made worse with ROM.  She denies similar symptoms in the past.  Denies chills, fever, nausea, vomiting, diarrhea   The history is provided by the patient. No language interpreter was used.    Past Medical History:  Diagnosis Date  . Anemia    d/t gynecological issues  . Anxiety   . Arthritis   . Back injury   . Blood transfusion without reported diagnosis   . Chronic back pain   . Depression   . Dysrhythmia    HX SVT  . Fatty liver   . GERD (gastroesophageal reflux disease)   . Hypertension   . Sleep apnea   . SVT (supraventricular tachycardia) [facility])     Patient Active Problem List   Diagnosis Date Noted  . SVT (supraventricular tachycardia) (HCC) 11/16/2018  . S/P vaginal hysterectomy 10/17/2018  . Chest pain 06/30/2018  . Hyperglycemia 06/30/2018  . 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 Anemia 11/04/2016  . Anxiety 11/04/2016    Past Surgical History:  Procedure Laterality Date  . DILITATION & CURRETTAGE/HYSTROSCOPY WITH NOVASURE ABLATION N/A 08/16/2017   Procedure: DILATATION & CURETTAGE/HYSTEROSCOPY WITH MINERVA  ENDOMETRIAL ABLATION;  Surgeon: Lazaro Arms, MD;  Location: [facility];  Service: Gynecology;  Laterality: N/A;  . SVT ABLATION N/A 11/16/2018   Procedure: SVT ABLATION;  Surgeon: Marinus Maw, MD;  Location: [facility];  Service:  Cardiovascular;  Laterality: N/A;  . TUBAL LIGATION    . VAGINAL HYSTERECTOMY N/A 10/17/2018   Procedure: HYSTERECTOMY VAGINAL;  Surgeon: Lazaro Arms, MD;  Location: [facility];  Service: Gynecology;  Laterality: N/A;  . WRIST SURGERY Right     OB History    Gravida  4   Para  4   Term      Preterm      AB      Living  4     SAB      TAB      Ectopic      Multiple      Live Births               Home Medications    Prior to Admission medications   Medication Sig Start Date End Date Taking? Authorizing Provider  esomeprazole (NEXIUM) 20 MG packet Take 20 mg by mouth daily before breakfast. OTC    [provider]  flecainide (TAMBOCOR) 50 MG tablet Take 1 tablet (50 mg total) by mouth 2 (two) times daily. 05/29/19   Marinus Maw, MD  hydrOXYzine (ATARAX/VISTARIL) 25 MG tablet Take 1 tablet (25 mg total) by mouth every 6 (six) hours. 03/12/19   Darr, Veryl Speak, PA-C  metoprolol succinate (TOPROL XL) 25 MG 24 hr tablet Take 1 tablet (25 mg total)  by mouth daily. Patient taking differently: Take 25 mg by mouth as needed.  10/26/18   Leone Brand, NP  Multiple Vitamins-Minerals (ALIVE WOMENS GUMMY PO) Take 2 each by mouth daily. Chewables    [provider]  predniSONE (DELTASONE) 10 MG tablet Take 2 tablets (20 mg total) by mouth daily. 06/24/19   Avegno, Zachery Dakins, FNP    Family History Family History  Problem Relation Age of Onset  . Hypertension Mother   . Heart disease Mother   . COPD Father   . Hypertension Maternal Aunt   . Diabetes Maternal Grandmother   . Kidney disease Sister   . Colon cancer Neg Hx   . Esophageal cancer Neg Hx   . Rectal cancer Neg Hx   . Stomach cancer Neg Hx     Social History Social History   Tobacco Use  . Smoking status: Former Smoker    Packs/day: 1.00    Years: 33.00    Pack years: 33.00    Types: Cigarettes    Quit date: 04/02/2008    Years since quitting: 11.2  . Smokeless tobacco: Never Used   Substance Use Topics  . Alcohol use: No  . Drug use: No     Allergies   Clindamycin/lincomycin   Review of Systems Review of Systems  Constitutional: Negative.   Respiratory: Negative.   Cardiovascular: Negative.   Musculoskeletal: Positive for arthralgias.  All other systems reviewed and are negative.    Physical Exam Triage Vital Signs ED Triage Vitals [06/24/19 0958]  Enc Vitals Group     BP 135/86     Pulse Rate 60     Resp 16     Temp 97.8 F (36.6 C)     Temp src      SpO2 98 %     Weight      Height      Head Circumference      Peak Flow      Pain Score 2     Pain Loc      Pain Edu?      Excl. in GC?    No data found.  Updated Vital Signs BP 135/86   Pulse 60   Temp 97.8 F (36.6 C)   Resp 16   LMP 07/05/2017 (Approximate)   SpO2 98%   Visual Acuity Right Eye Distance:   Left Eye Distance:   Bilateral Distance:    Right Eye Near:   Left Eye Near:    Bilateral Near:     Physical Exam Vitals and nursing note reviewed.  Constitutional:      General: She is not in acute distress.    Appearance: Normal appearance. She is normal weight. She is not ill-appearing, toxic-appearing or diaphoretic.  Cardiovascular:     Rate and Rhythm: Normal rate and regular rhythm.     Pulses: Normal pulses.     Heart sounds: Normal heart sounds. No murmur. No friction rub. No gallop.   Pulmonary:     Effort: Pulmonary effort is normal. No respiratory distress.     Breath sounds: Normal breath sounds. No stridor. No wheezing, rhonchi or rales.  Chest:     Chest wall: No tenderness.  Musculoskeletal:        General: Tenderness present.     Right elbow: Normal.     Left elbow: Tenderness present.     Comments: The left elbow is without any obvious asymmetry or deformity when compared to the right elbow  no obvious vascular, ecchymosis or soft tissue swelling.  Tenderness to palpation of the clecranon fossa.  Normal flexion, extension, supination, pronation.   Normal muscle strength.  Neurovascular status intact.  Neurological:     Mental Status: She is alert.      UC Treatments / Results  Labs (all labs ordered are listed, but only abnormal results are displayed) Labs Reviewed - No data to display  EKG   Radiology No results found.  Procedures Procedures (including critical care time)  Medications Ordered in UC Medications - No data to display  Initial Impression / Assessment and Plan / UC Course  I have reviewed the triage vital signs and the nursing notes.  Pertinent labs & imaging results that were available during my care of the patient were reviewed by me and considered in my medical decision making (see chart for details).    Patient is stable at discharge.  Symptoms is likely from elbow tendinitis.  Will prescribe prednisone.  Was advised to continue to take Tylenol as needed for pain.  Final Clinical Impressions(s) / UC Diagnoses   Final diagnoses:  Left elbow tendinitis     Discharge Instructions     Follow RICE instruction that is attached Take OTC Tylenol as needed for pain Prednisone was prescribed to decrease inflammation Follow-up with PCP Return or go to ED for worsening of symptoms    ED Prescriptions    Medication Sig Dispense Auth. Provider   predniSONE (DELTASONE) 10 MG tablet Take 2 tablets (20 mg total) by mouth daily. 15 tablet Avegno, Darrelyn Hillock, FNP     PDMP not reviewed this encounter.   Emerson Monte, FNP 06/24/19 1031

## 2019-07-12 ENCOUNTER — Ambulatory Visit: Payer: Self-pay | Admitting: Internal Medicine

## 2019-07-12 ENCOUNTER — Other Ambulatory Visit: Payer: Self-pay

## 2019-07-12 ENCOUNTER — Encounter: Payer: Self-pay | Admitting: Internal Medicine

## 2019-12-23 ENCOUNTER — Other Ambulatory Visit: Payer: Self-pay | Admitting: Cardiology

## 2019-12-23 NOTE — Telephone Encounter (Signed)
Rx has been sent to the pharmacy electronically. ° °

## 2020-01-22 ENCOUNTER — Emergency Department (HOSPITAL_COMMUNITY)
Admission: EM | Admit: 2020-01-22 | Discharge: 2020-01-22 | Disposition: A | Discharge status: Home / Self Care | Payer: Self-pay | Attending: Emergency Medicine | Admitting: Emergency Medicine

## 2020-01-22 ENCOUNTER — Encounter (HOSPITAL_COMMUNITY): Payer: Self-pay | Admitting: Emergency Medicine

## 2020-01-22 ENCOUNTER — Other Ambulatory Visit: Payer: Self-pay

## 2020-01-22 DIAGNOSIS — Z79899 Other long term (current) drug therapy: Secondary | ICD-10-CM | POA: Insufficient documentation

## 2020-01-22 DIAGNOSIS — K219 Gastro-esophageal reflux disease without esophagitis: Secondary | ICD-10-CM | POA: Insufficient documentation

## 2020-01-22 DIAGNOSIS — Z711 Person with feared health complaint in whom no diagnosis is made: Secondary | ICD-10-CM

## 2020-01-22 DIAGNOSIS — Z87891 Personal history of nicotine dependence: Secondary | ICD-10-CM | POA: Insufficient documentation

## 2020-01-22 DIAGNOSIS — Z202 Contact with and (suspected) exposure to infections with a predominantly sexual mode of transmission: Secondary | ICD-10-CM | POA: Insufficient documentation

## 2020-01-22 DIAGNOSIS — R300 Dysuria: Secondary | ICD-10-CM | POA: Insufficient documentation

## 2020-01-22 DIAGNOSIS — I10 Essential (primary) hypertension: Secondary | ICD-10-CM | POA: Insufficient documentation

## 2020-01-22 DIAGNOSIS — N898 Other specified noninflammatory disorders of vagina: Secondary | ICD-10-CM | POA: Insufficient documentation

## 2020-01-22 LAB — URINALYSIS, ROUTINE W REFLEX MICROSCOPIC
Bilirubin Urine: NEGATIVE
Glucose, UA: NEGATIVE mg/dL
Ketones, ur: NEGATIVE mg/dL
Nitrite: NEGATIVE
Protein, ur: NEGATIVE mg/dL
Specific Gravity, Urine: 1.012 (ref 1.005–1.030)
pH: 5 (ref 5.0–8.0)

## 2020-01-22 LAB — WET PREP, GENITAL
Clue Cells Wet Prep HPF POC: NONE SEEN
Sperm: NONE SEEN
Trich, Wet Prep: NONE SEEN
Yeast Wet Prep HPF POC: NONE SEEN

## 2020-01-22 LAB — HIV ANTIBODY (ROUTINE TESTING W REFLEX): HIV Screen 4th Generation wRfx: NONREACTIVE

## 2020-01-22 MED ORDER — FLUCONAZOLE 100 MG PO TABS
100.0000 mg | ORAL_TABLET | Freq: Once | ORAL | Status: AC
  Administered 2020-01-22: 100 mg via ORAL
  Filled 2020-01-22: qty 1

## 2020-01-22 MED ORDER — DOXYCYCLINE HYCLATE 100 MG PO TABS
100.0000 mg | ORAL_TABLET | Freq: Once | ORAL | Status: AC
  Administered 2020-01-22: 100 mg via ORAL
  Filled 2020-01-22: qty 1

## 2020-01-22 MED ORDER — CEFTRIAXONE SODIUM 500 MG IJ SOLR
500.0000 mg | Freq: Once | INTRAMUSCULAR | Status: AC
  Administered 2020-01-22: 500 mg via INTRAMUSCULAR
  Filled 2020-01-22: qty 500

## 2020-01-22 MED ORDER — STERILE WATER FOR INJECTION IJ SOLN
INTRAMUSCULAR | Status: AC
  Filled 2020-01-22: qty 10

## 2020-01-22 MED ORDER — DOXYCYCLINE HYCLATE 100 MG PO CAPS: 100.0000 mg | ORAL_CAPSULE | Freq: Two times a day (BID) | ORAL | 0 refills | Status: DC

## 2020-01-22 NOTE — Discharge Instructions (Addendum)
You will be further called for the results  Return for new worsening symptoms

## 2020-01-22 NOTE — ED Triage Notes (Signed)
Pt c/o pain itching and burning with urination. Pt states she think she has a UTI or yeast infection.

## 2020-01-22 NOTE — ED Provider Notes (Signed)
[facility] EMERGENCY DEPARTMENT Provider Note   CSN: [id number] Arrival date & time: 01/22/20  1036     History Chief Complaint  Patient presents with  . Dysuria    Donna Long is a 49 y.o. female with past medical history significant for SVT, fatty liver, hypertension who presents for evaluation of dysuria and vaginal pruritus.  Had new sexual partner in which condom slipped off.  Has some burning with urination.  She has noted a white discharge which she thinks is a yeast infection.  No prior history of STDs.  No rashes or lesions.  Denies fever, chills, nausea,, chest pain, shortness of breath, abdominal pain, pelvic pain, diarrhea constipation.  Denies additional aggravating or alleviating factors.  History obtained from patient and past medical records.  No interpreter used.  HPI     Past Medical History:  Diagnosis Date  . Anemia    d/t gynecological issues  . Anxiety   . Arthritis   . Back injury   . Blood transfusion without reported diagnosis   . Chronic back pain   . Depression   . Dysrhythmia    HX SVT  . Fatty liver   . GERD (gastroesophageal reflux disease)   . Hypertension   . Sleep apnea   . SVT (supraventricular tachycardia) [facility])     Patient Active Problem List   Diagnosis Date Noted  . SVT (supraventricular tachycardia) (HCC) 11/16/2018  . S/P vaginal hysterectomy 10/17/2018  . Chest pain 06/30/2018  . Hyperglycemia 06/30/2018  . 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 Anemia 11/04/2016  . Anxiety 11/04/2016    Past Surgical History:  Procedure Laterality Date  . DILITATION & CURRETTAGE/HYSTROSCOPY WITH NOVASURE ABLATION N/A 08/16/2017   Procedure: DILATATION & CURETTAGE/HYSTEROSCOPY WITH MINERVA  ENDOMETRIAL ABLATION;  Surgeon: Lazaro Arms, MD;  Location: [facility];  Service: Gynecology;  Laterality: N/A;  . SVT ABLATION N/A 11/16/2018   Procedure: SVT ABLATION;  Surgeon: Marinus Maw, MD;  Location: [facility];   Service: Cardiovascular;  Laterality: N/A;  . TUBAL LIGATION    . VAGINAL HYSTERECTOMY N/A 10/17/2018   Procedure: HYSTERECTOMY VAGINAL;  Surgeon: Lazaro Arms, MD;  Location: [facility];  Service: Gynecology;  Laterality: N/A;  . WRIST SURGERY Right      OB History    Gravida  4   Para  4   Term      Preterm      AB      Living  4     SAB      IAB      Ectopic      Multiple      Live Births              Family History  Problem Relation Age of Onset  . Hypertension Mother   . Heart disease Mother   . COPD Father   . Hypertension Maternal Aunt   . Diabetes Maternal Grandmother   . Kidney disease Sister   . Colon cancer Neg Hx   . Esophageal cancer Neg Hx   . Rectal cancer Neg Hx   . Stomach cancer Neg Hx     Social History   Tobacco Use  . Smoking status: Former Smoker    Packs/day: 1.00    Years: 33.00    Pack years: 33.00    Types: Cigarettes    Quit date: 04/02/2008    Years since quitting: 11.8  . Smokeless tobacco: Never Used  Vaping Use  . Vaping Use: Former  Substance Use Topics  . Alcohol use: No  . Drug use: No    Home Medications Prior to Admission medications   Medication Sig Start Date End Date Taking? Authorizing Provider  doxycycline (VIBRAMYCIN) 100 MG capsule Take 1 capsule (100 mg total) by mouth 2 (two) times daily. 01/22/20   Nashalie Sallis A, PA-C  esomeprazole (NEXIUM) 20 MG packet Take 20 mg by mouth daily before breakfast. OTC    [provider]  flecainide (TAMBOCOR) 50 MG tablet Take 1 tablet (50 mg total) by mouth 2 (two) times daily. 05/29/19   Marinus Maw, MD  metoprolol succinate (TOPROL-XL) 25 MG 24 hr tablet Take 1 tablet (25 mg total) by mouth as needed. 12/23/19   Leone Brand, NP  Multiple Vitamins-Minerals (ALIVE WOMENS GUMMY PO) Take 2 each by mouth daily. Chewables    [provider]    Allergies    Kenalog  [triamcinolone] and Clindamycin/lincomycin  Review of Systems   Review  of Systems  Constitutional: Negative.   HENT: Negative.   Respiratory: Negative.   Cardiovascular: Negative.   Gastrointestinal: Negative.   Genitourinary: Positive for dysuria and vaginal discharge. Negative for decreased urine volume, difficulty urinating, enuresis, flank pain, frequency, genital sores, hematuria, menstrual problem, pelvic pain, urgency, vaginal bleeding and vaginal pain.  Musculoskeletal: Negative.   Skin: Negative.   Neurological: Negative.   All other systems reviewed and are negative.   Physical Exam Updated Vital Signs BP (!) 144/80 (BP Location: Right Arm)   Pulse 60   Temp 98 F (36.7 C) (Oral)   Resp 17   Ht 5\' 6"  (1.676 m)   Wt 127 kg   LMP 07/05/2017 (Approximate)   SpO2 100%   BMI 45.19 kg/m   Physical Exam Vitals and nursing note reviewed. Exam conducted with a chaperone present.  Constitutional:      General: She is not in acute distress.    Appearance: She is well-developed and well-nourished. She is not ill-appearing, toxic-appearing or diaphoretic.  HENT:     Head: Normocephalic and atraumatic.     Nose: Nose normal.     Mouth/Throat:     Mouth: Mucous membranes are moist.  Eyes:     Pupils: Pupils are equal, round, and reactive to light.  Cardiovascular:     Rate and Rhythm: Normal rate.     Pulses: Normal pulses and intact distal pulses.     Heart sounds: Normal heart sounds.  Pulmonary:     Effort: Pulmonary effort is normal. No respiratory distress.     Breath sounds: Normal breath sounds.  Abdominal:     General: Bowel sounds are normal. There is no distension.  Genitourinary:    Comments: Normal appearing external female genitalia without rashes or lesions, normal vaginal epithelium. Erythematous vaginal vault. There is no bleeding noted at the os. No dor. Bimanual: No CMT, nontender.  No palpable adnexal masses or tenderness. Uterus midline and not fixed. Rectovaginal exam was deferred. No cystocele or rectocele noted. No  pelvic lymphadenopathy noted. Wet prep was obtained.  Cultures for gonorrhea and chlamydia collected. Exam performed with chaperone in room. Musculoskeletal:        General: Normal range of motion.     Cervical back: Normal range of motion.  Skin:    General: Skin is warm and dry.     Capillary Refill: Capillary refill takes less than 2 seconds.     Comments: No edema,  erythema or warmth.  Neurological:     General: No focal deficit present.     Mental Status: She is alert and oriented to person, place, and time.  Psychiatric:        Mood and Affect: Mood and affect normal.    ED Results / Procedures / Treatments   Labs (all labs ordered are listed, but only abnormal results are displayed) Labs Reviewed  WET PREP, GENITAL - Abnormal; Notable for the following components:      Result Value   WBC, Wet Prep HPF POC FEW (*)    All other components within normal limits  URINALYSIS, ROUTINE W REFLEX MICROSCOPIC - Abnormal; Notable for the following components:   APPearance HAZY (*)    Hgb urine dipstick SMALL (*)    Leukocytes,Ua LARGE (*)    Bacteria, UA RARE (*)    All other components within normal limits  URINE CULTURE  RPR  HIV ANTIBODY (ROUTINE TESTING W REFLEX)  GC/CHLAMYDIA PROBE AMP ([place]) NOT AT [facility]    EKG None  Radiology No results found.  Procedures Procedures (including critical care time)  Medications Ordered in ED Medications  sterile water (preservative free) injection (has no administration in time range)  cefTRIAXone (ROCEPHIN) injection 500 mg (500 mg Intramuscular Given 01/22/20 1356)  doxycycline (VIBRA-TABS) tablet 100 mg (100 mg Oral Given 01/22/20 1356)  fluconazole (DIFLUCAN) tablet 100 mg (100 mg Oral Given 01/22/20 1356)    ED Course  I have reviewed the triage vital signs and the nursing notes.  Pertinent labs & imaging results that were available during my care of the patient were reviewed by me and considered in my medical  decision making (see chart for details).  49 year old presents for evaluation of possible exposure to STD.  Recent sexual intercourse with condom slipped off.  Has been having dysuria vaginal pruritus as well as white vaginal discharge.  Heart and lungs clear.  Abdomen soft, nontender.  Negative CVA tap no rashes or lesions.  GU exam without CMT tenderness, adnexal tenderness.  I low suspicion for TOA, torsion, PID.  She does have erythematous vaginal vault.  No discharge.  UA large leuks, rare bacteria WBC with few WBC however no yeast or trichomoniasis. GC, chlamydia pending HIV, RPR pending  Patient reassessed.  Continues have benign abdominal exam.  Denies pelvic pain.  He requests empiric antibiotics for possible STDs.  Does have history of yeast infections after antibiotics, subsequently given Diflucan here.  Will DC home with abx.   Pt presents with concerns for possible STD.  Pt understands that they have GC/Chlamydia cultures pending and that they will need to inform all sexual partners if results return positive. Pt not concerning for PID because hemodynamically stable and no cervical motion tenderness on pelvic exam. Pt has been advised to not drink alcohol while on this medication.  Patient to be discharged with instructions to follow up with OBGYN/PCP. Discussed importance of using protection when sexually active.   The patient has been appropriately medically screened and/or stabilized in the ED. I have low suspicion for any other emergent medical condition which would require further screening, evaluation or treatment in the ED or require inpatient management.  Patient is hemodynamically stable and in no acute distress.  Patient able to ambulate in department prior to ED.  Evaluation does not show acute pathology that would require ongoing or additional emergent interventions while in the emergency department or further inpatient treatment.  I have discussed the diagnosis with  the  patient and answered all questions.  Pain is been managed while in the emergency department and patient has no further complaints prior to discharge.  Patient is comfortable with plan discussed in room and is stable for discharge at this time.  I have discussed strict return precautions for returning to the emergency department.  Patient was encouraged to follow-up with PCP/specialist refer to at discharge.    MDM Rules/Calculators/A&P                           Final Clinical Impression(s) / ED Diagnoses Final diagnoses:  Dysuria  Vaginal discharge  Concern about STD in female without diagnosis    Rx / DC Orders ED Discharge Orders         Ordered    doxycycline (VIBRAMYCIN) 100 MG capsule  2 times daily        01/22/20 1355           Ilynn Stauffer A, PA-C 01/22/20 1432    Blane OharaZavitz, Joshua, MD 01/25/20 0009

## 2020-01-23 LAB — GC/CHLAMYDIA PROBE AMP (~~LOC~~) NOT AT ARMC
Chlamydia: NEGATIVE
Comment: NEGATIVE
Comment: NORMAL
Neisseria Gonorrhea: NEGATIVE

## 2020-01-23 LAB — URINE CULTURE

## 2020-01-23 LAB — RPR: RPR Ser Ql: NONREACTIVE

## 2020-05-24 IMAGING — CT CT HEAD WITHOUT CONTRAST
3 series · 16 of 47 positions shown, 19 images · non-contrast
Comparison: None.

CLINICAL DATA: Severe headaches

EXAM:
CT HEAD WITHOUT CONTRAST
TECHNIQUE: Contiguous axial images were obtained from the base of the skull
through the vertex without intravenous contrast.

[Series 2: head w o · axial · 0.43mm/px · z∈[+40,+175]mm · 10 of 33 slices shown, 13 images]
[im 3/33  brain]
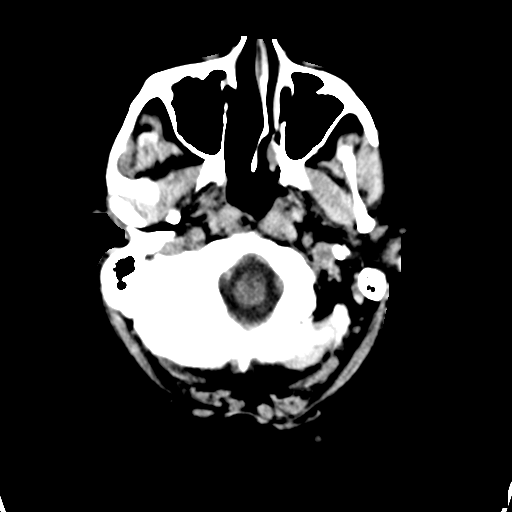
[im 3/33  bone]
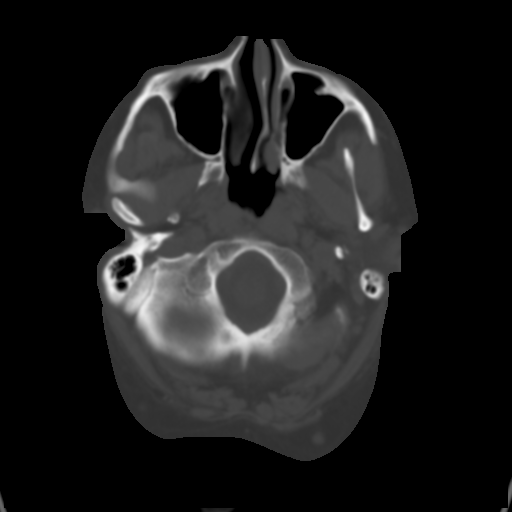
[im 6/33  brain]
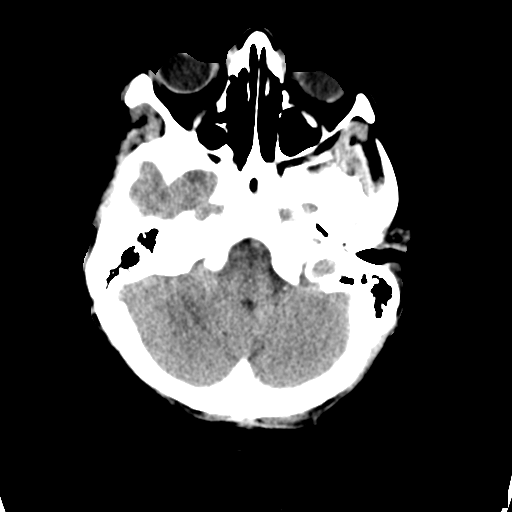
[im 9/33  brain]
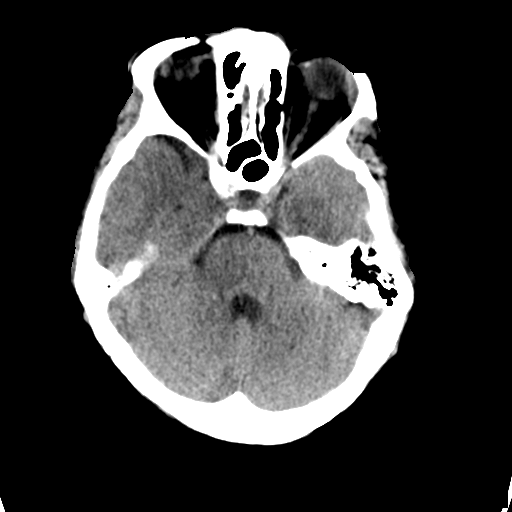
[im 12/33  brain]
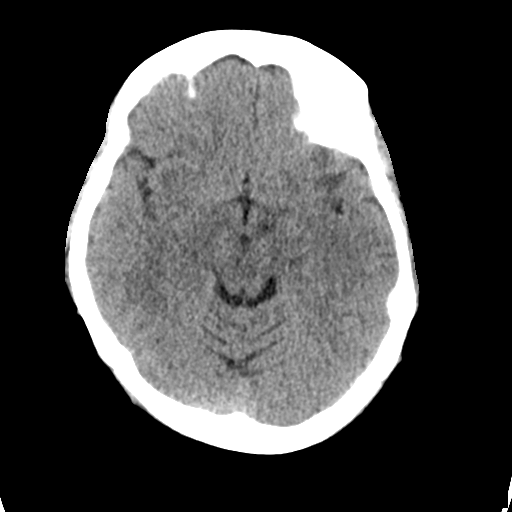
[im 15/33  brain]
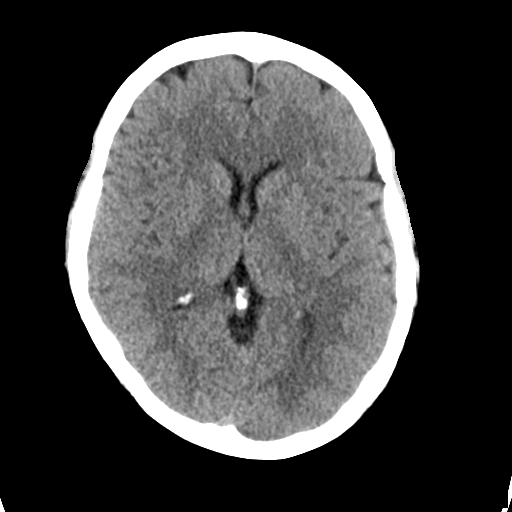
[im 15/33  bone]
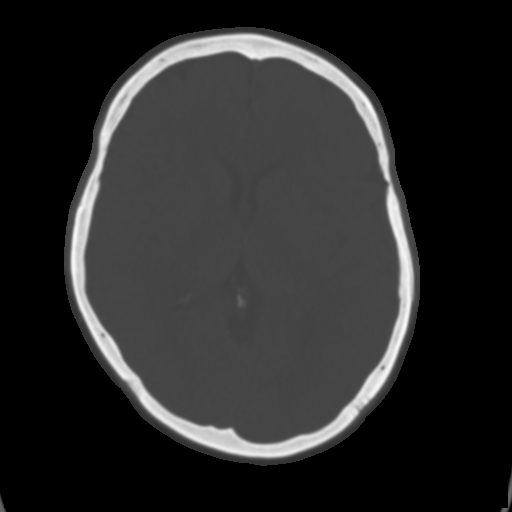
[im 18/33  brain]
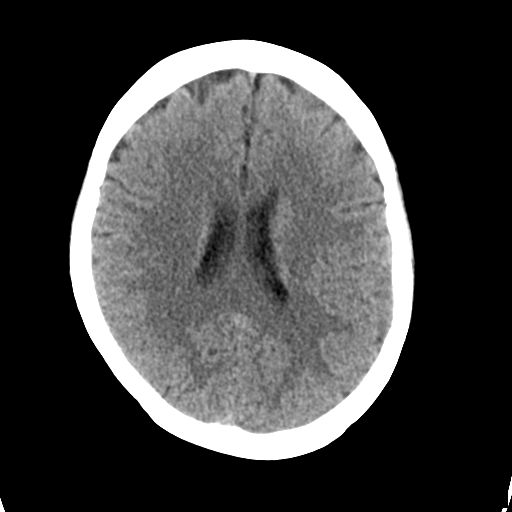
[im 21/33  brain]
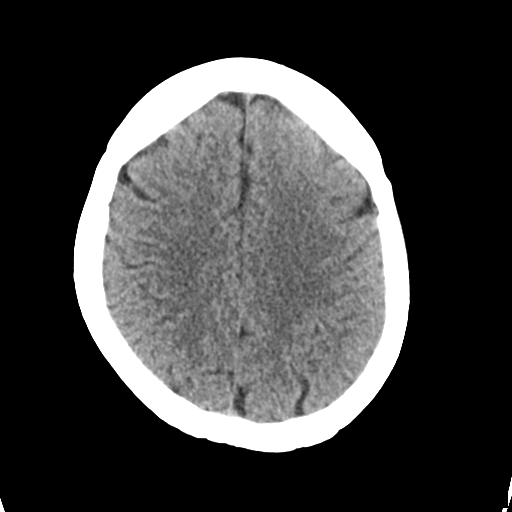
[im 25/33  brain]
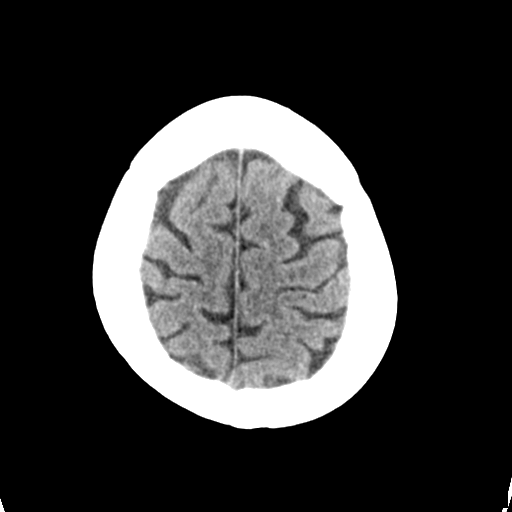
[im 27/33  brain]
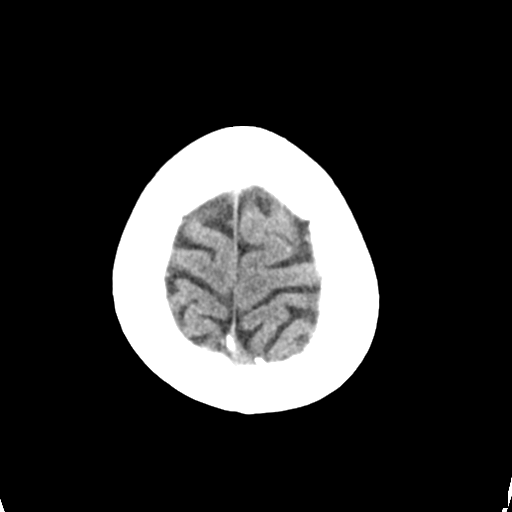
[im 27/33  bone]
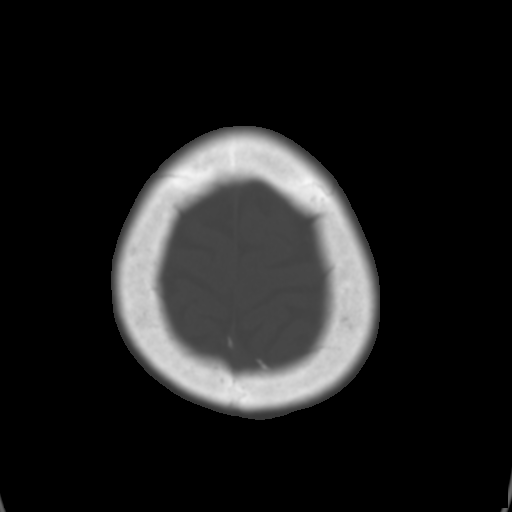
[im 30/33  brain]
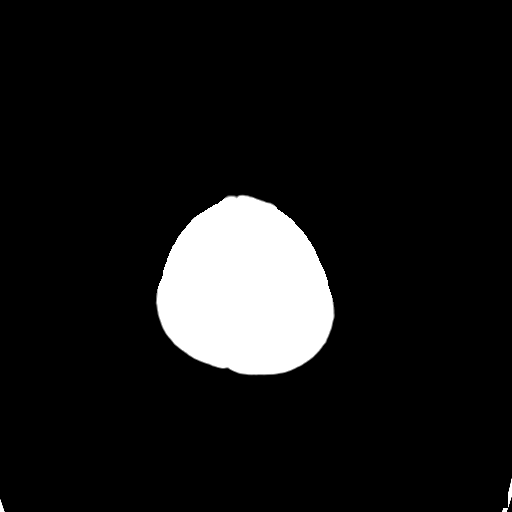

[Series 4: coronal soft · coronal · 0.34mm/px · 3 of 72 slices shown]
[im 24/72  brain]
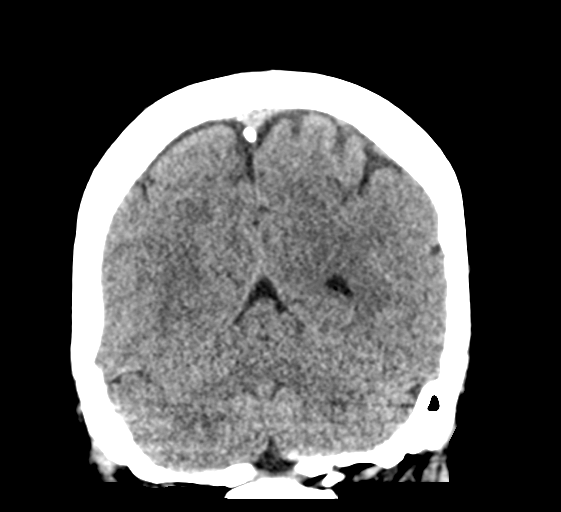
[im 32/72  brain]
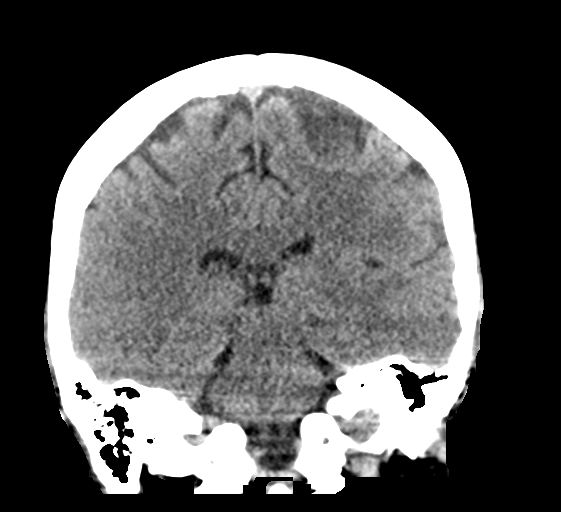
[im 40/72  brain]
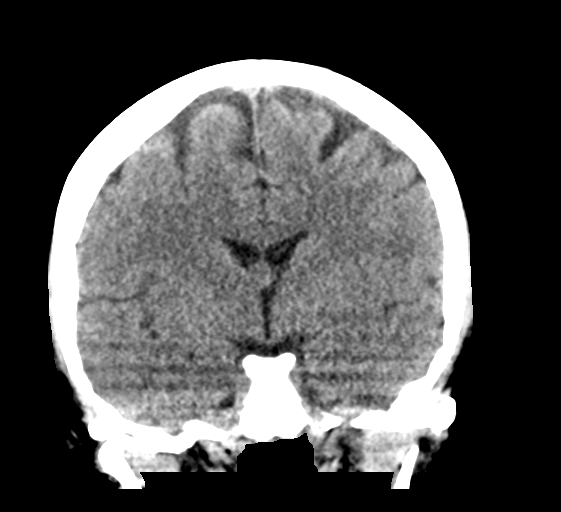

[Series 5: sagittal soft · sagittal · 0.35mm/px · 3 of 62 slices shown]
[im 21/62  brain]
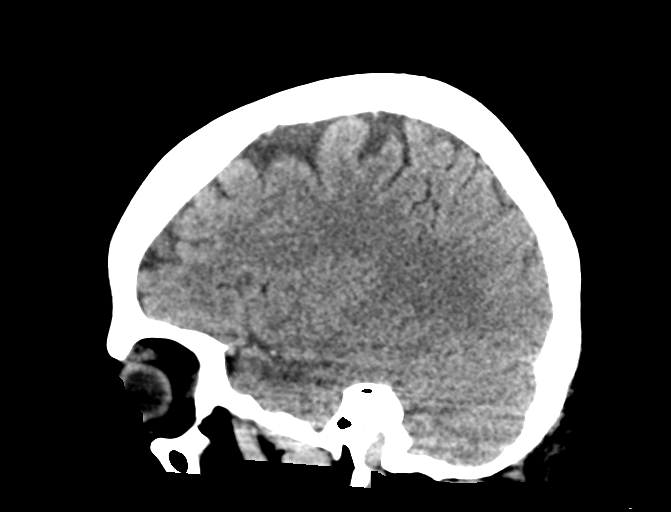
[im 31/62  brain]
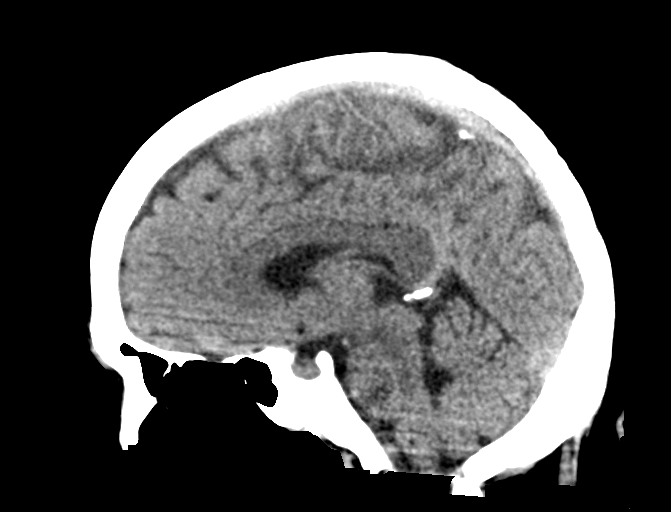
[im 41/62  brain]
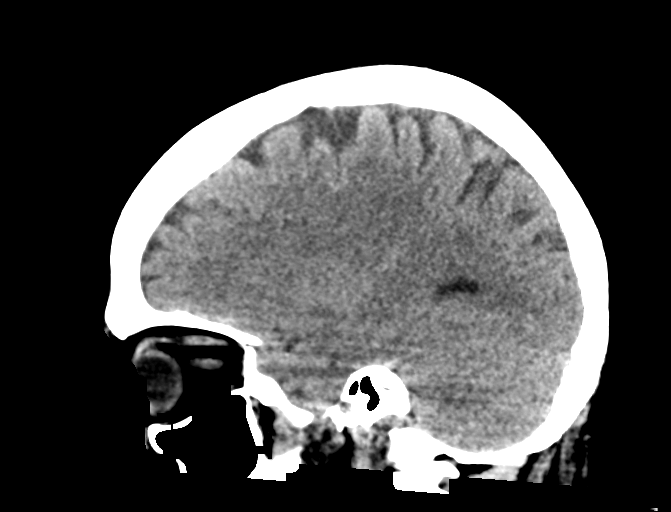

[16 of 47 positions shown; findings below may reference images not displayed]

FINDINGS: Brain: No evidence of acute infarction, hemorrhage, hydrocephalus,
extra-axial collection or mass lesion/mass effect.

Vascular: No hyperdense vessel or unexpected calcification.

Skull: Normal. Negative for fracture or focal lesion.

Sinuses/Orbits: No acute finding.

Other: None.
IMPRESSION: No acute intracranial abnormality noted.

## 2020-05-24 IMAGING — DX CHEST - 2 VIEW
2 series · 2 of 2 positions shown · non-contrast
Comparison: 03/13/2017

CLINICAL DATA: Cardiac palpitations

EXAM:
CHEST - 2 VIEW

[chest pa]
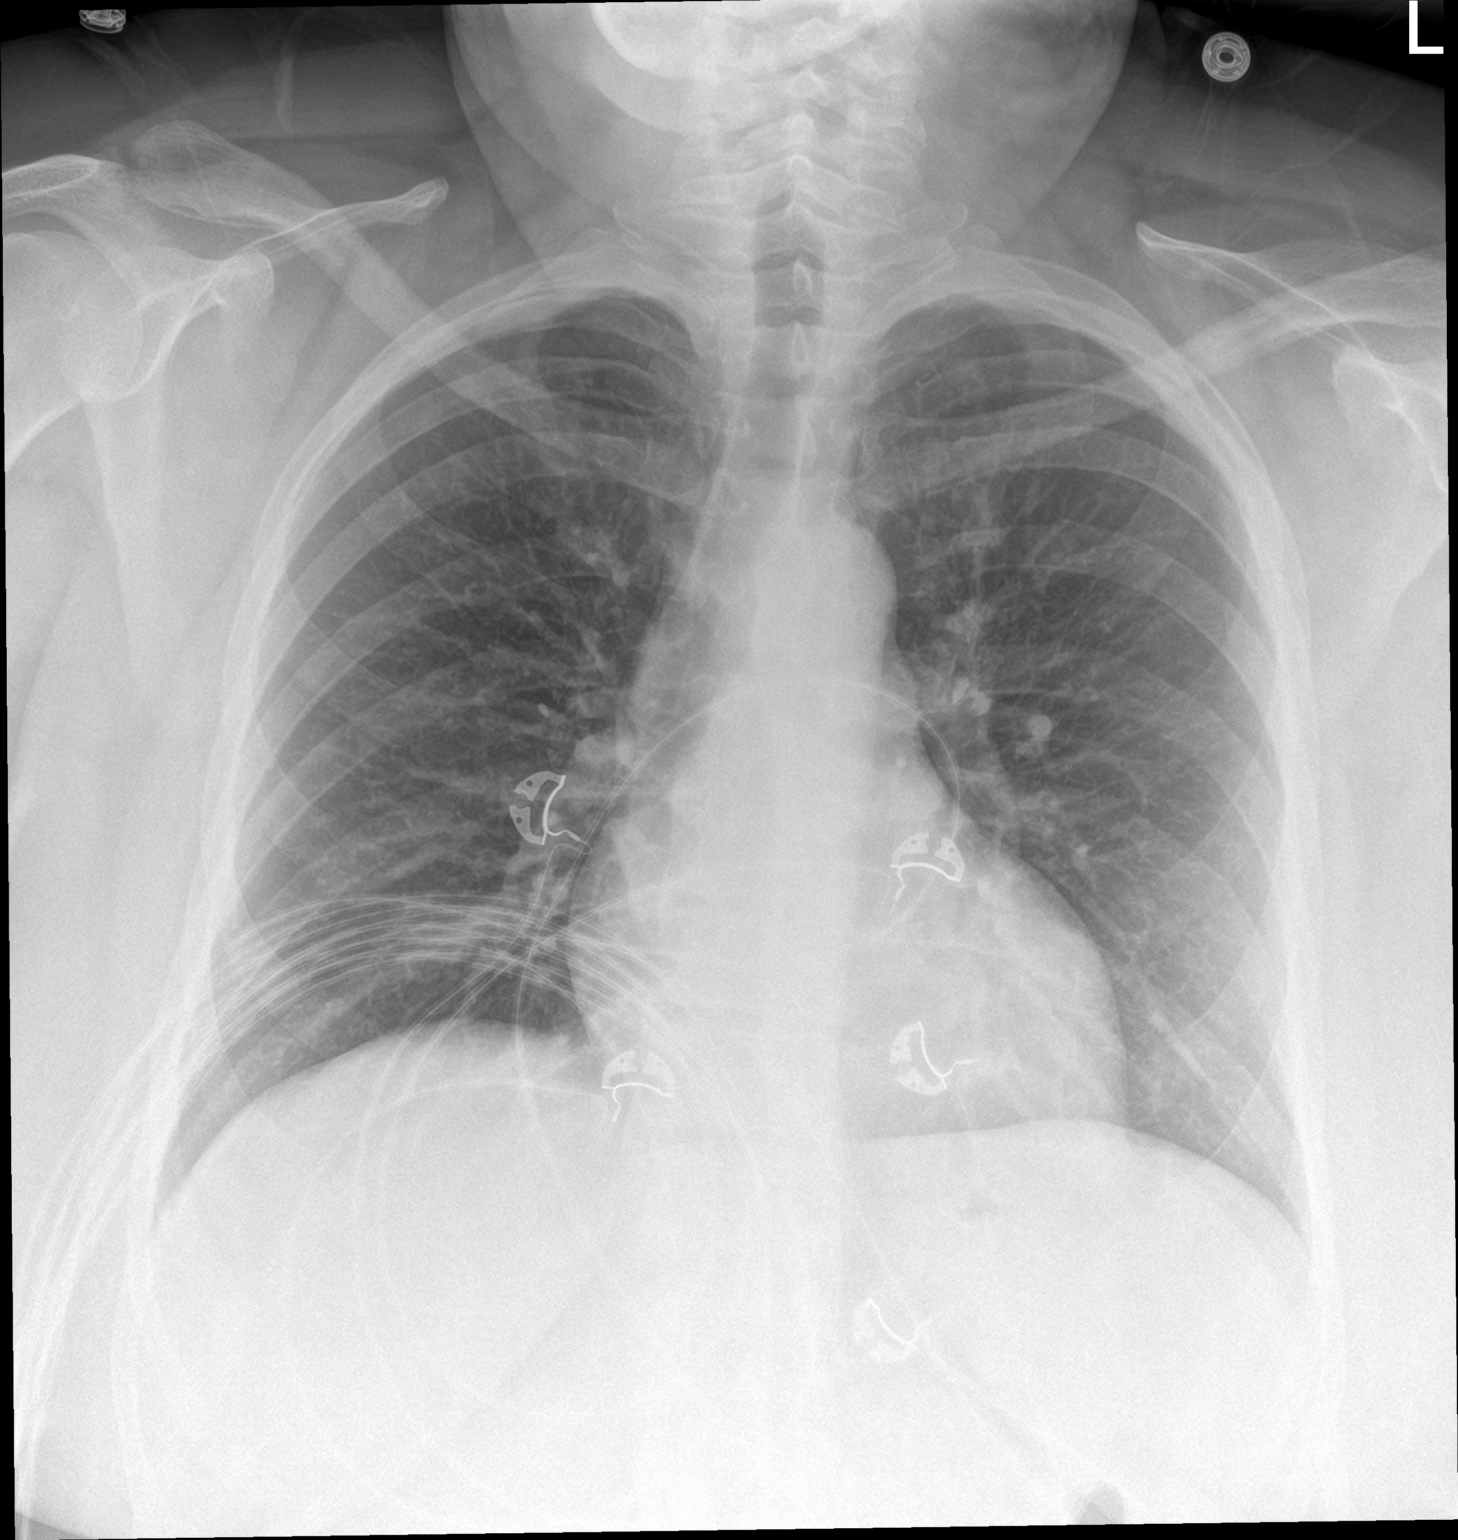

[chest lat]
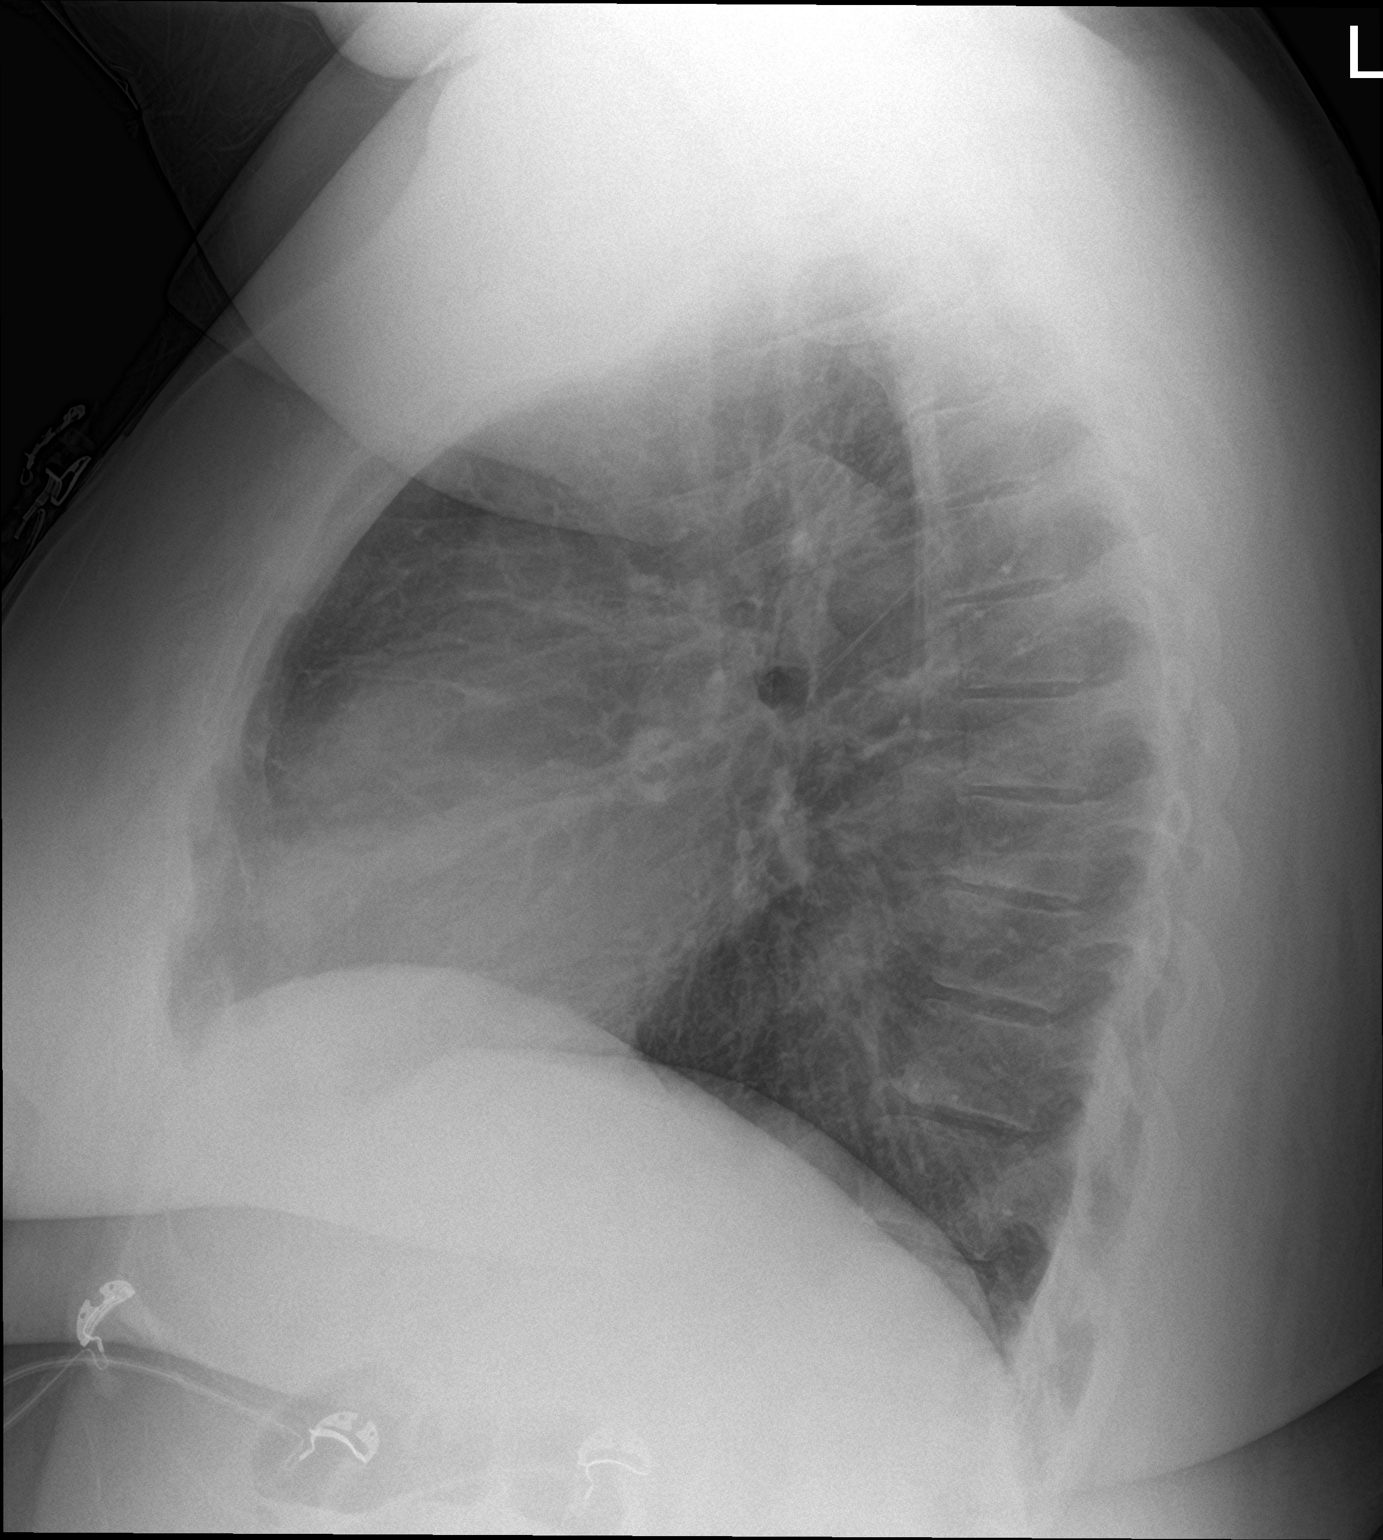

[2 of 2 positions shown; findings below may reference images not displayed]

FINDINGS: The heart size and mediastinal contours are within normal limits.
Both lungs are clear. The visualized skeletal structures are
unremarkable.
IMPRESSION: No active cardiopulmonary disease.

## 2020-06-02 ENCOUNTER — Emergency Department (HOSPITAL_COMMUNITY): Admission: EM | Admit: 2020-06-02 | Discharge: 2020-06-02 | Discharge status: Against Medical Advice | Payer: Self-pay

## 2020-06-02 ENCOUNTER — Other Ambulatory Visit: Payer: Self-pay

## 2020-07-09 ENCOUNTER — Other Ambulatory Visit: Payer: Self-pay | Admitting: Cardiology

## 2020-07-10 NOTE — Telephone Encounter (Signed)
This is a [place] pt.  °

## 2020-09-08 ENCOUNTER — Emergency Department (HOSPITAL_COMMUNITY)
Admission: EM | Admit: 2020-09-08 | Discharge: 2020-09-09 | Disposition: A | Discharge status: Home / Self Care | Payer: Self-pay | Attending: Emergency Medicine | Admitting: Emergency Medicine

## 2020-09-08 ENCOUNTER — Other Ambulatory Visit: Payer: Self-pay

## 2020-09-08 ENCOUNTER — Encounter (HOSPITAL_COMMUNITY): Payer: Self-pay | Admitting: *Deleted

## 2020-09-08 DIAGNOSIS — I10 Essential (primary) hypertension: Secondary | ICD-10-CM | POA: Insufficient documentation

## 2020-09-08 DIAGNOSIS — Z87891 Personal history of nicotine dependence: Secondary | ICD-10-CM | POA: Insufficient documentation

## 2020-09-08 DIAGNOSIS — J329 Chronic sinusitis, unspecified: Secondary | ICD-10-CM | POA: Insufficient documentation

## 2020-09-08 DIAGNOSIS — R42 Dizziness and giddiness: Secondary | ICD-10-CM | POA: Insufficient documentation

## 2020-09-08 NOTE — ED Triage Notes (Signed)
C/o sinus infection with dental pain x 3 weeks

## 2020-09-08 NOTE — ED Triage Notes (Deleted)
C/o sinus infection with dental pain x 3 days

## 2020-09-09 LAB — CBG MONITORING, ED: Glucose-Capillary: 82 mg/dL (ref 70–99)

## 2020-09-09 MED ORDER — AMOXICILLIN-POT CLAVULANATE 875-125 MG PO TABS
1.0000 | ORAL_TABLET | Freq: Once | ORAL | Status: AC
  Administered 2020-09-09: 1 via ORAL
  Filled 2020-09-09: qty 1

## 2020-09-09 MED ORDER — AMOXICILLIN-POT CLAVULANATE 875-125 MG PO TABS: 1.0000 | ORAL_TABLET | Freq: Two times a day (BID) | ORAL | 0 refills | Status: DC

## 2020-09-09 NOTE — ED Provider Notes (Signed)
[facility] EMERGENCY DEPARTMENT Provider Note   CSN: [id number] Arrival date & time: 09/08/20  1745     History No chief complaint on file.   Donna Long is a 50 y.o. female.  Patient with a history of SVT, chronic pain, depression, sleep apnea presenting with "severe sinus infection".  Describes pain and pressure to her forehead, face, nose, ears for least 2 weeks progressively worsening.  She has chronic sinus issues and this feels similar to a sinus infection.  He has been using saline rinses and Flonase at home without relief.  Describes dizziness when she changes position is a lightheaded sensation without room spinning.  No chest pain or shortness of breath.  No thunderclap onset headache.  No visual changes.  No focal weakness, numbness or tingling.  Feels pressure in her ears, pressure in her face, pressure in her nose with lots of rhinorrhea and a sore throat.  Reports negative COVID test at home yesterday.  Is concerned that she needs antibiotics.  She has seen Dr. Suszanne Conners of ENT in the past. She denies any sudden onset severe headache.  She denies any visual changes.  No focal weakness, numbness or tingling.  No difficulty speaking or difficulty swallowing  The history is provided by the patient.      Past Medical History:  Diagnosis Date   Anemia    d/t gynecological issues   Anxiety    Arthritis    Back injury    Blood transfusion without reported diagnosis    Chronic back pain    Depression    Dysrhythmia    HX SVT   Fatty liver    GERD (gastroesophageal reflux disease)    Hypertension    Sleep apnea    SVT (supraventricular tachycardia) [facility])     Patient Active Problem List   Diagnosis Date Noted   SVT (supraventricular tachycardia) (HCC) 11/16/2018   S/P vaginal hysterectomy 10/17/2018   Chest pain 06/30/2018   Hyperglycemia 06/30/2018   Protrusion of lumbar intervertebral disc 05/04/2017   Anemia 11/04/2016   Anxiety 11/04/2016    Past Surgical  History:  Procedure Laterality Date   DILITATION & CURRETTAGE/HYSTROSCOPY WITH NOVASURE ABLATION N/A 08/16/2017   Procedure: DILATATION & CURETTAGE/HYSTEROSCOPY WITH MINERVA  ENDOMETRIAL ABLATION;  Surgeon: Lazaro Arms, MD;  Location: [facility] ORS;  Service: Gynecology;  Laterality: N/A;   SVT ABLATION N/A 11/16/2018   Procedure: SVT ABLATION;  Surgeon: Marinus Maw, MD;  Location: [facility] INVASIVE CV LAB;  Service: Cardiovascular;  Laterality: N/A;   TUBAL LIGATION     VAGINAL HYSTERECTOMY N/A 10/17/2018   Procedure: HYSTERECTOMY VAGINAL;  Surgeon: Lazaro Arms, MD;  Location: [facility] ORS;  Service: Gynecology;  Laterality: N/A;   WRIST SURGERY Right      OB History     Gravida  4   Para  4   Term      Preterm      AB      Living  4      SAB      IAB      Ectopic      Multiple      Live Births              Family History  Problem Relation Age of Onset   Hypertension Mother    Heart disease Mother    COPD Father    Hypertension Maternal Aunt    Diabetes Maternal Grandmother    Kidney disease Sister  Colon cancer Neg Hx    Esophageal cancer Neg Hx    Rectal cancer Neg Hx    Stomach cancer Neg Hx     Social History   Tobacco Use   Smoking status: Former    Packs/day: 1.00    Years: 33.00    Pack years: 33.00    Types: Cigarettes    Quit date: 04/02/2008    Years since quitting: 12.4   Smokeless tobacco: Never  Vaping Use   Vaping Use: Former  Substance Use Topics   Alcohol use: No   Drug use: No    Home Medications Prior to Admission medications   Medication Sig Start Date End Date Taking? Authorizing Provider  doxycycline (VIBRAMYCIN) 100 MG capsule Take 1 capsule (100 mg total) by mouth 2 (two) times daily. 01/22/20   Henderly, Britni A, PA-C  esomeprazole (NEXIUM) 20 MG packet Take 20 mg by mouth daily before breakfast. OTC    [provider]  flecainide (TAMBOCOR) 50 MG tablet Take 1 tablet (50 mg total) by mouth 2 (two) times daily.  05/29/19   Marinus Maw, MD  metoprolol succinate (TOPROL-XL) 25 MG 24 hr tablet TAKE 1 TABLET (25 MG TOTAL) BY MOUTH AS NEEDED. 07/10/20   Marinus Maw, MD  Multiple Vitamins-Minerals (ALIVE WOMENS GUMMY PO) Take 2 each by mouth daily. Chewables    [provider]    Allergies    Kenalog  [triamcinolone] and Clindamycin/lincomycin  Review of Systems   Review of Systems  Constitutional:  Positive for fatigue. Negative for activity change, appetite change and fever.  HENT:  Positive for congestion, ear pain, nosebleeds, sinus pressure, sinus pain and sore throat. Negative for voice change.   Respiratory:  Negative for cough, chest tightness, shortness of breath and stridor.   Cardiovascular:  Negative for chest pain.  Gastrointestinal:  Negative for abdominal pain, nausea and vomiting.  Genitourinary:  Negative for dysuria and hematuria.  Musculoskeletal:  Negative for arthralgias and myalgias.  Skin:  Negative for rash.  Neurological:  Positive for dizziness, light-headedness and headaches. Negative for weakness.   all other systems are negative except as noted in the HPI and PMH.   Physical Exam Updated Vital Signs BP (!) 150/82   Pulse 68   Temp 98.3 F (36.8 C) (Oral)   Resp 20   LMP 07/05/2017 (Approximate)   SpO2 99%   Physical Exam Vitals and nursing note reviewed.  Constitutional:      General: She is not in acute distress.    Appearance: She is well-developed. She is obese.  HENT:     Head: Normocephalic and atraumatic.     Right Ear: Tympanic membrane normal.     Left Ear: Tympanic membrane normal.     Ears:     Comments: Serous effusions bilaterally, no tragus or mastoid pain    Nose: Congestion present.     Mouth/Throat:     Pharynx: No oropharyngeal exudate.     Comments: Frontal and maxillary sinus tenderness bilaterally Eyes:     Conjunctiva/sclera: Conjunctivae normal.     Pupils: Pupils are equal, round, and reactive to light.  Neck:      Comments: No meningismus. Cardiovascular:     Rate and Rhythm: Normal rate and regular rhythm.     Heart sounds: Normal heart sounds. No murmur heard. Pulmonary:     Effort: Pulmonary effort is normal. No respiratory distress.     Breath sounds: Normal breath sounds.  Abdominal:  Palpations: Abdomen is soft.     Tenderness: There is no abdominal tenderness. There is no guarding or rebound.  Musculoskeletal:        General: No tenderness. Normal range of motion.     Cervical back: Normal range of motion and neck supple.  Skin:    General: Skin is warm.  Neurological:     Mental Status: She is alert and oriented to person, place, and time.     Cranial Nerves: No cranial nerve deficit.     Motor: No abnormal muscle tone.     Coordination: Coordination normal.     Comments: CN 2-12 intact, no ataxia on finger to nose, no nystagmus, 5/5 strength throughout, no pronator drift, Romberg negative, normal gait.   Psychiatric:        Behavior: Behavior normal.    ED Results / Procedures / Treatments   Labs (all labs ordered are listed, but only abnormal results are displayed) Labs Reviewed  CBG MONITORING, ED    EKG None  Radiology No results found.  Procedures Procedures   Medications Ordered in ED Medications  amoxicillin-clavulanate (AUGMENTIN) 875-125 MG per tablet 1 tablet (has no administration in time range)    ED Course  I have reviewed the triage vital signs and the nursing notes.  Pertinent labs & imaging results that were available during my care of the patient were reviewed by me and considered in my medical decision making (see chart for details).    MDM Rules/Calculators/A&P                          Sinus pain and pressure for the past several weeks.  No fever.  No neurological deficits.  Sinus pressure similar to previous sinus issues. No fever.  No neurological deficits.  No thunderclap onset headache.  Low suspicion for subarachnoid hemorrhage,  meningitis, temporal arteritis.  Patient will be treated with nasal steroids, antibiotics and ENT follow-up. Oral hydration at home.  Return precautions discussed Final Clinical Impression(s) / ED Diagnoses Final diagnoses:  Recurrent sinusitis    Rx / DC Orders ED Discharge Orders     None        Fantasy Donald, Jeannett Senior, MD 09/09/20 [id number]

## 2020-09-09 NOTE — ED Notes (Signed)
Ambulatory in room. Says she is ready to go.

## 2020-09-09 NOTE — Discharge Instructions (Addendum)
Use her nasal saline, nasal steroids and take the antibiotics as prescribed.  Follow-up with Dr. Suszanne Conners.  Return to the ED if you develop new or worsening symptoms

## 2020-09-24 ENCOUNTER — Emergency Department (HOSPITAL_COMMUNITY)
Admission: EM | Admit: 2020-09-24 | Discharge: 2020-09-25 | Disposition: A | Discharge status: Home / Self Care | Payer: Self-pay | Attending: Emergency Medicine | Admitting: Emergency Medicine

## 2020-09-24 ENCOUNTER — Other Ambulatory Visit: Payer: Self-pay

## 2020-09-24 ENCOUNTER — Encounter (HOSPITAL_COMMUNITY): Payer: Self-pay | Admitting: Emergency Medicine

## 2020-09-24 DIAGNOSIS — J029 Acute pharyngitis, unspecified: Secondary | ICD-10-CM | POA: Insufficient documentation

## 2020-09-24 DIAGNOSIS — Z79899 Other long term (current) drug therapy: Secondary | ICD-10-CM | POA: Insufficient documentation

## 2020-09-24 DIAGNOSIS — I10 Essential (primary) hypertension: Secondary | ICD-10-CM | POA: Insufficient documentation

## 2020-09-24 DIAGNOSIS — Z87891 Personal history of nicotine dependence: Secondary | ICD-10-CM | POA: Insufficient documentation

## 2020-09-24 DIAGNOSIS — Z20822 Contact with and (suspected) exposure to covid-19: Secondary | ICD-10-CM | POA: Insufficient documentation

## 2020-09-24 NOTE — ED Triage Notes (Signed)
Pt states she was here last week and dx with sinus infection and given abx. Pt states she is no better and now having severe sore throat with white spots on her throat. Pt also c/o fevers off and on.

## 2020-09-25 ENCOUNTER — Emergency Department (HOSPITAL_COMMUNITY): Payer: Self-pay

## 2020-09-25 LAB — RESP PANEL BY RT-PCR (FLU A&B, COVID) ARPGX2
Influenza A by PCR: NEGATIVE
Influenza B by PCR: NEGATIVE
SARS Coronavirus 2 by RT PCR: NEGATIVE

## 2020-09-25 LAB — GROUP A STREP BY PCR: Group A Strep by PCR: NOT DETECTED

## 2020-09-25 MED ORDER — DOXYCYCLINE HYCLATE 100 MG PO CAPS: 100.0000 mg | ORAL_CAPSULE | Freq: Two times a day (BID) | ORAL | 0 refills | Status: DC

## 2020-09-25 MED ORDER — FLUCONAZOLE 150 MG PO TABS: 150.0000 mg | ORAL_TABLET | Freq: Every day | ORAL | 0 refills | Status: AC

## 2020-09-25 MED ORDER — ALUM & MAG HYDROXIDE-SIMETH 200-200-20 MG/5ML PO SUSP
30.0000 mL | Freq: Once | ORAL | Status: AC
  Administered 2020-09-25: 30 mL via ORAL
  Filled 2020-09-25: qty 30

## 2020-09-25 MED ORDER — LIDOCAINE VISCOUS HCL 2 % MT SOLN
15.0000 mL | Freq: Once | OROMUCOSAL | Status: AC
  Administered 2020-09-25: 15 mL via ORAL
  Filled 2020-09-25: qty 15

## 2020-09-25 MED ORDER — DOXYCYCLINE HYCLATE 100 MG PO TABS
100.0000 mg | ORAL_TABLET | Freq: Once | ORAL | Status: AC
  Administered 2020-09-25: 100 mg via ORAL
  Filled 2020-09-25: qty 1

## 2020-09-25 NOTE — ED Provider Notes (Signed)
[facility] EMERGENCY DEPARTMENT Provider Note   CSN: [id number] Arrival date & time: 09/24/20  2326     History Chief Complaint  Patient presents with   Sore Throat    Tempest Frankland is a 50 y.o. female.  The history is provided by the patient.  Sore Throat This is a new problem. The current episode started more than 2 days ago. The problem occurs daily. The problem has been gradually worsening. Associated symptoms include headaches. Pertinent negatives include no chest pain and no shortness of breath. The symptoms are aggravated by swallowing. The symptoms are relieved by rest.  Patient reports recent history of sinusitis.  She completed a course of Augmentin.  A few days later she began having sore throat.  She reports headache and congestion.  No significant cough.  No fevers today.  No vomiting. She is able to take p.o. fluids but it hurts    Past Medical History:  Diagnosis Date   Anemia    d/t gynecological issues   Anxiety    Arthritis    Back injury    Blood transfusion without reported diagnosis    Chronic back pain    Depression    Dysrhythmia    HX SVT   Fatty liver    GERD (gastroesophageal reflux disease)    Hypertension    Sleep apnea    SVT (supraventricular tachycardia) [facility])     Patient Active Problem List   Diagnosis Date Noted   SVT (supraventricular tachycardia) (HCC) 11/16/2018   S/P vaginal hysterectomy 10/17/2018   Chest pain 06/30/2018   Hyperglycemia 06/30/2018   Protrusion of lumbar intervertebral disc 05/04/2017   Anemia 11/04/2016   Anxiety 11/04/2016    Past Surgical History:  Procedure Laterality Date   DILITATION & CURRETTAGE/HYSTROSCOPY WITH NOVASURE ABLATION N/A 08/16/2017   Procedure: DILATATION & CURETTAGE/HYSTEROSCOPY WITH MINERVA  ENDOMETRIAL ABLATION;  Surgeon: Lazaro Arms, MD;  Location: [facility] ORS;  Service: Gynecology;  Laterality: N/A;   SVT ABLATION N/A 11/16/2018   Procedure: SVT ABLATION;  Surgeon: Marinus Maw, MD;   Location: [facility] INVASIVE CV LAB;  Service: Cardiovascular;  Laterality: N/A;   TUBAL LIGATION     VAGINAL HYSTERECTOMY N/A 10/17/2018   Procedure: HYSTERECTOMY VAGINAL;  Surgeon: Lazaro Arms, MD;  Location: [facility];  Service: Gynecology;  Laterality: N/A;   WRIST SURGERY Right      OB History     Gravida  4   Para  4   Term      Preterm      AB      Living  4      SAB      IAB      Ectopic      Multiple      Live Births              Family History  Problem Relation Age of Onset   Hypertension Mother    Heart disease Mother    COPD Father    Hypertension Maternal Aunt    Diabetes Maternal Grandmother    Kidney disease Sister    Colon cancer Neg Hx    Esophageal cancer Neg Hx    Rectal cancer Neg Hx    Stomach cancer Neg Hx     Social History   Tobacco Use   Smoking status: Former    Packs/day: 1.00    Years: 33.00    Pack years: 33.00    Types: Cigarettes    Quit  date: 04/02/2008    Years since quitting: 12.4   Smokeless tobacco: Never  Vaping Use   Vaping Use: Former  Substance Use Topics   Alcohol use: No   Drug use: No    Home Medications Prior to Admission medications   Medication Sig Start Date End Date Taking? Authorizing Provider  doxycycline (VIBRAMYCIN) 100 MG capsule Take 1 capsule (100 mg total) by mouth 2 (two) times daily. One po bid x 7 days 09/25/20  Yes Zadie Rhine, MD  fluconazole (DIFLUCAN) 150 MG tablet Take 1 tablet (150 mg total) by mouth daily for 1 day. 09/25/20 09/26/20 Yes Zadie Rhine, MD  esomeprazole (NEXIUM) 20 MG packet Take 20 mg by mouth daily before breakfast. OTC    [provider]  flecainide (TAMBOCOR) 50 MG tablet Take 1 tablet (50 mg total) by mouth 2 (two) times daily. 05/29/19   Marinus Maw, MD  metoprolol succinate (TOPROL-XL) 25 MG 24 hr tablet TAKE 1 TABLET (25 MG TOTAL) BY MOUTH AS NEEDED. 07/10/20   Marinus Maw, MD  Multiple Vitamins-Minerals (ALIVE WOMENS GUMMY PO) Take 2 each by  mouth daily. Chewables    [provider]    Allergies    Kenalog  [triamcinolone] and Clindamycin/lincomycin  Review of Systems   Review of Systems  HENT:  Positive for sore throat. Negative for drooling and trouble swallowing.   Respiratory:  Negative for shortness of breath.   Cardiovascular:  Negative for chest pain.  Neurological:  Positive for headaches.  All other systems reviewed and are negative.  Physical Exam Updated Vital Signs BP (!) 163/93 (BP Location: Right Wrist)   Pulse 85   Temp 97.7 F (36.5 C) (Oral)   Resp 19   Ht 1.676 m (5\' 6" )   Wt 125.6 kg   LMP 07/05/2017 (Approximate)   SpO2 100%   BMI 44.71 kg/m   Physical Exam CONSTITUTIONAL: Well developed/well nourished HEAD: Normocephalic/atraumatic EYES: EOMI/PERRL ENMT: Mucous membranes moist uvula midline with mild erythema and exudates.  No angioedema.  No drooling, no stridor, no dysphonia No signs of thrush NECK: supple no meningeal signs, cervical lymphadenopathy noted CV: S1/S2 noted, no murmurs/rubs/gallops noted LUNGS: Lungs are clear to auscultation bilaterally, no apparent distress ABDOMEN: soft NEURO: Pt is awake/alert/appropriate, moves all extremitiesx4.  No facial droop.   EXTREMITIES: full ROM SKIN: warm, color normal PSYCH: no abnormalities of mood noted, alert and oriented to situation  ED Results / Procedures / Treatments   Labs (all labs ordered are listed, but only abnormal results are displayed) Labs Reviewed  GROUP A STREP BY PCR  RESP PANEL BY RT-PCR (FLU A&B, COVID) [id number]    EKG None  Radiology DG Neck Soft Tissue  Result Date: 09/25/2020 CLINICAL DATA:  Sore throat EXAM: NECK SOFT TISSUES - 1+ VIEW COMPARISON:  None. FINDINGS: Epiglottis and aryepiglottic folds are within normal limits. No radiopaque foreign body is noted. Mild degenerative changes of the cervical spine are noted at C5-6. No soft tissue abnormality is noted. IMPRESSION: No acute abnormality  noted. Electronically Signed   By: 09/27/2020 M.D.   On: 09/25/2020 01:34    Procedures Procedures   Medications Ordered in ED Medications  doxycycline (VIBRA-TABS) tablet 100 mg (has no administration in time range)  alum & mag hydroxide-simeth (MAALOX/MYLANTA) 200-200-20 MG/5ML suspension 30 mL (30 mLs Oral Given 09/25/20 0108)    And  lidocaine (XYLOCAINE) 2 % viscous mouth solution 15 mL (15 mLs Oral Given 09/25/20 0108)  ED Course  I have reviewed the triage vital signs and the nursing notes.  Pertinent labs & imaging results that were available during my care of the patient were reviewed by me and considered in my medical decision making (see chart for details).    MDM Rules/Calculators/A&P                           Patient recently completed a course of Augmentin for sinusitis and has pharyngitis.  She reports that her symptoms actually feel worse after Augmentin, it caused a headache, and caused a yeast infection. Labs and imaging tonight are unremarkable.  Patient requests further antibiotics.  She is requesting doxycycline. Overall patient is in no distress.  Her neck is supple, she is handling her secretions, no dysphonia. Personally I viewed the x-ray and it is negative Final Clinical Impression(s) / ED Diagnoses Final diagnoses:  Pharyngitis, unspecified etiology    Rx / DC Orders ED Discharge Orders          Ordered    doxycycline (VIBRAMYCIN) 100 MG capsule  2 times daily        09/25/20 0151    fluconazole (DIFLUCAN) 150 MG tablet  Daily        09/25/20 0151             Zadie Rhine, MD 09/25/20 0153

## 2020-10-17 ENCOUNTER — Emergency Department (HOSPITAL_COMMUNITY): Payer: Self-pay

## 2020-10-17 ENCOUNTER — Emergency Department (HOSPITAL_COMMUNITY)
Admission: EM | Admit: 2020-10-17 | Discharge: 2020-10-17 | Disposition: A | Discharge status: Home / Self Care | Payer: Self-pay | Attending: Emergency Medicine | Admitting: Emergency Medicine

## 2020-10-17 ENCOUNTER — Encounter (HOSPITAL_COMMUNITY): Payer: Self-pay

## 2020-10-17 ENCOUNTER — Other Ambulatory Visit: Payer: Self-pay

## 2020-10-17 DIAGNOSIS — U071 COVID-19: Secondary | ICD-10-CM | POA: Insufficient documentation

## 2020-10-17 DIAGNOSIS — Z79899 Other long term (current) drug therapy: Secondary | ICD-10-CM | POA: Insufficient documentation

## 2020-10-17 DIAGNOSIS — Z87891 Personal history of nicotine dependence: Secondary | ICD-10-CM | POA: Insufficient documentation

## 2020-10-17 DIAGNOSIS — I10 Essential (primary) hypertension: Secondary | ICD-10-CM | POA: Insufficient documentation

## 2020-10-17 LAB — CBC WITH DIFFERENTIAL/PLATELET
Abs Immature Granulocytes: 0.02 10*3/uL (ref 0.00–0.07)
Basophils Absolute: 0.1 10*3/uL (ref 0.0–0.1)
Basophils Relative: 1 %
Eosinophils Absolute: 0.1 10*3/uL (ref 0.0–0.5)
Eosinophils Relative: 2 %
HCT: 43.7 % (ref 36.0–46.0)
Hemoglobin: 13.9 g/dL (ref 12.0–15.0)
Immature Granulocytes: 0 %
Lymphocytes Relative: 15 %
Lymphs Abs: 1.1 10*3/uL (ref 0.7–4.0)
MCH: 29 pg (ref 26.0–34.0)
MCHC: 31.8 g/dL (ref 30.0–36.0)
MCV: 91.2 fL (ref 80.0–100.0)
Monocytes Absolute: 0.7 10*3/uL (ref 0.1–1.0)
Monocytes Relative: 10 %
Neutro Abs: 5 10*3/uL (ref 1.7–7.7)
Neutrophils Relative %: 72 %
Platelets: 219 10*3/uL (ref 150–400)
RBC: 4.79 MIL/uL (ref 3.87–5.11)
RDW: 14.9 % (ref 11.5–15.5)
WBC: 7 10*3/uL (ref 4.0–10.5)
nRBC: 0 % (ref 0.0–0.2)

## 2020-10-17 LAB — BASIC METABOLIC PANEL
Anion gap: 4 — ABNORMAL LOW (ref 5–15)
BUN: 13 mg/dL (ref 6–20)
CO2: 25 mmol/L (ref 22–32)
Calcium: 8.4 mg/dL — ABNORMAL LOW (ref 8.9–10.3)
Chloride: 107 mmol/L (ref 98–111)
Creatinine, Ser: 0.68 mg/dL (ref 0.44–1.00)
GFR, Estimated: >60 mL/min (ref >60)
Glucose, Bld: 119 mg/dL — ABNORMAL HIGH (ref 70–99)
Potassium: 4.5 mmol/L (ref 3.5–5.1)
Sodium: 136 mmol/L (ref 135–145)

## 2020-10-17 LAB — RESP PANEL BY RT-PCR (FLU A&B, COVID) ARPGX2
Influenza A by PCR: NEGATIVE
Influenza B by PCR: NEGATIVE
SARS Coronavirus 2 by RT PCR: POSITIVE — AB

## 2020-10-17 MED ORDER — NIRMATRELVIR/RITONAVIR (PAXLOVID)TABLET: 3.0000 | ORAL_TABLET | Freq: Two times a day (BID) | ORAL | 0 refills | Status: AC

## 2020-10-17 NOTE — ED Triage Notes (Signed)
Pt c/o fever, chills, cough, x 1 week.  Reports has tested negative for covid twice this week.

## 2020-10-17 NOTE — ED Provider Notes (Signed)
[facility] EMERGENCY DEPARTMENT Provider Note   CSN: [id number] Arrival date & time: 10/17/20  1714     History Chief Complaint  Patient presents with   Fever    Donna Long is a 50 y.o. female.  HPI  Patient with no significant medical history presents  with chief complaint of URI-like symptoms.  Patient states symptoms started approximately 3 days ago, she endorses headaches, subjective fevers and chills, nasal congestion, sore throat, shortness of breath when she is coughing, a productive cough, and general body aches.  She does not endorse chest pain, states that she is tolerating p.o, denies nausea, vomiting, diarrhea, denies loss of taste or smell.  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  She is not immunocompromise.  States that she is taking over-the-counter medication without much relief.   Past Medical History:  Diagnosis Date   Anemia    d/t gynecological issues   Anxiety    Arthritis    Back injury    Blood transfusion without reported diagnosis    Chronic back pain    Depression    Dysrhythmia    HX SVT   Fatty liver    GERD (gastroesophageal reflux disease)    Hypertension    Sleep apnea    SVT (supraventricular tachycardia) [facility])     Patient Active Problem List   Diagnosis Date Noted   SVT (supraventricular tachycardia) (HCC) 11/16/2018   S/P vaginal hysterectomy 10/17/2018   Chest pain 06/30/2018   Hyperglycemia 06/30/2018   Protrusion of lumbar intervertebral disc 05/04/2017   Anemia 11/04/2016   Anxiety 11/04/2016    Past Surgical History:  Procedure Laterality Date   DILITATION & CURRETTAGE/HYSTROSCOPY WITH NOVASURE ABLATION N/A 08/16/2017   Procedure: DILATATION & CURETTAGE/HYSTEROSCOPY WITH MINERVA  ENDOMETRIAL ABLATION;  Surgeon: Lazaro Arms, MD;  Location: [facility];  Service: Gynecology;  Laterality: N/A;   SVT  ABLATION N/A 11/16/2018   Procedure: SVT ABLATION;  Surgeon: Marinus Maw, MD;  Location: [facility] INVASIVE CV LAB;  Service: Cardiovascular;  Laterality: N/A;   TUBAL LIGATION     VAGINAL HYSTERECTOMY N/A 10/17/2018   Procedure: HYSTERECTOMY VAGINAL;  Surgeon: Lazaro Arms, MD;  Location: [facility];  Service: Gynecology;  Laterality: N/A;   WRIST SURGERY Right      OB History     Gravida  4   Para  4   Term      Preterm      AB      Living  4      SAB      IAB      Ectopic      Multiple      Live Births              Family History  Problem Relation Age of Onset   Hypertension Mother    Heart disease Mother    COPD Father    Hypertension Maternal Aunt    Diabetes Maternal Grandmother    Kidney disease Sister    Colon cancer Neg Hx    Esophageal cancer Neg Hx    Rectal cancer Neg Hx    Stomach cancer Neg Hx     Social History   Tobacco Use   Smoking status: Former    Packs/day: 1.00    Years: 33.00    Pack years: 33.00    Types:  Cigarettes    Quit date: 04/02/2008    Years since quitting: 12.5   Smokeless tobacco: Never  Vaping Use   Vaping Use: Former  Substance Use Topics   Alcohol use: No   Drug use: No    Home Medications Prior to Admission medications   Medication Sig Start Date End Date Taking? Authorizing Provider  nirmatrelvir/ritonavir EUA (PAXLOVID) 20 x 150 MG & 10 x 100MG  TABS Take 3 tablets by mouth 2 (two) times daily for 5 days. Take nirmatrelvir (150 mg) two tablets twice daily for 5 days and ritonavir (100 mg) one tablet twice daily for 5 days. 10/17/20 10/22/20 Yes Carroll SageFaulkner, Kylar Leonhardt J, PA-C  doxycycline (VIBRAMYCIN) 100 MG capsule Take 1 capsule (100 mg total) by mouth 2 (two) times daily. One po bid x 7 days 09/25/20   Zadie RhineWickline, Donald, MD  esomeprazole (NEXIUM) 20 MG packet Take 20 mg by mouth daily before breakfast. OTC    [provider]  flecainide (TAMBOCOR) 50 MG tablet Take 1 tablet (50 mg total) by mouth 2 (two)  times daily. 05/29/19   Marinus Mawaylor, Gregg W, MD  metoprolol succinate (TOPROL-XL) 25 MG 24 hr tablet TAKE 1 TABLET (25 MG TOTAL) BY MOUTH AS NEEDED. 07/10/20   Marinus Mawaylor, Gregg W, MD  Multiple Vitamins-Minerals (ALIVE WOMENS GUMMY PO) Take 2 each by mouth daily. Chewables    [provider]    Allergies    Kenalog  [triamcinolone] and Clindamycin/lincomycin  Review of Systems   Review of Systems  Constitutional:  Positive for chills and fever.  HENT:  Positive for congestion and sore throat.   Respiratory:  Positive for cough. Negative for shortness of breath.   Cardiovascular:  Negative for chest pain.  Gastrointestinal:  Negative for abdominal pain, diarrhea, nausea and vomiting.  Genitourinary:  Negative for enuresis.  Musculoskeletal:  Positive for myalgias. Negative for back pain.  Skin:  Negative for rash.  Neurological:  Positive for headaches. Negative for dizziness and facial asymmetry.  Hematological:  Does not bruise/bleed easily.   Physical Exam Updated Vital Signs BP 140/82 (BP Location: Right Arm)   Pulse 84   Temp 99.6 F (37.6 C)   Resp 18   Ht 5\' 6"  (1.676 m)   Wt 135.8 kg   LMP 07/05/2017 (Approximate)   SpO2 100%   BMI 48.33 kg/m   Physical Exam Vitals and nursing note reviewed.  Constitutional:      General: She is not in acute distress.    Appearance: She is not ill-appearing.  HENT:     Head: Normocephalic and atraumatic.     Right Ear: Tympanic membrane, ear canal and external ear normal.     Left Ear: Tympanic membrane, ear canal and external ear normal.     Nose: No congestion.  Eyes:     Conjunctiva/sclera: Conjunctivae normal.  Cardiovascular:     Rate and Rhythm: Normal rate and regular rhythm.     Pulses: Normal pulses.     Heart sounds: No murmur heard.   No friction rub. No gallop.  Pulmonary:     Effort: No respiratory distress.     Breath sounds: No wheezing, rhonchi or rales.  Skin:    General: Skin is warm and dry.   Neurological:     Mental Status: She is alert.  Psychiatric:        Mood and Affect: Mood normal.    ED Results / Procedures / Treatments   Labs (all labs ordered are listed, but  only abnormal results are displayed) Labs Reviewed  RESP PANEL BY RT-PCR (FLU A&B, COVID) ARPGX2 - Abnormal; Notable for the following components:      Result Value   SARS Coronavirus 2 by RT PCR POSITIVE (*)    All other components within normal limits  BASIC METABOLIC PANEL - Abnormal; Notable for the following components:   Glucose, Bld 119 (*)    Calcium 8.4 (*)    Anion gap 4 (*)    All other components within normal limits  CBC WITH DIFFERENTIAL/PLATELET    EKG None  Radiology DG Chest Port 1 View  Result Date: 10/17/2020 CLINICAL DATA:  Fever, cough, chills for 1 week EXAM: PORTABLE CHEST 1 VIEW COMPARISON:  06/17/2020 FINDINGS: The heart size and mediastinal contours are within normal limits. Both lungs are clear. The visualized skeletal structures are unremarkable. IMPRESSION: No active disease. Electronically Signed   By: Sharlet Salina M.D.   On: 10/17/2020 18:40    Procedures Procedures   Medications Ordered in ED Medications - No data to display  ED Course  I have reviewed the triage vital signs and the nursing notes.  Pertinent labs & imaging results that were available during my care of the patient were reviewed by me and considered in my medical decision making (see chart for details).    MDM Rules/Calculators/A&P                          Initial impression-patient presents with URI-like symptoms.  She is alert, does not appear acute stress, vital signs reassuring.  Work-up-CBC is unremarkable, BMP shows slight hyperglycemia of 119, calcium 8.4, anion gap 4.  Chest x-ray is unremarkable.  Reassessment-nursing staff notified me that patient would like lab work, reassess the patient, she is concerned because  she has been sick a couple times and wanted to be sure that there is  nothing wrong with her.  will add on basic lab work-up chest x-ray  Rule out- Low suspicion for systemic infection as patient is nontoxic-appearing, vital signs reassuring, no obvious source infection noted on exam.  Low suspicion for pneumonia as lung sounds are clear bilaterally, x-ray did not reveal any acute findings.  I have low suspicion for PE as patient denies pleuritic chest pain, shortness of breath, patient is PERC. low suspicion for strep throat as oropharynx was visualized, no erythema or exudates noted.  Low suspicion patient would need  hospitalized due to viral infection or Covid as vital signs reassuring, patient is not in respiratory distress.    Plan-  URI-like symptoms-likely this is secondary due to her COVID infection, patient is within the treatment window,  has a BMI of 48 making her at increased risk for adverse outcome.  will start her on Paxlovid , recommend over-the-counter pain medications, follow-up with post-COVID care for further evaluation.  Vital signs have remained stable, no indication for hospital admission.    Patient given at home care as well strict return precautions.  Patient verbalized that they understood agreed to said plan.  Final Clinical Impression(s) / ED Diagnoses Final diagnoses:  COVID    Rx / DC Orders ED Discharge Orders          Ordered    nirmatrelvir/ritonavir EUA (PAXLOVID) 20 x 150 MG & 10 x 100MG  TABS  2 times daily        10/17/20 1858             12/17/20,  PA-C 10/17/20 1900    Jacalyn Lefevre, MD 10/17/20 1931

## 2020-10-17 NOTE — Discharge Instructions (Signed)
You have COVID, of starting on the antiviral treatment please take as prescribed.  I recommend taking Tylenol for fever control and ibuprofen for pain control please follow dosing on the back of bottle.  I recommend staying hydrated and if you do not an appetite, I recommend soups as this will provide you with fluids and calories.    you are Covid positive you must self quarantine for 5 days starting on symptom onset, if at the end of those 5 days you are feeling better you may return back to school/work, if you continue to have symptoms you must self quarantine for additional 5 days.  I would like you to contact "post Covid care" as they will provide you with information how to manage your Covid symptoms  Come back to the emergency department if you develop chest pain, shortness of breath, severe abdominal pain, uncontrolled nausea, vomiting, diarrhea.

## 2020-10-17 NOTE — ED Notes (Signed)
Pt provided discharge instructions and prescription information. Pt was given the opportunity to ask questions and questions were answered. Discharge signature not obtained in the setting of the COVID-19 pandemic in order to reduce high touch surfaces.  ° °

## 2020-11-25 ENCOUNTER — Telehealth: Payer: Self-pay | Admitting: Internal Medicine

## 2020-11-25 MED ORDER — METOPROLOL SUCCINATE ER 25 MG PO TB24
25.0000 mg | ORAL_TABLET | ORAL | 0 refills | Status: DC | PRN
Start: 2020-11-25 — End: 2021-04-06

## 2020-11-25 NOTE — Telephone Encounter (Signed)
Pt is needing a refill on her metoprolol succinate (TOPROL-XL) 25 MG 24 hr tablet [[id number]] sent to [facility], 90 day  She's been out for 1 week and her BP is around 174/114   She currently does not have ins and has applied for assistance through the [facility], she's just waiting to hear back and it takes a few weeks. She's currently unable to pay out of pocket for an office visit.

## 2020-11-25 NOTE — Telephone Encounter (Signed)
30 Day supply sent to [facility]

## 2021-04-06 ENCOUNTER — Telehealth: Payer: Self-pay | Admitting: Internal Medicine

## 2021-04-06 ENCOUNTER — Ambulatory Visit: Payer: Self-pay | Admitting: Internal Medicine

## 2021-04-06 MED ORDER — METOPROLOL SUCCINATE ER 25 MG PO TB24: 25.0000 mg | ORAL_TABLET | ORAL | 1 refills | Status: DC | PRN

## 2021-04-06 NOTE — Telephone Encounter (Signed)
Pt has appt on 04/27/2021 with Dr. Ladona Ridgel in the [place] office.

## 2021-04-06 NOTE — Telephone Encounter (Signed)
°*  STAT* If patient is at the pharmacy, call can be transferred to refill team.   1. Which medications need to be refilled? (please list name of each medication and dose if known) metoprolol succinate (TOPROL-XL) 25 MG 24 hr tablet  2. Which pharmacy/location (including street and city if local pharmacy) is medication to be sent to?[id number] - [place], [place] - [address] AT [place] [address] P: [phone] F: [phone]   3. Do they need a 30 day or 90 day supply? 30 ds

## 2021-04-27 ENCOUNTER — Ambulatory Visit: Payer: Self-pay | Admitting: Internal Medicine

## 2021-05-25 ENCOUNTER — Other Ambulatory Visit: Payer: Self-pay | Admitting: Internal Medicine

## 2021-09-08 ENCOUNTER — Emergency Department (HOSPITAL_COMMUNITY)
Admission: EM | Admit: 2021-09-08 | Discharge: 2021-09-08 | Disposition: A | Discharge status: Home / Self Care | Payer: Self-pay | Attending: Emergency Medicine | Admitting: Emergency Medicine

## 2021-09-08 ENCOUNTER — Encounter (HOSPITAL_COMMUNITY): Payer: Self-pay | Admitting: *Deleted

## 2021-09-08 ENCOUNTER — Other Ambulatory Visit: Payer: Self-pay

## 2021-09-08 DIAGNOSIS — L0291 Cutaneous abscess, unspecified: Secondary | ICD-10-CM

## 2021-09-08 DIAGNOSIS — L02811 Cutaneous abscess of head [any part, except face]: Secondary | ICD-10-CM | POA: Insufficient documentation

## 2021-09-08 DIAGNOSIS — R0981 Nasal congestion: Secondary | ICD-10-CM | POA: Insufficient documentation

## 2021-09-08 DIAGNOSIS — M542 Cervicalgia: Secondary | ICD-10-CM | POA: Insufficient documentation

## 2021-09-08 MED ORDER — FLUCONAZOLE 200 MG PO TABS: 200.0000 mg | ORAL_TABLET | Freq: Once | ORAL | 0 refills | Status: AC

## 2021-09-08 MED ORDER — DOXYCYCLINE HYCLATE 100 MG PO TABS
100.0000 mg | ORAL_TABLET | Freq: Once | ORAL | Status: AC
  Administered 2021-09-08: 100 mg via ORAL
  Filled 2021-09-08: qty 1

## 2021-09-08 MED ORDER — IBUPROFEN 800 MG PO TABS: 800.0000 mg | ORAL_TABLET | Freq: Three times a day (TID) | ORAL | 0 refills | Status: DC

## 2021-09-08 MED ORDER — DOXYCYCLINE HYCLATE 100 MG PO CAPS: 100.0000 mg | ORAL_CAPSULE | Freq: Two times a day (BID) | ORAL | 0 refills | Status: DC

## 2021-09-08 NOTE — ED Triage Notes (Signed)
Pt c/o abscess to inside of her nose and states she is now having neck, head and face pain; pt states she had chills last night

## 2021-09-08 NOTE — Discharge Instructions (Signed)
Take the antibiotic as directed until its finished.  Follow-up with your primary care provider for recheck or return emergency department for any new or worsening symptoms.

## 2021-09-08 NOTE — ED Provider Notes (Signed)
[facility] EMERGENCY DEPARTMENT Provider Note   CSN: [id number] Arrival date & time: 09/08/21  1656     History  Chief Complaint  Patient presents with   Abscess    Donna Long is a 51 y.o. female.   Abscess Associated symptoms: headaches   Associated symptoms: no fever, no nausea and no vomiting        Donna Long is a 51 y.o. female who presents to the Emergency Department complaining of pain and swelling of her nose.  She states that she had a "bump" inside her left nostril for several days.  The bump popped last night and drained pus and blood.  Symptoms began after trimming the hair in her nose.  For the last several days, she is having pain to her her face, left neck and left-sided headache.  She feels as though she is congested.  She denies any fever but reports having chills yesterday.  Taken over-the-counter medications without relief.  She denies visual changes, neck stiffness, dizziness, nausea, vomiting, cough, shortness of breath or chest pain.  No reported swelling to her face other than her nose.  Home Medications Prior to Admission medications   Medication Sig Start Date End Date Taking? Authorizing Provider  doxycycline (VIBRAMYCIN) 100 MG capsule Take 1 capsule (100 mg total) by mouth 2 (two) times daily. One po bid x 7 days 09/25/20   Zadie Rhine, MD  esomeprazole (NEXIUM) 20 MG packet Take 20 mg by mouth daily before breakfast. OTC    [provider]  flecainide (TAMBOCOR) 50 MG tablet Take 1 tablet (50 mg total) by mouth 2 (two) times daily. 05/29/19   Marinus Maw, MD  metoprolol succinate (TOPROL-XL) 25 MG 24 hr tablet TAKE 1 TABLET BY MOUTH DAILY AS NEEDED 05/26/21   Marinus Maw, MD  Multiple Vitamins-Minerals (ALIVE WOMENS GUMMY PO) Take 2 each by mouth daily. Chewables    [provider]      Allergies    Kenalog  [triamcinolone] and Clindamycin/lincomycin    Review of Systems   Review of Systems  Constitutional:  Positive  for chills. Negative for appetite change and fever.  HENT:  Positive for congestion, facial swelling (Swelling to her nose) and sinus pressure. Negative for dental problem, ear pain, sore throat, trouble swallowing and voice change.   Eyes:  Negative for pain and visual disturbance.  Respiratory:  Negative for chest tightness and shortness of breath.   Cardiovascular:  Negative for chest pain.  Gastrointestinal:  Negative for abdominal pain, nausea and vomiting.  Musculoskeletal:  Positive for neck pain. Negative for myalgias and neck stiffness.  Neurological:  Positive for headaches. Negative for dizziness, syncope, weakness and numbness.    Physical Exam Updated Vital Signs BP (!) 141/92 (BP Location: Right Arm)   Pulse 68   Temp 98 F (36.7 C) (Oral)   Resp 17   Ht 5' 6.5" (1.689 m)   Wt 130.6 kg   LMP 07/05/2017 (Approximate)   SpO2 99%   BMI 45.79 kg/m  Physical Exam Vitals and nursing note reviewed.  Constitutional:      General: She is not in acute distress.    Appearance: Normal appearance. She is not ill-appearing or toxic-appearing.  HENT:     Right Ear: Tympanic membrane and ear canal normal.     Left Ear: Tympanic membrane and ear canal normal.     Nose: No rhinorrhea.     Comments: Small pustule inside the left nare, there is  some mild surrounding erythema.  No active drainage or bleeding.  Focal tenderness to the distal nose without erythema or obvious edema    Mouth/Throat:     Mouth: Mucous membranes are moist.     Pharynx: Oropharynx is clear. No oropharyngeal exudate or posterior oropharyngeal erythema.  Eyes:     Extraocular Movements: Extraocular movements intact.     Conjunctiva/sclera: Conjunctivae normal.     Pupils: Pupils are equal, round, and reactive to light.  Neck:     Trachea: Phonation normal.     Meningeal: Kernig's sign absent.  Cardiovascular:     Rate and Rhythm: Normal rate and regular rhythm.     Pulses: Normal pulses.  Pulmonary:      Effort: Pulmonary effort is normal. No respiratory distress.     Breath sounds: Normal breath sounds. No wheezing.  Musculoskeletal:        General: Normal range of motion.     Cervical back: Normal range of motion. Tenderness present. No edema or rigidity. Muscular tenderness present. No spinous process tenderness.  Lymphadenopathy:     Cervical: Cervical adenopathy present.  Skin:    General: Skin is warm.     Capillary Refill: Capillary refill takes less than 2 seconds.  Neurological:     Mental Status: She is alert.     ED Results / Procedures / Treatments   Labs (all labs ordered are listed, but only abnormal results are displayed) Labs Reviewed - No data to display  EKG None  Radiology No results found.  Procedures Procedures    Medications Ordered in ED Medications  doxycycline (VIBRA-TABS) tablet 100 mg (has no administration in time range)    ED Course/ Medical Decision Making/ A&P                           Medical Decision Making Patient here for evaluation of facial pain pressure congestion and headache.  Headache is left-sided and gradual in onset. symptoms began after having a pustule to the left nare that developed after trimming the hair in her nose.  Pustule began draining last evening.  Patient noted some purulent drainage and bleeding from the site.  No epistaxis.  On exam, patient is nontoxic-appearing, I cannot appreciate any significant edema to the distal nose.  There is a pustule noted with some surrounding erythema inside the left nare.  Likely represents abscess likely secondary to folliculitis.  Area is draining purulent material, no indication for I&D at this time.  Amount and/or Complexity of Data Reviewed Discussion of management or test interpretation with external provider(s): Patient with facial pain left-sided headache, no focal neurodeficits, no meningeal signs on exam.  Vital signs are reassuring.  Mildly hypertensive.  No concerning  symptoms for emergent process.  No appreciable facial edema.   She is agreeable to treatment plan with doxycycline.  She will continue Tylenol or ibuprofen if needed for pain.  She appears appropriate for discharge home, all questions were answered.  Risk Prescription drug management.           Final Clinical Impression(s) / ED Diagnoses Final diagnoses:  Abscess    Rx / DC Orders ED Discharge Orders     None         Pauline Aus, PA-C 09/08/21 1841    Mancel Bale, MD 09/09/21 1119

## 2021-10-07 ENCOUNTER — Emergency Department (HOSPITAL_COMMUNITY)
Admission: EM | Admit: 2021-10-07 | Discharge: 2021-10-08 | Discharge status: Against Medical Advice | Payer: Medicaid Other

## 2022-01-06 ENCOUNTER — Telehealth: Payer: Self-pay

## 2022-01-06 NOTE — Telephone Encounter (Signed)
Lvm for pt to call office to schedule appt for clearance.

## 2022-01-06 NOTE — Telephone Encounter (Signed)
..     Pre-operative Risk Assessment    Patient Name: Donna Long  DOB: 1970/03/29 MRN: [id number]      Request for Surgical Clearance    Procedure:   RIGHT SHOULDER SURGERY  Date of Surgery:  Clearance TBD                                 Surgeon:  DR Nils Flack Surgeon's Group or Practice Name:  [facility] Phone number:  [phone] Fax number:  [phone]   Type of Clearance Requested:   - Medical    Type of Anesthesia:  Not Indicated   Additional requests/questions:    Jola Babinski   01/06/2022, 1:27 PM

## 2022-01-06 NOTE — Telephone Encounter (Signed)
   Name: Donna Long  DOB: [birth year]  MRN: [id number]  Primary Cardiologist: Lewayne Bunting, MD  Chart reviewed as part of pre-operative protocol coverage. Because of Donna Long past medical history and time since last visit, she will require a follow-up in-office visit in order to better assess preoperative cardiovascular risk.  Pre-op covering staff: - Please schedule appointment and call patient to inform them. If patient already had an upcoming appointment within acceptable timeframe, please add "pre-op clearance" to the appointment notes so provider is aware. - Please contact requesting surgeon's office via preferred method (i.e, phone, fax) to inform them of need for appointment prior to surgery.   Carlos Levering, NP  01/06/2022, 2:08 PM

## 2022-01-09 NOTE — Progress Notes (Signed)
Office Visit    Patient Name: Donna Long Date of Encounter: 01/10/2022  PCP:  Patient, No Pcp Per   [facility]  Cardiologist:  Lewayne Bunting, MD  Advanced Practice Provider:  No care team member to display Electrophysiologist:  None    HPI    Donna Long is a 51 y.o. female with past medical history significant for anxiety, anemia, chronic back pain, depression, SV status post catheter ablation 10/20, hypertension, PVCs, PACs, OSA presents today for preop evaluation.  She was last seen by Dr. Ladona Long 05/2019 and at that time she was still having palpitations which worsened when she was under stress.  Due to this, flecainide 50 mg twice daily was initiated.  He was unable to titrate her beta-blocker due to her blood pressure.  Today, she presents for preop cardiac evaluation.  Today, she is being evaluated for her right shoulder surgery.  She was seen back in 2021 and at that time she was on flecainide.  Since then, she is off flecainide.  She is having many PVCs and PACs.  She is no longer having episodes of SVT.  We discussed a few different options.  I think that we should sort out her arrhythmia prior to surgery.  We have ordered a 7-day ZIO monitor.  Reports no shortness of breath nor dyspnea on exertion. Reports no chest pain, pressure, or tightness. No edema, orthopnea, PND.    Past Medical History    Past Medical History:  Diagnosis Date   Anemia    d/t gynecological issues   Anxiety    Arthritis    Back injury    Blood transfusion without reported diagnosis    Chronic back pain    Depression    Dysrhythmia    HX SVT   Fatty liver    GERD (gastroesophageal reflux disease)    Hypertension    Sleep apnea    SVT (supraventricular tachycardia)    Past Surgical History:  Procedure Laterality Date   DILITATION & CURRETTAGE/HYSTROSCOPY WITH NOVASURE ABLATION N/A 08/16/2017   Procedure: DILATATION & CURETTAGE/HYSTEROSCOPY WITH MINERVA   ENDOMETRIAL ABLATION;  Surgeon: Lazaro Arms, MD;  Location: [facility];  Service: Gynecology;  Laterality: N/A;   SVT ABLATION N/A 11/16/2018   Procedure: SVT ABLATION;  Surgeon: Marinus Maw, MD;  Location: [facility] INVASIVE CV LAB;  Service: Cardiovascular;  Laterality: N/A;   TUBAL LIGATION     VAGINAL HYSTERECTOMY N/A 10/17/2018   Procedure: HYSTERECTOMY VAGINAL;  Surgeon: Lazaro Arms, MD;  Location: [facility];  Service: Gynecology;  Laterality: N/A;   WRIST SURGERY Right     Allergies  Allergies  Allergen Reactions   Kenalog  [Triamcinolone] Itching   Clindamycin/Lincomycin Rash   EKGs/Labs/Other Studies Reviewed:   The following studies were reviewed today:  Cardiac monitor 05/15/2019 1. NSR with sinus brady and sinus tachycardia 2. Frequent PVC's 3. NSVT, bidirectional 4. No atrial fib 5. No prolonged pauses 6. NS atrial tachycardia   Donna Long,M.D.  EKG:  EKG is  ordered today.  The ekg ordered today demonstrates NSR, rate 80 bpm  Recent Labs: No results found for requested labs within last 365 days.  Recent Lipid Panel    Component Value Date/Time   CHOL 146 06/30/2018 0547   TRIG 181 (H) 06/30/2018 0547   HDL 38 (L) 06/30/2018 0547   CHOLHDL 3.8 06/30/2018 0547   VLDL 36 06/30/2018 0547   LDLCALC 72 06/30/2018 0547     Home  Medications   Current Meds  Medication Sig   acetaminophen (TYLENOL) 500 MG tablet Take 500 mg by mouth every 6 (six) hours as needed.   flecainide (TAMBOCOR) 50 MG tablet Take 1 tablet (50 mg total) by mouth 2 (two) times daily.   ibuprofen (ADVIL) 800 MG tablet Take 1 tablet (800 mg total) by mouth 3 (three) times daily. Take with food   metoprolol succinate (TOPROL-XL) 50 MG 24 hr tablet Take 1 tablet (50 mg total) by mouth daily. Take with or immediately following a meal.   [DISCONTINUED] metoprolol succinate (TOPROL-XL) 25 MG 24 hr tablet TAKE 1 TABLET BY MOUTH DAILY AS NEEDED     Review of Systems      All other systems reviewed  and are otherwise negative except as noted above.  Physical Exam    VS:  BP 122/74   Pulse 80   Ht 5' 6.5" (1.689 m)   Wt (!) 301 lb 6.4 oz (136.7 kg)   LMP 07/05/2017 (Approximate)   SpO2 97%   BMI 47.92 kg/m  , BMI Body mass index is 47.92 kg/m.  Wt Readings from Last 3 Encounters:  01/10/22 (!) 301 lb 6.4 oz (136.7 kg)  09/08/21 288 lb (130.6 kg)  10/17/20 299 lb 7 oz (135.8 kg)     GEN: Well nourished, well developed, in no acute distress. HEENT: normal. Neck: Supple, no JVD, carotid bruits, or masses. Cardiac: RRR, no murmurs, rubs, or gallops. No clubbing, cyanosis, edema.  Radials/PT 2+ and equal bilaterally.  Respiratory:  Respirations regular and unlabored, clear to auscultation bilaterally. GI: Soft, nontender, nondistended. MS: No deformity or atrophy. Skin: Warm and dry, no rash. Neuro:  Strength and sensation are intact. Psych: Normal affect.  Assessment & Plan    Preop Evaluation  [name]'s perioperative risk of a major cardiac event is 0.4% according to the Revised Cardiac Risk Index (RCRI).  Therefore, she is at low risk for perioperative complications.   Her functional capacity is good at 6.05 METs according to the Duke Activity Status Index (DASI). Recommendations: I would like to order a 7 day zio monitor and try her on a higher dose of metoprolol prior to clearance  Palpitations/PACs/PVCs/NS SVT -increase metoprolol -7 day zio for PAC/PVC burden   Hypertension -BP well controlled today in the clinic -continue to keep track at home -continue metoprolol          Disposition: Follow up 2-3 weeks with Lewayne Bunting, MD or APP.  Signed, Sharlene Dory, PA-C 01/10/2022, 5:08 PM [facility]

## 2022-01-10 ENCOUNTER — Ambulatory Visit: Payer: Medicaid Other | Attending: Physician Assistant | Admitting: Physician Assistant

## 2022-01-10 ENCOUNTER — Ambulatory Visit: Payer: MEDICAID | Attending: Physician Assistant

## 2022-01-10 ENCOUNTER — Encounter: Payer: Self-pay | Admitting: Physician Assistant

## 2022-01-10 VITALS — BP 122/74 | HR 80 | Ht 66.5 in | Wt 301.4 lb

## 2022-01-10 DIAGNOSIS — I491 Atrial premature depolarization: Secondary | ICD-10-CM | POA: Diagnosis not present

## 2022-01-10 DIAGNOSIS — R002 Palpitations: Secondary | ICD-10-CM

## 2022-01-10 DIAGNOSIS — I10 Essential (primary) hypertension: Secondary | ICD-10-CM

## 2022-01-10 DIAGNOSIS — I493 Ventricular premature depolarization: Secondary | ICD-10-CM | POA: Diagnosis not present

## 2022-01-10 DIAGNOSIS — Z01810 Encounter for preprocedural cardiovascular examination: Secondary | ICD-10-CM

## 2022-01-10 MED ORDER — METOPROLOL SUCCINATE ER 50 MG PO TB24: 50.0000 mg | ORAL_TABLET | Freq: Every day | ORAL | 3 refills | Status: DC

## 2022-01-10 NOTE — Patient Instructions (Signed)
Medication Instructions:  1.Increase metoprolol succinate to 50 mg daily *If you need a refill on your cardiac medications before your next appointment, please call your pharmacy*   Lab Work: None If you have labs (blood work) drawn today and your tests are completely normal, you will receive your results only by: MyChart Message (if you have MyChart) OR A paper copy in the mail If you have any lab test that is abnormal or we need to change your treatment, we will call you to review the results.   Testing/Procedures: Donna Long- Long Term Monitor Instructions  Your physician has requested you wear a ZIO patch monitor for 7 days.  This is a single patch monitor. [employer] supplies one patch monitor per enrollment. Additional stickers are not available. Please do not apply patch if you will be having a Nuclear Stress Test,  Echocardiogram, Cardiac CT, MRI, or Chest Xray during the period you would be wearing the  monitor. The patch cannot be worn during these tests. You cannot remove and re-apply the  ZIO XT patch monitor.  Your ZIO patch monitor will be mailed 3 day USPS to your address on file. It may take 3-5 days  to receive your monitor after you have been enrolled.  Once you have received your monitor, please review the enclosed instructions. Your monitor  has already been registered assigning a specific monitor serial # to you.  Billing and Patient Assistance Program Information  We have supplied [employer] with any of your insurance information on file for billing purposes. [employer] offers a sliding scale Patient Assistance Program for patients that do not have  insurance, or whose insurance does not completely cover the cost of the ZIO monitor.  You must apply for the Patient Assistance Program to qualify for this discounted rate.  To apply, please call [employer] at [phone], select option 4, select option 2, ask to apply for  Patient Assistance Program. [name] will ask your  household income, and how many people  are in your household. They will quote your out-of-pocket cost based on that information.  [employer] will also be able to set up a 75-month, interest-free payment plan if needed.  Applying the monitor   Shave hair from upper left chest.  Hold abrader disc by orange tab. Rub abrader in 40 strokes over the upper left chest as  indicated in your monitor instructions.  Clean area with 4 enclosed alcohol pads. Let dry.  Apply patch as indicated in monitor instructions. Patch will be placed under collarbone on left  side of chest with arrow pointing upward.  Rub patch adhesive wings for 2 minutes. Remove white label marked "1". Remove the white  label marked "2". Rub patch adhesive wings for 2 additional minutes.  While looking in a mirror, press and release button in center of patch. A small green light will  flash 3-4 times. This will be your only indicator that the monitor has been turned on.  Do not shower for the first 24 hours. You may shower after the first 24 hours.  Press the button if you feel a symptom. You will hear a small click. Record Date, Time and  Symptom in the Patient Logbook.  When you are ready to remove the patch, follow instructions on the last 2 pages of Patient  Logbook. Stick patch monitor onto the last page of Patient Logbook.  Place Patient Logbook in the blue and white box. Use locking tab on box and tape box closed  securely. The  blue and white box has prepaid postage on it. Please place it in the mailbox as  soon as possible. Your physician should have your test results approximately 7 days after the  monitor has been mailed back to [facility].  Call [facility] Customer Care at [phone] if you have questions regarding  your ZIO XT patch monitor. Call them immediately if you see an orange light blinking on your  monitor.  If your monitor falls off in less than 4 days, contact our Monitor department at [phone].   If your monitor becomes loose or falls off after 4 days call [employer] at [phone] for  suggestions on securing your monitor    Follow-Up: At [facility], you and your health needs are our priority.  As part of our continuing mission to provide you with exceptional heart care, we have created designated Provider Care Teams.  These Care Teams include your primary Cardiologist (physician) and Advanced Practice Providers (APPs -  Physician Assistants and Nurse Practitioners) who all work together to provide you with the care you need, when you need it.  Your next appointment:   2-3 week(s)  The format for your next appointment:   In Person  Provider:   Lewayne Bunting, MD  or Jari Favre, PA-C       Important Information About Sugar

## 2022-01-10 NOTE — Progress Notes (Unsigned)
[id number] ZIO XT from office inventory applied to patient. Dr. Ladona Ridgel to read.

## 2022-01-10 NOTE — Telephone Encounter (Signed)
Pt has appt today with Dr. Shari Prows 01/10/22. I will fax notes to MD for appt today. Once the pt has been cleared MD will have her nurse fax the clearance notes and medication recommendations to the requesting office.

## 2022-01-24 NOTE — Progress Notes (Unsigned)
Office Visit    Patient Name: Donna Long Date of Encounter: 01/25/2022  PCP:  Patient, No Pcp Per   [facility]  Cardiologist:  Donna Bunting, MD  Advanced Practice Provider:  No care team member to display Electrophysiologist:  None    HPI    Donna Long is a 51 y.o. female with past medical history significant for anxiety, anemia, chronic back pain, depression, SV status post catheter ablation 10/20, hypertension, PVCs, PACs, OSA presents today for preop evaluation.  She was last seen by Dr. Ladona Long 05/2019 and at that time she was still having palpitations which worsened when she was under stress.  Due to this, flecainide 50 mg twice daily was initiated.  He was unable to titrate her beta-blocker due to her blood pressure.  She was seen 01/10/2022 and she presented for preop cardiac evaluation.  She was being evaluated for her right shoulder surgery.  She was seen back in 2021 and at that time she was on flecainide.  Since then, she is off flecainide.  She is having many PVCs and PACs.  She is no longer having episodes of SVT.  We discussed a few different options.  I think that we should sort out her arrhythmia prior to surgery.  We have ordered a 7-day ZIO monitor.  Today, she is doing well. She states she feels much better since we increased her metoprolol at her last appointment. We reviewed her monitor today. She is not having any other heart related symptoms and feels well today.   Reports no shortness of breath nor dyspnea on exertion. Reports no chest pain, pressure, or tightness. No edema, orthopnea, PND. Reports no palpitations.   Past Medical History    Past Medical History:  Diagnosis Date   Anemia    d/t gynecological issues   Anxiety    Arthritis    Back injury    Blood transfusion without reported diagnosis    Chronic back pain    Depression    Dysrhythmia    HX SVT   Fatty liver    GERD (gastroesophageal reflux disease)     Hypertension    Sleep apnea    SVT (supraventricular tachycardia)    Past Surgical History:  Procedure Laterality Date   DILITATION & CURRETTAGE/HYSTROSCOPY WITH NOVASURE ABLATION N/A 08/16/2017   Procedure: DILATATION & CURETTAGE/HYSTEROSCOPY WITH MINERVA  ENDOMETRIAL ABLATION;  Surgeon: Lazaro Arms, MD;  Location: [facility];  Service: Gynecology;  Laterality: N/A;   SVT ABLATION N/A 11/16/2018   Procedure: SVT ABLATION;  Surgeon: Marinus Maw, MD;  Location: [facility] INVASIVE CV LAB;  Service: Cardiovascular;  Laterality: N/A;   TUBAL LIGATION     VAGINAL HYSTERECTOMY N/A 10/17/2018   Procedure: HYSTERECTOMY VAGINAL;  Surgeon: Lazaro Arms, MD;  Location: [facility];  Service: Gynecology;  Laterality: N/A;   WRIST SURGERY Right     Allergies  Allergies  Allergen Reactions   Kenalog  [Triamcinolone] Itching   Clindamycin/Lincomycin Rash   EKGs/Labs/Other Studies Reviewed:   The following studies were reviewed today:  Cardiac Monitor 01/10/2022  NSR with sinus brady and sinus tachycardia Occaisional PAC's and PVC's No VT or SVT No atrial fib or prolonged pauses   Donna Taylor,MD   Patch Wear Time:  4 days and 5 hours (2023-12-04T15:03:48-0500 to 2023-12-08T20:18:33-0500)   Patient had a min HR of 45 bpm, max HR of 123 bpm, and avg HR of 71 bpm. Predominant underlying rhythm was Sinus Rhythm. First Degree  AV Block was present. Isolated SVEs were rare (<1.0%), and no SVE Couplets or SVE Triplets were present. Isolated VEs  were rare (<1.0%, 2593), VE Couplets were rare (<1.0%, 12), and VE Triplets were rare (<1.0%, 1). Ventricular Bigeminy and Trigeminy were present.    Cardiac monitor 05/15/2019 1. NSR with sinus brady and sinus tachycardia 2. Frequent PVC's 3. NSVT, bidirectional 4. No atrial fib 5. No prolonged pauses 6. NS atrial tachycardia   Donna Long,M.D.  EKG:  EKG is  ordered today.  The ekg ordered today demonstrates NSR, rate 80 bpm  Recent Labs: No results  found for requested labs within last 365 days.  Recent Lipid Panel    Component Value Date/Time   CHOL 146 06/30/2018 0547   TRIG 181 (H) 06/30/2018 0547   HDL 38 (L) 06/30/2018 0547   CHOLHDL 3.8 06/30/2018 0547   VLDL 36 06/30/2018 0547   LDLCALC 72 06/30/2018 0547     Home Medications   Current Meds  Medication Sig   acetaminophen (TYLENOL) 500 MG tablet Take 500 mg by mouth every 6 (six) hours as needed.   ibuprofen (ADVIL) 800 MG tablet Take 1 tablet (800 mg total) by mouth 3 (three) times daily. Take with food   metoprolol succinate (TOPROL-XL) 50 MG 24 hr tablet Take 1 tablet (50 mg total) by mouth daily. Take with or immediately following a meal.     Review of Systems      All other systems reviewed and are otherwise negative except as noted above.  Physical Exam    VS:  BP 122/86 (BP Location: Left Arm, Patient Position: Sitting, Cuff Size: Normal)   Pulse 65   Ht 5' 6.5" (1.689 m)   Wt (!) 305 lb 12.8 oz (138.7 kg)   LMP 07/05/2017 (Approximate)   SpO2 98%   BMI 48.62 kg/m  , BMI Body mass index is 48.62 kg/m.  Wt Readings from Last 3 Encounters:  01/25/22 (!) 305 lb 12.8 oz (138.7 kg)  01/10/22 (!) 301 lb 6.4 oz (136.7 kg)  09/08/21 288 lb (130.6 kg)     GEN: Well nourished, well developed, in no acute distress. HEENT: normal. Neck: Supple, no JVD, carotid bruits, or masses. Cardiac: RRR, no murmurs, rubs, or gallops. No clubbing, cyanosis, edema.  Radials/PT 2+ and equal bilaterally.  Respiratory:  Respirations regular and unlabored, clear to auscultation bilaterally. GI: Soft, nontender, nondistended. MS: No deformity or atrophy. Skin: Warm and dry, no rash. Neuro:  Strength and sensation are intact. Psych: Normal affect.  Assessment & Plan    Preop Evaluation  Donna Long's perioperative risk of a major cardiac event is  0.4% according to the Revised Cardiac Risk Index (RCRI).  Therefore, she is at low risk for perioperative complications.   Her  functional capacity is good at  6.05 METs according to the Duke Activity Status Index (DASI). Recommendations: According to ACC/AHA guidelines, no further cardiovascular testing needed.  The patient may proceed to surgery at acceptable risk.     Palpitations/PACs/PVCs/NS SVT -increased metoprolol with resolution of symptoms -recent monitor shows PACs and PVCs (occasional) and no dangerous arrythmias   Hypertension -BP well controlled today in the clinic -continue to keep track at home -continue metoprolol          Disposition: Follow up 3 months with Donna Bunting, MD or APP.  Signed, Sharlene Dory, PA-C 01/25/2022, 11:34 AM [facility]

## 2022-01-25 ENCOUNTER — Encounter: Payer: Self-pay | Admitting: Physician Assistant

## 2022-01-25 ENCOUNTER — Ambulatory Visit: Payer: Medicaid Other | Attending: Physician Assistant | Admitting: Physician Assistant

## 2022-01-25 VITALS — BP 122/86 | HR 65 | Ht 66.5 in | Wt 305.8 lb

## 2022-01-25 DIAGNOSIS — Z01810 Encounter for preprocedural cardiovascular examination: Secondary | ICD-10-CM | POA: Diagnosis not present

## 2022-01-25 DIAGNOSIS — I493 Ventricular premature depolarization: Secondary | ICD-10-CM

## 2022-01-25 DIAGNOSIS — I491 Atrial premature depolarization: Secondary | ICD-10-CM | POA: Diagnosis not present

## 2022-01-25 DIAGNOSIS — I10 Essential (primary) hypertension: Secondary | ICD-10-CM

## 2022-01-25 DIAGNOSIS — R002 Palpitations: Secondary | ICD-10-CM | POA: Diagnosis not present

## 2022-01-25 NOTE — Patient Instructions (Signed)
Medication Instructions:  Your physician recommends that you continue on your current medications as directed. Please refer to the Current Medication list given to you today.  *If you need a refill on your cardiac medications before your next appointment, please call your pharmacy*   Lab Work: None  If you have labs (blood work) drawn today and your tests are completely normal, you will receive your results only by: MyChart Message (if you have MyChart) OR A paper copy in the mail If you have any lab test that is abnormal or we need to change your treatment, we will call you to review the results.   Follow-Up: At  [facility]Care, you and your health needs are our priority.  As part of our continuing mission to provide you with exceptional heart care, we have created designated Provider Care Teams.  These Care Teams include your primary Cardiologist (physician) and Advanced Practice Providers (APPs -  Physician Assistants and Nurse Practitioners) who all work together to provide you with the care you need, when you need it.   Your next appointment:   3 month(s)  The format for your next appointment:   In Person  Provider:   Gregg Taylor, MD   Important Information About Sugar       

## 2022-02-16 ENCOUNTER — Encounter (HOSPITAL_BASED_OUTPATIENT_CLINIC_OR_DEPARTMENT_OTHER): Payer: Self-pay | Admitting: Orthopaedic Surgery

## 2022-02-16 ENCOUNTER — Other Ambulatory Visit: Payer: Self-pay

## 2022-02-16 NOTE — Progress Notes (Signed)
   02/16/22 1129  Pre-op Phone Call  Surgery Date Verified 02/24/22  Arrival Time Verified 0815  Surgery Location Verified [facility] [place]  Medical History Reviewed Yes  Is the patient taking a GLP-1 receptor agonist? No  Does the patient have diabetes? No diagnosis of diabetes  Do you have a history of heart problems? Chauncey Cruel)  Yes  Cardiologist Name [name] MD  Have you ever had tests on your heart? (S)  Yes  What cardiac tests were performed? (S)  Echo;EKG;Other (comment) (ablation for SVT- no longer having episodes)  What date/year were cardiac tests completed? 2020  Results viewable: (S)  [facility] Media Tab  Patient educated on enhanced recovery. N/A  Patient educated about smoking cessation 24 hours prior to surgery. N/A Non-Smoker  Patient verbalizes understanding of bowel prep? N/A  Med Rec Completed Yes  Take the Following Meds the Morning of Surgery metoprolol DOS with water  Recent  Lab Work, EKG, CXR? (S)  Yes  NPO (Including gum & candy) After midnight  Allowed clear liquids Water  Patient instructed to stop clear liquids including Carb loading drink at: 0600  Stop Solids, Milk, Candy, and Gum STARTING AT MIDNIGHT  Responsible adult to drive and be with you for 24 hours? Yes  Name & Phone Number for Ride/Caregiver son Ysidro Evert [phone]  No Jewelry, money, nail polish or make-up.  No lotions, powders, perfumes. No shaving  48 hrs. prior to surgery. Yes  Contacts, Dentures & Glasses Will Have to be Removed Before OR. Yes  Please bring your ID and Insurance Card the morning of your surgery. (Surgery Centers Only) Yes  Bring any papers or x-rays with you that your surgeon gave you. Yes  Instructed to contact the location of procedure/ provider if they or anyone in their household develops symptoms or tests positive for COVID-19, has close contact with someone who tests positive for COVID, or has known exposure to any contagious illness. Yes  Call this number the morning of surgery  with  any problems that may cancel your surgery. [phone]  Covid-19 Assessment  Have you had a positive COVID-19 test within the previous 90 days? No  COVID Testing Guidance Proceed with the additional questions.  Patient's surgery required a COVID-19 test (cardiothoracic, complex ENT, and bronchoscopies/ EBUS) No  Have you been unmasked and in close contact with anyone with COVID-19 or COVID-19 symptoms within the past 10 days? No  Do you or anyone in your household currently have any COVID-19 symptoms? No

## 2022-02-16 NOTE — Progress Notes (Signed)
   02/16/22 1129  PAT Phone Screen  Is the patient taking a GLP-1 receptor agonist? No  Do You Have Diabetes? No  Do You Have Hypertension? Yes  Have You Ever Been to the ER for Asthma? No  Have You Taken Oral Steroids in the Past 3 Months? No  Do you Take Phenteramine or any Other Diet Drugs? No  Recent  Lab Work, EKG, CXR? (S)  Yes  Do you have a history of heart problems? Chauncey Cruel)  Yes  Cardiologist Name [name] MD  Have you ever had tests on your heart? (S)  Yes  What cardiac tests were performed? (S)  Echo;EKG;Other (comment) (ablation for SVT- no longer having episodes)  What date/year were cardiac tests completed? 2020  Results viewable: (S)  [facility] Media Tab  Any Recent Hospitalizations? No  Height 5' 6.5" (1.689 m)  Weight (!) 138.3 kg  Pat Appointment Scheduled No  Reason for No Appointment Not Needed

## 2022-02-22 NOTE — H&P (Signed)
PREOPERATIVE H&P  Chief Complaint: RIGHT SHOULDER CARTILAGE DISORDER, OA, IMPINGEMENT SYNDROME, BICEPS TENDINITIS  HPI: Donna Long is a 52 y.o. female who is scheduled for, Procedure(s): SHOULDER ARTHROSCOPY WITH SUBACROMIAL DECOMPRESSION AND BICEP TENDON REPAIR SHOULDER ARTHROSCOPY WITH DISTAL CLAVICLE EXCISION.   Patient has a past medical history significant for HTN, GERD, sleep apnea.   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  She has been out since the injury.  She has been to see [facility] and treated with an X-ray. She has been in physical therapy. She is frustrated by her shoulder.  She has not been able to really raise it overhead.  She has tried and failed non-operative measures.    Symptoms are rated as moderate to severe, and have been worsening.  This is significantly impairing activities of daily living.    Please see clinic note for further details on this patient's care.    She has elected for surgical management.   Past Medical History:  Diagnosis Date   Anemia    d/t gynecological issues   Anxiety    Arthritis    Back injury    Blood transfusion without reported diagnosis    Chronic back pain    Depression    Dysrhythmia    HX SVT   Fatty liver    GERD (gastroesophageal reflux disease)    Hypertension    Sleep apnea    SVT (supraventricular tachycardia)    Past Surgical History:  Procedure Laterality Date   DILITATION & CURRETTAGE/HYSTROSCOPY WITH NOVASURE ABLATION N/A 08/16/2017   Procedure: DILATATION & CURETTAGE/HYSTEROSCOPY WITH MINERVA  ENDOMETRIAL ABLATION;  Surgeon: Florian Buff, MD;  Location: [facility];  Service: Gynecology;  Laterality: N/A;   SVT ABLATION N/A 11/16/2018   Procedure: SVT ABLATION;  Surgeon: Evans Lance, MD;  Location: [facility];  Service: Cardiovascular;  Laterality: N/A;   TUBAL LIGATION     VAGINAL  HYSTERECTOMY N/A 10/17/2018   Procedure: HYSTERECTOMY VAGINAL;  Surgeon: Florian Buff, MD;  Location: [facility];  Service: Gynecology;  Laterality: N/A;   WRIST SURGERY Right    Social History   Socioeconomic History   Marital status: Single    Spouse name: Not on file   Number of children: 4   Years of education: Not on file   Highest education level: Not on file  Occupational History   Not on file  Tobacco Use   Smoking status: Former    Packs/day: 1.00    Years: 33.00    Total pack years: 33.00    Types: Cigarettes    Quit date: 04/02/2008    Years since quitting: 13.9   Smokeless tobacco: Never  Vaping Use   Vaping Use: Former  Substance and Sexual Activity   Alcohol use: No   Drug use: No   Sexual activity: Not Currently    Birth control/protection: Surgical  Other Topics Concern   Not on file  Social History Narrative   Not on file   Social Determinants of Health   Financial Resource Strain: Not on file  Food Insecurity: Not on file  Transportation Needs: Not on file  Physical Activity: Not on file  Stress: Not on file  Social Connections: Not on file   Family History  Problem Relation Age of Onset   Hypertension Mother    Heart disease Mother  COPD Father    Hypertension Maternal Aunt    Diabetes Maternal Grandmother    Kidney disease Sister    Colon cancer Neg Hx    Esophageal cancer Neg Hx    Rectal cancer Neg Hx    Stomach cancer Neg Hx    Allergies  Allergen Reactions   Kenalog  [Triamcinolone] Itching   Clindamycin/Lincomycin Rash   Prior to Admission medications   Medication Sig Start Date End Date Taking? Authorizing Provider  acetaminophen (TYLENOL) 500 MG tablet Take 500 mg by mouth every 6 (six) hours as needed.   Yes [provider]  ibuprofen (ADVIL) 800 MG tablet Take 1 tablet (800 mg total) by mouth 3 (three) times daily. Take with food 09/08/21  Yes Triplett, Tammy, PA-C  Lysine 500 MG CAPS Take by mouth.   Yes [provider]  metoprolol succinate (TOPROL-XL) 50 MG 24 hr tablet Take 1 tablet (50 mg total) by mouth daily. Take with or immediately following a meal. 01/10/22  Yes Conte, Tessa N, PA-C    ROS: All other systems have been reviewed and were otherwise negative with the exception of those mentioned in the HPI and as above.  Physical Exam: General: Alert, no acute distress Cardiovascular: No pedal edema Respiratory: No cyanosis, no use of accessory musculature GI: No organomegaly, abdomen is soft and non-tender Skin: No lesions in the area of chief complaint Neurologic: Sensation intact distally Psychiatric: Patient is competent for consent with normal mood and affect Lymphatic: No axillary or cervical lymphadenopathy  MUSCULOSKELETAL:  Range of motion of the shoulder to about 90 degrees.  She is extremely limited with further motion secondary to the pain.  She is tender to palpation diffusely about the shoulder, including the AC joint.  She has positive impingement and positive O'Brien's, though the test is quite difficult to perform.    Imaging: Reviewed MRI which demonstrates a Type II SLAP tear, AC arthrosis and impingement.  Intact cuff.    Assessment: RIGHT SHOULDER CARTILAGE DISORDER, OA, IMPINGEMENT SYNDROME, BICEPS TENDINITIS  Plan: Plan for Procedure(s): SHOULDER ARTHROSCOPY WITH SUBACROMIAL DECOMPRESSION AND BICEP TENDON REPAIR SHOULDER ARTHROSCOPY WITH DISTAL CLAVICLE EXCISION  The risks benefits and alternatives were discussed with the patient including but not limited to the risks of nonoperative treatment, versus surgical intervention including infection, bleeding, nerve injury,  blood clots, cardiopulmonary complications, morbidity, mortality, among others, and they were willing to proceed.   The patient acknowledged the explanation, agreed to proceed with the plan and consent was signed.   She received operative clearance from her cardiologist, Dr. [name].    Operative Plan: Right shoulder scope with subacromial decompression, distal clavicle excision and biceps tenodesis  Discharge Medications: Standard DVT Prophylaxis: None Physical Therapy: outpatient PT Special Discharge needs: Sling. [place] N Nefertari Rebman, PA-C  02/22/2022 8:04 PM

## 2022-02-23 NOTE — Discharge Instructions (Addendum)
Ophelia Charter MD, MPH Noemi Chapel, PA-C [place] [address], [address] [phone] (tel)   [phone] (fax)   POST-OPERATIVE INSTRUCTIONS - SHOULDER ARTHROSCOPY  WOUND CARE You may remove the Operative Dressing on Post-Op Day #3 (72hrs after surgery).   Alternatively if you would like you can leave dressing on until follow-up if within 7-8 days but keep it dry. Leave steri-strips in place until they fall off on their own, usually 2 weeks postop. There may be a small amount of fluid/bleeding leaking at the surgical site.  This is normal; the shoulder is filled with fluid during the procedure and can leak for 24-48hrs after surgery.  You may change/reinforce the bandage as needed.  Use the Cryocuff or Ice as often as possible for the first 7 days, then as needed for pain relief. Always keep a towel, ACE wrap or other barrier between the cooling unit and your skin.  You may shower on Post-Op Day #3. Gently pat the area dry.  Do not soak the shoulder in water or submerge it.  Keep incisions as dry as possible. Do not go swimming in the pool or ocean until 4 weeks after surgery or when otherwise instructed.    EXERCISES Wear the sling at all times  You may remove the sling for showering, but keep the arm across the chest or in a secondary sling.     It is normal for your fingers/hand to become more swollen after surgery and discolored from bruising.   This will resolve over the first few weeks usually after surgery. Please continue to ambulate and do not stay sitting or lying for too long.  Perform foot and wrist pumps to assist in circulation.  PHYSICAL THERAPY - You will begin physical therapy soon after surgery (unless otherwise specified) - Please call to set up an appointment, if you do not already have one  - Let our office if there are any issues with scheduling your therapy  - You have a physical therapy appointment scheduled at [facility] (across  the hall from our office) on Monday January 22nd at 10:15   [place] ([place]) The anesthesia team may have performed a nerve block for you this is a great tool used to minimize pain.   The block may start wearing off overnight (between 8-24 hours postop) When the block wears off, your pain may go from nearly zero to the pain you would have had postop without the block. This is an abrupt transition but nothing dangerous is happening.   This can be a challenging period but utilize your as needed pain medications to try and manage this period. We suggest you use the pain medication the first night prior to going to bed, to ease this transition.  You may take an extra dose of narcotic when this happens if needed  POST-OP MEDICATIONS- Multimodal approach to pain control In general your pain will be controlled with a combination of substances.  Prescriptions unless otherwise discussed are electronically sent to your pharmacy.  This is a carefully made plan we use to minimize narcotic use.     Celebrex - Anti-inflammatory medication taken on a scheduled basis Acetaminophen - Non-narcotic pain medicine taken on a scheduled basis  Gabapentin - this is to help with nerve based pain, take on a scheduled basis Oxycodone - This is a strong narcotic, to be used only on an "as needed" basis for SEVERE pain. Zofran - take as needed for nausea  FOLLOW-UP If you develop a Fever (?101.5), Redness or Drainage from the surgical incision site, please call our office to arrange for an evaluation. Please call the office to schedule a follow-up appointment for your first post-operative appointment, 7-10 days post-operatively.    HELPFUL INFORMATION   You may be more comfortable sleeping in a semi-seated position the first few nights following surgery.  Keep a pillow propped under the elbow and forearm for comfort.  If you have a recliner type of chair it might be beneficial.  If not that is  fine too, but it would be helpful to sleep propped up with pillows behind your operated shoulder as well under your elbow and forearm.  This will reduce pulling on the suture lines.  When dressing, put your operative arm in the sleeve first.  When getting undressed, take your operative arm out last.  Loose fitting, button-down shirts are recommended.  Often in the first days after surgery you may be more comfortable keeping your operative arm under your shirt and not through the sleeve.  You may return to work/school in the next couple of days when you feel up to it.  Desk work and typing in the sling is fine.  We suggest you use the pain medication the first night prior to going to bed, in order to ease any pain when the anesthesia wears off. You should avoid taking pain medications on an empty stomach as it will make you nauseous.  You should wean off your narcotic medicines as soon as you are able.  Most patients will be off or using minimal narcotics before their first postop appointment.   Do not drink alcoholic beverages or take illicit drugs when taking pain medications.  It is against the law to drive while taking narcotics.  In some states it is against the law to drive while your arm is in a sling.   Pain medication may make you constipated.  Below are a few solutions to try in this order: Decrease the amount of pain medication if you aren't having pain. Drink lots of decaffeinated fluids. Drink prune juice and/or eat dried prunes  If the first 3 don't work start with additional solutions Take Colace - an over-the-counter stool softener Take Senokot - an over-the-counter laxative Take Miralax - a stronger over-the-counter laxative  For more information including helpful videos and documents visit our website:   [email or web address]    Post Anesthesia Home Care Instructions  Activity: Get plenty of rest for the remainder of the day. A  responsible individual must stay with you for 24 hours following the procedure.  For the next 24 hours, DO NOT: -Drive a car -Paediatric nurse -Drink alcoholic beverages -Take any medication unless instructed by your physician -Make any legal decisions or sign important papers.  Meals: Start with liquid foods such as gelatin or soup. Progress to regular foods as tolerated. Avoid greasy, spicy, heavy foods. If nausea and/or vomiting occur, drink only clear liquids until the nausea and/or vomiting subsides. Call your physician if vomiting continues.  Special Instructions/Symptoms: Your throat may feel dry or sore from the anesthesia or the breathing tube placed in your throat during surgery. If this causes discomfort, gargle with warm salt water. The discomfort should disappear within 24 hours.  Tylenol can be taken after 2:50 pm    Information for Discharge Teaching: EXPAREL (bupivacaine liposome injectable suspension)   Your surgeon or anesthesiologist gave you EXPAREL(bupivacaine) to help control your pain after surgery.  EXPAREL is  a local anesthetic that provides pain relief by numbing the tissue around the surgical site. EXPAREL is designed to release pain medication over time and can control pain for up to 72 hours. Depending on how you respond to EXPAREL, you may require less pain medication during your recovery.  Possible side effects: Temporary loss of sensation or ability to move in the area where bupivacaine was injected. Nausea, vomiting, constipation Rarely, numbness and tingling in your mouth or lips, lightheadedness, or anxiety may occur. Call your doctor right away if you think you may be experiencing any of these sensations, or if you have other questions regarding possible side effects.  Follow all other discharge instructions given to you by your surgeon or nurse. Eat a healthy diet and drink plenty of water or other fluids.  If you return to the hospital for any  reason within 96 hours following the administration of EXPAREL, it is important for health care providers to know that you have received this anesthetic. A teal colored band has been placed on your arm with the date, time and amount of EXPAREL you have received in order to alert and inform your health care providers. Please leave this armband in place for the full 96 hours following administration, and then you may remove the band.

## 2022-02-24 ENCOUNTER — Ambulatory Visit (HOSPITAL_BASED_OUTPATIENT_CLINIC_OR_DEPARTMENT_OTHER): Payer: Worker's Compensation | Admitting: Anesthesiology

## 2022-02-24 ENCOUNTER — Encounter (HOSPITAL_BASED_OUTPATIENT_CLINIC_OR_DEPARTMENT_OTHER)
Admission: RE | Disposition: A | Discharge status: Home / Self Care | Payer: Self-pay | Source: Home / Self Care | Attending: Orthopaedic Surgery

## 2022-02-24 ENCOUNTER — Ambulatory Visit (HOSPITAL_BASED_OUTPATIENT_CLINIC_OR_DEPARTMENT_OTHER)
Admission: RE | Admit: 2022-02-24 | Discharge: 2022-02-24 | Disposition: A | Discharge status: Home / Self Care | Payer: Worker's Compensation | Attending: Orthopaedic Surgery | Admitting: Orthopaedic Surgery

## 2022-02-24 ENCOUNTER — Encounter (HOSPITAL_BASED_OUTPATIENT_CLINIC_OR_DEPARTMENT_OTHER): Payer: Self-pay | Admitting: Orthopaedic Surgery

## 2022-02-24 DIAGNOSIS — S43431A Superior glenoid labrum lesion of right shoulder, initial encounter: Secondary | ICD-10-CM

## 2022-02-24 DIAGNOSIS — M19011 Primary osteoarthritis, right shoulder: Secondary | ICD-10-CM | POA: Diagnosis not present

## 2022-02-24 DIAGNOSIS — Z79899 Other long term (current) drug therapy: Secondary | ICD-10-CM | POA: Insufficient documentation

## 2022-02-24 DIAGNOSIS — Y990 Civilian activity done for income or pay: Secondary | ICD-10-CM | POA: Diagnosis not present

## 2022-02-24 DIAGNOSIS — Z6841 Body Mass Index (BMI) 40.0 and over, adult: Secondary | ICD-10-CM | POA: Insufficient documentation

## 2022-02-24 DIAGNOSIS — K219 Gastro-esophageal reflux disease without esophagitis: Secondary | ICD-10-CM | POA: Diagnosis not present

## 2022-02-24 DIAGNOSIS — M7541 Impingement syndrome of right shoulder: Secondary | ICD-10-CM | POA: Insufficient documentation

## 2022-02-24 DIAGNOSIS — M7521 Bicipital tendinitis, right shoulder: Secondary | ICD-10-CM | POA: Diagnosis not present

## 2022-02-24 DIAGNOSIS — I10 Essential (primary) hypertension: Secondary | ICD-10-CM | POA: Insufficient documentation

## 2022-02-24 DIAGNOSIS — Z01818 Encounter for other preprocedural examination: Secondary | ICD-10-CM

## 2022-02-24 DIAGNOSIS — G4730 Sleep apnea, unspecified: Secondary | ICD-10-CM | POA: Insufficient documentation

## 2022-02-24 DIAGNOSIS — S46011A Strain of muscle(s) and tendon(s) of the rotator cuff of right shoulder, initial encounter: Secondary | ICD-10-CM | POA: Diagnosis not present

## 2022-02-24 DIAGNOSIS — Z87891 Personal history of nicotine dependence: Secondary | ICD-10-CM | POA: Insufficient documentation

## 2022-02-24 DIAGNOSIS — Z09 Encounter for follow-up examination after completed treatment for conditions other than malignant neoplasm: Secondary | ICD-10-CM | POA: Insufficient documentation

## 2022-02-24 DIAGNOSIS — M75121 Complete rotator cuff tear or rupture of right shoulder, not specified as traumatic: Secondary | ICD-10-CM | POA: Diagnosis not present

## 2022-02-24 HISTORY — PX: SHOULDER ARTHROSCOPY WITH SUBACROMIAL DECOMPRESSION AND BICEP TENDON REPAIR: SHX5689

## 2022-02-24 HISTORY — PX: SHOULDER ARTHROSCOPY WITH DISTAL CLAVICLE RESECTION: SHX5675

## 2022-02-24 SURGERY — SHOULDER ARTHROSCOPY WITH SUBACROMIAL DECOMPRESSION AND BICEP TENDON REPAIR
Anesthesia: General | Site: Shoulder | Laterality: Right

## 2022-02-24 MED ORDER — GABAPENTIN 300 MG PO CAPS
300.0000 mg | ORAL_CAPSULE | Freq: Once | ORAL | Status: AC
  Administered 2022-02-24: 300 mg via ORAL

## 2022-02-24 MED ORDER — SUGAMMADEX SODIUM 500 MG/5ML IV SOLN
INTRAVENOUS | Status: AC
  Filled 2022-02-24: qty 5

## 2022-02-24 MED ORDER — ONDANSETRON HCL 4 MG/2ML IJ SOLN
INTRAMUSCULAR | Status: AC
  Filled 2022-02-24: qty 2

## 2022-02-24 MED ORDER — SUCCINYLCHOLINE CHLORIDE 200 MG/10ML IV SOSY
PREFILLED_SYRINGE | INTRAVENOUS | Status: DC | PRN
  Administered 2022-02-24: 140 mg via INTRAVENOUS

## 2022-02-24 MED ORDER — MIDAZOLAM HCL 2 MG/2ML IJ SOLN
INTRAMUSCULAR | Status: AC
  Filled 2022-02-24: qty 2

## 2022-02-24 MED ORDER — BUPIVACAINE LIPOSOME 1.3 % IJ SUSP
INTRAMUSCULAR | Status: DC | PRN
  Administered 2022-02-24: 10 mL via PERINEURAL

## 2022-02-24 MED ORDER — ACETAMINOPHEN 500 MG PO TABS
1000.0000 mg | ORAL_TABLET | Freq: Once | ORAL | Status: AC
  Administered 2022-02-24: 1000 mg via ORAL

## 2022-02-24 MED ORDER — FENTANYL CITRATE (PF) 100 MCG/2ML IJ SOLN
INTRAMUSCULAR | Status: AC
  Filled 2022-02-24: qty 2

## 2022-02-24 MED ORDER — FENTANYL CITRATE (PF) 100 MCG/2ML IJ SOLN: 25.0000 ug | INTRAMUSCULAR | Status: DC | PRN

## 2022-02-24 MED ORDER — BUPIVACAINE HCL (PF) 0.5 % IJ SOLN
INTRAMUSCULAR | Status: DC | PRN
  Administered 2022-02-24: 15 mL via PERINEURAL

## 2022-02-24 MED ORDER — FENTANYL CITRATE (PF) 100 MCG/2ML IJ SOLN
INTRAMUSCULAR | Status: DC | PRN
  Administered 2022-02-24: 100 ug via INTRAVENOUS

## 2022-02-24 MED ORDER — ACETAMINOPHEN 500 MG PO TABS
ORAL_TABLET | ORAL | Status: AC
  Filled 2022-02-24: qty 2

## 2022-02-24 MED ORDER — ONDANSETRON HCL 4 MG PO TABS: 4.0000 mg | ORAL_TABLET | Freq: Three times a day (TID) | ORAL | 0 refills | Status: AC | PRN

## 2022-02-24 MED ORDER — SODIUM CHLORIDE 0.9 % IR SOLN
Status: DC | PRN
  Administered 2022-02-24: 6000 mL

## 2022-02-24 MED ORDER — GABAPENTIN 300 MG PO CAPS
ORAL_CAPSULE | ORAL | Status: AC
  Filled 2022-02-24: qty 1

## 2022-02-24 MED ORDER — OXYCODONE HCL 5 MG PO TABS: ORAL_TABLET | ORAL | 0 refills | Status: AC

## 2022-02-24 MED ORDER — DEXAMETHASONE SODIUM PHOSPHATE 4 MG/ML IJ SOLN
INTRAMUSCULAR | Status: DC | PRN
  Administered 2022-02-24: 5 mg via INTRAVENOUS

## 2022-02-24 MED ORDER — ROCURONIUM BROMIDE 100 MG/10ML IV SOLN
INTRAVENOUS | Status: DC | PRN
  Administered 2022-02-24: 40 mg via INTRAVENOUS

## 2022-02-24 MED ORDER — PROPOFOL 10 MG/ML IV BOLUS
INTRAVENOUS | Status: DC | PRN
  Administered 2022-02-24: 200 mg via INTRAVENOUS

## 2022-02-24 MED ORDER — LIDOCAINE HCL (CARDIAC) PF 100 MG/5ML IV SOSY
PREFILLED_SYRINGE | INTRAVENOUS | Status: DC | PRN
  Administered 2022-02-24: 100 mg via INTRAVENOUS

## 2022-02-24 MED ORDER — ROCURONIUM BROMIDE 10 MG/ML (PF) SYRINGE
PREFILLED_SYRINGE | INTRAVENOUS | Status: AC
  Filled 2022-02-24: qty 10

## 2022-02-24 MED ORDER — OXYCODONE HCL 5 MG PO TABS: 5.0000 mg | ORAL_TABLET | Freq: Once | ORAL | Status: DC | PRN

## 2022-02-24 MED ORDER — ACETAMINOPHEN 500 MG PO TABS: 1000.0000 mg | ORAL_TABLET | Freq: Once | ORAL | Status: AC

## 2022-02-24 MED ORDER — MIDAZOLAM HCL 2 MG/2ML IJ SOLN
1.0000 mg | Freq: Once | INTRAMUSCULAR | Status: AC
  Administered 2022-02-24: 1 mg via INTRAVENOUS

## 2022-02-24 MED ORDER — CEFAZOLIN SODIUM-DEXTROSE 2-4 GM/100ML-% IV SOLN
2.0000 g | INTRAVENOUS | Status: AC
  Administered 2022-02-24: 3 g via INTRAVENOUS

## 2022-02-24 MED ORDER — OXYCODONE HCL 5 MG/5ML PO SOLN: 5.0000 mg | Freq: Once | ORAL | Status: DC | PRN

## 2022-02-24 MED ORDER — PROMETHAZINE HCL 25 MG/ML IJ SOLN: 6.2500 mg | INTRAMUSCULAR | Status: DC | PRN

## 2022-02-24 MED ORDER — CEFAZOLIN IN SODIUM CHLORIDE 3-0.9 GM/100ML-% IV SOLN
INTRAVENOUS | Status: AC
  Filled 2022-02-24: qty 100

## 2022-02-24 MED ORDER — LACTATED RINGERS IV SOLN: INTRAVENOUS | Status: DC

## 2022-02-24 MED ORDER — CELECOXIB 100 MG PO CAPS: 100.0000 mg | ORAL_CAPSULE | Freq: Two times a day (BID) | ORAL | 0 refills | Status: AC

## 2022-02-24 MED ORDER — PROPOFOL 10 MG/ML IV BOLUS
INTRAVENOUS | Status: AC
  Filled 2022-02-24: qty 20

## 2022-02-24 MED ORDER — GABAPENTIN 100 MG PO CAPS: 100.0000 mg | ORAL_CAPSULE | Freq: Three times a day (TID) | ORAL | 0 refills | Status: AC

## 2022-02-24 MED ORDER — FENTANYL CITRATE (PF) 100 MCG/2ML IJ SOLN
50.0000 ug | Freq: Once | INTRAMUSCULAR | Status: AC
  Administered 2022-02-24: 50 ug via INTRAVENOUS

## 2022-02-24 MED ORDER — DEXAMETHASONE SODIUM PHOSPHATE 10 MG/ML IJ SOLN
INTRAMUSCULAR | Status: AC
  Filled 2022-02-24: qty 1

## 2022-02-24 MED ORDER — ACETAMINOPHEN 500 MG PO TABS: 1000.0000 mg | ORAL_TABLET | Freq: Three times a day (TID) | ORAL | 0 refills | Status: AC

## 2022-02-24 MED ORDER — ONDANSETRON HCL 4 MG/2ML IJ SOLN
INTRAMUSCULAR | Status: DC | PRN
  Administered 2022-02-24: 4 mg via INTRAVENOUS

## 2022-02-24 SURGICAL SUPPLY — 56 items
AID PSTN UNV HD RSTRNT DISP (MISCELLANEOUS) ×1
ANCH SUT 2 FBRTK KNTLS 1.8 (Anchor) ×2 IMPLANT
ANCH SUT SWLK 19.1X4.75 (Anchor) ×1 IMPLANT
ANCHOR SUT 1.8 FIBERTAK SB KL (Anchor) IMPLANT
ANCHOR SUT BIO SW 4.75X19.1 (Anchor) IMPLANT
APL PRP STRL LF DISP 70% ISPRP (MISCELLANEOUS) ×1
BLADE EXCALIBUR 4.0X13 (MISCELLANEOUS) ×1 IMPLANT
BURR OVAL 8 FLU 4.0X13 (MISCELLANEOUS) ×1 IMPLANT
CANNULA 5.75X71 LONG (CANNULA) IMPLANT
CANNULA PASSPORT 5 (CANNULA) IMPLANT
CANNULA PASSPORT BUTTON 10-40 (CANNULA) IMPLANT
CANNULA TWIST IN 8.25X7CM (CANNULA) IMPLANT
CHLORAPREP W/TINT 26 (MISCELLANEOUS) ×1 IMPLANT
CLSR STERI-STRIP ANTIMIC 1/2X4 (GAUZE/BANDAGES/DRESSINGS) ×1 IMPLANT
COOLER ICEMAN CLASSIC (MISCELLANEOUS) ×1 IMPLANT
DRAPE IMP U-DRAPE 54X76 (DRAPES) ×1 IMPLANT
DRAPE INCISE IOBAN 66X45 STRL (DRAPES) IMPLANT
DRAPE SHOULDER BEACH CHAIR (DRAPES) ×1 IMPLANT
DW OUTFLOW CASSETTE/TUBE SET (MISCELLANEOUS) ×1 IMPLANT
GAUZE PAD ABD 8X10 STRL (GAUZE/BANDAGES/DRESSINGS) ×1 IMPLANT
GAUZE SPONGE 4X4 12PLY STRL (GAUZE/BANDAGES/DRESSINGS) ×1 IMPLANT
GLOVE BIO SURGEON STRL SZ 6.5 (GLOVE) ×1 IMPLANT
GLOVE BIOGEL PI IND STRL 6.5 (GLOVE) ×1 IMPLANT
GLOVE BIOGEL PI IND STRL 8 (GLOVE) ×1 IMPLANT
GLOVE ECLIPSE 8.0 STRL XLNG CF (GLOVE) ×1 IMPLANT
GOWN STRL REUS W/ TWL LRG LVL3 (GOWN DISPOSABLE) ×2 IMPLANT
GOWN STRL REUS W/TWL LRG LVL3 (GOWN DISPOSABLE) ×2
GOWN STRL REUS W/TWL XL LVL3 (GOWN DISPOSABLE) ×1 IMPLANT
KIT STABILIZATION SHOULDER (MISCELLANEOUS) ×1 IMPLANT
KIT STR SPEAR 1.8 FBRTK DISP (KITS) IMPLANT
LASSO CRESCENT QUICKPASS (SUTURE) IMPLANT
MANIFOLD NEPTUNE II (INSTRUMENTS) ×1 IMPLANT
NDL SAFETY ECLIP 18X1.5 (MISCELLANEOUS) ×1 IMPLANT
NDL SCORPION MULTI FIRE (NEEDLE) IMPLANT
NEEDLE SCORPION MULTI FIRE (NEEDLE) IMPLANT
PACK ARTHROSCOPY DSU (CUSTOM PROCEDURE TRAY) ×1 IMPLANT
PACK BASIN DAY SURGERY FS (CUSTOM PROCEDURE TRAY) ×1 IMPLANT
PAD COLD SHLDR WRAP-ON (PAD) ×1 IMPLANT
PORT APPOLLO RF 90DEGREE MULTI (SURGICAL WAND) ×1 IMPLANT
RESTRAINT HEAD UNIVERSAL NS (MISCELLANEOUS) ×1 IMPLANT
SHEET MEDIUM DRAPE 40X70 STRL (DRAPES) ×1 IMPLANT
SLEEVE SCD COMPRESS KNEE MED (STOCKING) ×1 IMPLANT
SLING ARM FOAM STRAP LRG (SOFTGOODS) IMPLANT
SUT FIBERWIRE #2 38 T-5 BLUE (SUTURE)
SUT MNCRL AB 4-0 PS2 18 (SUTURE) ×1 IMPLANT
SUT PDS AB 1 CT  36 (SUTURE)
SUT PDS AB 1 CT 36 (SUTURE) IMPLANT
SUT TIGER TAPE 7 IN WHITE (SUTURE) IMPLANT
SUTURE FIBERWR #2 38 T-5 BLUE (SUTURE) IMPLANT
SUTURE TAPE TIGERLINK 1.3MM BL (SUTURE) IMPLANT
SUTURETAPE TIGERLINK 1.3MM BL (SUTURE)
SYR 5ML LL (SYRINGE) ×1 IMPLANT
TAPE FIBER 2MM 7IN #2 BLUE (SUTURE) IMPLANT
TOWEL GREEN STERILE FF (TOWEL DISPOSABLE) ×2 IMPLANT
TUBE CONNECTING 20X1/4 (TUBING) ×1 IMPLANT
TUBING ARTHROSCOPY IRRIG 16FT (MISCELLANEOUS) ×1 IMPLANT

## 2022-02-24 NOTE — Interval H&P Note (Signed)
All questions answered, patient wants to proceed with procedure. ? ?

## 2022-02-24 NOTE — Anesthesia Procedure Notes (Signed)
Procedure Name: Intubation Date/Time: 02/24/2022 11:40 AM  Performed by: Verita Lamb, CRNAPre-anesthesia Checklist: Patient identified, Emergency Drugs available, Suction available and Patient being monitored Patient Re-evaluated:Patient Re-evaluated prior to induction Oxygen Delivery Method: Circle system utilized Preoxygenation: Pre-oxygenation with 100% oxygen Induction Type: IV induction Ventilation: Mask ventilation without difficulty Laryngoscope Size: Mac and 4 Grade View: Grade I Tube type: Oral Tube size: 7.0 mm Number of attempts: 1 Airway Equipment and Method: Stylet and Oral airway Placement Confirmation: ETT inserted through vocal cords under direct vision, positive ETCO2 and breath sounds checked- equal and bilateral Secured at: 23 cm Tube secured with: Tape Dental Injury: Teeth and Oropharynx as per pre-operative assessment

## 2022-02-24 NOTE — Anesthesia Postprocedure Evaluation (Signed)
Anesthesia Post Note  Patient: Donna Long  Procedure(s) Performed: SHOULDER ARTHROSCOPY WITH SUBACROMIAL DECOMPRESSION AND BICEP TENDON REPAIR (Right: Shoulder) SHOULDER ARTHROSCOPY WITH DISTAL CLAVICLE EXCISION (Right: Shoulder)     Patient location during evaluation: PACU Anesthesia Type: General Level of consciousness: awake and alert Pain management: pain level controlled Vital Signs Assessment: post-procedure vital signs reviewed and stable Respiratory status: spontaneous breathing, nonlabored ventilation and respiratory function stable Cardiovascular status: stable and blood pressure returned to baseline Anesthetic complications: no   No notable events documented.  Last Vitals:  Vitals:   02/24/22 1300 02/24/22 1315  BP: (!) 149/84 (!) 129/106  Pulse: 61 (!) 55  Resp: 14 15  Temp:    SpO2: 94% 94%    Last Pain:  Vitals:   02/24/22 1341  TempSrc:   PainSc: [place] Rakeb Kibble

## 2022-02-24 NOTE — Transfer of Care (Signed)
Immediate Anesthesia Transfer of Care Note  Patient: Donna Long  Procedure(s) Performed: SHOULDER ARTHROSCOPY WITH SUBACROMIAL DECOMPRESSION AND BICEP TENDON REPAIR (Right: Shoulder) SHOULDER ARTHROSCOPY WITH DISTAL CLAVICLE EXCISION (Right: Shoulder)  Patient Location: PACU  Anesthesia Type:General and Regional  Level of Consciousness: awake, alert , and oriented  Airway & Oxygen Therapy: Patient Spontanous Breathing and Patient connected to face mask oxygen  Post-op Assessment: Report given to RN and Post -op Vital signs reviewed and stable  Post vital signs: Reviewed and stable  Last Vitals:  Vitals Value Taken Time  BP 139/95 02/24/22 1230  Temp    Pulse 72 02/24/22 1230  Resp    SpO2 98 % 02/24/22 1230  Vitals shown include unvalidated device data.  Last Pain:  Vitals:   02/24/22 0946  TempSrc: Oral  PainSc: 5       Patients Stated Pain Goal: 4 (86/57/84 6962)  Complications: No notable events documented.

## 2022-02-24 NOTE — Anesthesia Preprocedure Evaluation (Addendum)
Anesthesia Evaluation  Patient identified by MRN, date of birth, ID band Patient awake    Reviewed: Allergy & Precautions, NPO status , Patient's Chart, lab work & pertinent test results, reviewed documented beta blocker date and time   History of Anesthesia Complications Negative for: history of anesthetic complications  Airway Mallampati: III  TM Distance: >3 FB Neck ROM: Full    Dental  (+) Dental Advisory Given, Partial Upper, Partial Lower   Pulmonary sleep apnea and Continuous Positive Airway Pressure Ventilation , former smoker   Pulmonary exam normal        Cardiovascular hypertension, Pt. on medications and Pt. on home beta blockers + dysrhythmias Supra Ventricular Tachycardia  Rhythm:Regular Rate:Bradycardia     Neuro/Psych  PSYCHIATRIC DISORDERS Anxiety Depression    negative neurological ROS     GI/Hepatic Neg liver ROS,GERD  Controlled and Medicated,,  Endo/Other    Morbid obesity  Renal/GU negative Renal ROS     Musculoskeletal  (+) Arthritis ,    Abdominal   Peds  Hematology negative hematology ROS (+)   Anesthesia Other Findings Chronic back pain   Reproductive/Obstetrics  s/p tubal ligation s/p hysterectomy                              Anesthesia Physical Anesthesia Plan  ASA: 2  Anesthesia Plan: General   Post-op Pain Management: Tylenol PO (pre-op)* and Regional block*   Induction: Intravenous  PONV Risk Score and Plan: 3 and Treatment may vary due to age or medical condition, Ondansetron, Dexamethasone and Midazolam  Airway Management Planned: Oral ETT  Additional Equipment: None  Intra-op Plan:   Post-operative Plan: Extubation in OR  Informed Consent: I have reviewed the patients History and Physical, chart, labs and discussed the procedure including the risks, benefits and alternatives for the proposed anesthesia with the patient or authorized  representative who has indicated his/her understanding and acceptance.     Dental advisory given  Plan Discussed with: CRNA and Anesthesiologist  Anesthesia Plan Comments:        Anesthesia Quick Evaluation

## 2022-02-24 NOTE — Anesthesia Procedure Notes (Signed)
Anesthesia Regional Block: Interscalene brachial plexus block   Pre-Anesthetic Checklist: , timeout performed,  Correct Patient, Correct Site, Correct Laterality,  Correct Procedure, Correct Position, site marked,  Risks and benefits discussed,  Surgical consent,  Pre-op evaluation,  At surgeon's request and post-op pain management  Laterality: Right  Prep: chloraprep       Needles:  Injection technique: Single-shot  Needle Type: Echogenic Needle     Needle Length: 5cm  Needle Gauge: 21     Additional Needles:   Narrative:  Start time: 02/24/2022 10:36 AM End time: 02/24/2022 10:39 AM Injection made incrementally with aspirations every 5 mL.  Performed by: Personally  Anesthesiologist: Audry Pili, MD  Additional Notes: No pain on injection. No increased resistance to injection. Injection made in 5cc increments. Good needle visualization. Patient tolerated the procedure well.

## 2022-02-24 NOTE — Progress Notes (Signed)
Assisted Dr. Brock with right, interscalene , ultrasound guided block. Side rails up, monitors on throughout procedure. See vital signs in flow sheet. Tolerated Procedure well. 

## 2022-02-24 NOTE — Op Note (Signed)
Orthopaedic Surgery Operative Note (CSN: [id number])  Donna Long  January 08, 1971 Date of Surgery: 02/24/2022   DIAGNOSES: Right shoulder, chronic rotator cuff tear, SLAP tear, biceps tendinitis, AC arthritis, and subacromial impingement.  POST-OPERATIVE DIAGNOSIS: same  PROCEDURE: Arthroscopic extensive debridement - 29823 Subdeltoid Bursa, Supraspinatus Tendon, Anterior Labrum, Superior Labrum, and glenoid bone, glenoid cartilage, humeral bone and humeral cartilage Arthroscopic distal clavicle excision - 86578 Arthroscopic subacromial decompression - 46962 Arthroscopic rotator cuff repair - 95284 Arthroscopic biceps tenodesis - 13244   OPERATIVE FINDING: Exam under anesthesia: Normal Articular space:  Anterior and superior labral fraying Chondral surfaces: Normal Biceps:  Type II SLAP tear Subscapularis: Incomplete tear upper border full-thickness upper 10 to 15%. Supraspinatus: Intact mild fraying on the bursal side. Infraspinatus: Intact      Post-operative plan: The patient will be non-weightbearing in a sling for 6 weeks.  The patient will be discharged home.  DVT prophylaxis not indicated in ambulatory upper extremity patient without known risk factors.   Pain control with PRN pain medication preferring oral medicines.  Follow up plan will be scheduled in approximately 7 days for incision check and XR.  Surgeons:Primary: Hiram Gash, MD Assistants:Caroline McBane PA-C Location: [facility] OR ROOM 6 Anesthesia: General with Exparel interscalene block Antibiotics: Ancef 3 g Tourniquet time: None Estimated Blood Loss: Minimal Complications: None Specimens: None Implants: Implant Name Type Inv. Item Serial No. Manufacturer Lot No. LRB No. Used Action  ANCHOR SUT BIO SW 4.75X19.1 - [id number] Anchor ANCHOR SUT BIO SW 4.75X19.1  [employer] [id number] Right 1 Implanted  ANCHOR SUT 1.8 FIBERTAK SB KL - [id number] Anchor ANCHOR SUT 1.8 FIBERTAK SB KL  [employer] [id number] Right 1  Implanted  ANCHOR SUT 1.8 FIBERTAK SB KL - [id number] Anchor ANCHOR SUT 1.8 [employer] [id number] Right 1 Implanted    Indications for Surgery:   Donna Long is a 52 y.o. female with continued shoulder pain refractory to nonoperative measures for extended period of time.    The risks and benefits were explained at length including but not limited to continued pain, cuff failure, biceps tenodesis failure, stiffness, need for further surgery and infection.   Procedure:   Patient was correctly identified in the preoperative holding area and operative site marked.  Patient brought to OR and positioned beachchair on an [place] table ensuring that all bony prominences were padded and the head was in an appropriate location.  Anesthesia was induced and the operative shoulder was prepped and draped in the usual sterile fashion.  Timeout was called preincision.  A standard posterior viewing portal was made after localizing the portal with a spinal needle.  An anterior accessory portal was also made.  After clearing the articular space the camera was positioned in the subacromial space.  Findings above.    Extensive debridement was performed of the anterior interval tissue, labral fraying and the bursa.  Subacromial decompression: We made a lateral portal with spinal needle guidance. We then proceeded to debride bursal tissue extensively with a shaver and arthrocare device. At that point we continued to identify the borders of the acromion and identify the spur. We then carefully preserved the deltoid fascia and used a burr to convert the acromion to a Type 1 flat acromion without issue.  Biceps tenodesis: We marked the tendon and then performed a tenotomy and debridement of the stump in the articular space. We then identified the biceps tendon in its groove suprapec with the arthroscope in the lateral portal taking  care to move from lateral to medial to avoid injury to the subscapularis. At that  point we unroofed the tendon itself and mobilized it. An accessory anterior portal was made in line with the tendon and we grasped it from the anterior superior portal and worked from the accessory anterior portal. Two Fibertak 1.3mm knotless anchors were placed in the groove and the tendon was secured in a luggage loop style fashion with a pass of the limb of suture through the tendon using a scorpion device to avoid pull-through.  Repair was completed with good tension on the tendon.  Residual stump of the tendon was removed after being resected with a RF ablator.  Distal Clavicle resection:  The scope was placed in the subacromial space from the posterior portal.  A hemostat was placed through the anterior portal and we spread at the [facility] joint.  A burr was then inserted and 10 mm of distal clavicle was resected taking care to avoid damage to the capsule around the joint and avoiding overhanging bone posteriorly.    Subscapularis Repair: We identified a subscapularis tear that involved upper 10-15% but was full thickness.  Using a grasping device were able to demonstrate that the tendon could be reapproximated to the lesser tuberosity.  We felt that it was repairable.  We then used a RF ablator to open the rotator interval and released the MGHL to allow further translation of the subscapularis.  This point we cleared both anterior and posterior to the tendon taking care to avoid inferior migration to avoid the neurovascular structures as well as the muscular cutaneous nerve.  We then used a lasso to pass a fiber tape in a mattress fashion through the subscapularis and based 4.75 swivel lock was used to repair back to the lesser tuberosity after prepping the tuberosity extensively.  We had good approximation of the tendon and a recreation of the rolled border.   The incisions were closed with absorbable monocryl and steri strips.  A sterile dressing was placed along with a sling. The patient was awoken from  general anesthesia and taken to the PACU in stable condition without complication.   Noemi Chapel, PA-C, present and scrubbed throughout the case, critical for completion in a timely fashion, and for retraction, instrumentation, closure.

## 2022-02-25 ENCOUNTER — Encounter (HOSPITAL_BASED_OUTPATIENT_CLINIC_OR_DEPARTMENT_OTHER): Payer: Self-pay | Admitting: Orthopaedic Surgery

## 2022-04-02 DIAGNOSIS — J0100 Acute maxillary sinusitis, unspecified: Secondary | ICD-10-CM | POA: Diagnosis not present

## 2022-04-02 DIAGNOSIS — J029 Acute pharyngitis, unspecified: Secondary | ICD-10-CM | POA: Diagnosis not present

## 2022-04-02 DIAGNOSIS — R0981 Nasal congestion: Secondary | ICD-10-CM | POA: Diagnosis not present

## 2022-04-19 ENCOUNTER — Telehealth: Payer: Self-pay | Admitting: Cardiology

## 2022-04-19 ENCOUNTER — Encounter: Payer: Self-pay | Admitting: Internal Medicine

## 2022-04-19 ENCOUNTER — Ambulatory Visit: Payer: Medicaid Other | Attending: Internal Medicine | Admitting: Internal Medicine

## 2022-04-19 VITALS — BP 136/74 | HR 52 | Ht 66.5 in | Wt 299.0 lb

## 2022-04-19 DIAGNOSIS — Z9989 Dependence on other enabling machines and devices: Secondary | ICD-10-CM | POA: Diagnosis not present

## 2022-04-19 NOTE — Progress Notes (Signed)
HPI Donna Long returns today for followup. She is a pleasant morbidly obese 52 yo woman with SVT who underwent EP study and ablation several years ago. She has been bothered by PVC's. She has gained her weight  back. She has had trouble with her CPAP. She has not had syncope. She had shoulder surgery after a work relaed injury.  Allergies  Allergen Reactions   Kenalog  [Triamcinolone] Itching   Clindamycin/Lincomycin Rash     Current Outpatient Medications  Medication Sig Dispense Refill   Acetaminophen Extra Strength 500 MG CAPS Take 2 capsules by mouth every 8 (eight) hours.     aspirin 81 MG chewable tablet Chew by mouth.     esomeprazole (NEXIUM) 20 MG capsule Take 20 mg by mouth daily at 12 noon.     gabapentin (NEURONTIN) 100 MG capsule Take 100 mg by mouth 2 (two) times daily.     Lysine 500 MG CAPS Take by mouth.     metoprolol succinate (TOPROL-XL) 50 MG 24 hr tablet Take 1 tablet (50 mg total) by mouth daily. Take with or immediately following a meal. 90 tablet 3   No current facility-administered medications for this visit.     Past Medical History:  Diagnosis Date   Anemia    d/t gynecological issues   Anxiety    Arthritis    Back injury    Blood transfusion without reported diagnosis    Chronic back pain    Depression    Dysrhythmia    HX SVT   Fatty liver    GERD (gastroesophageal reflux disease)    Hypertension    Sleep apnea    SVT (supraventricular tachycardia)     ROS:   All systems reviewed and negative except as noted in the HPI.   Past Surgical History:  Procedure Laterality Date   DILITATION & CURRETTAGE/HYSTROSCOPY WITH NOVASURE ABLATION N/A 08/16/2017   Procedure: DILATATION & CURETTAGE/HYSTEROSCOPY WITH MINERVA  ENDOMETRIAL ABLATION;  Surgeon: Florian Buff, MD;  Location: [facility];  Service: Gynecology;  Laterality: N/A;   SHOULDER ARTHROSCOPY WITH DISTAL CLAVICLE RESECTION Right 02/24/2022   Procedure: SHOULDER ARTHROSCOPY WITH  DISTAL CLAVICLE EXCISION;  Surgeon: Hiram Gash, MD;  Location: [facility];  Service: Orthopedics;  Laterality: Right;   SHOULDER ARTHROSCOPY WITH SUBACROMIAL DECOMPRESSION AND BICEP TENDON REPAIR Right 02/24/2022   Procedure: SHOULDER ARTHROSCOPY WITH SUBACROMIAL DECOMPRESSION AND BICEP TENDON REPAIR;  Surgeon: Hiram Gash, MD;  Location: [facility];  Service: Orthopedics;  Laterality: Right;   SVT ABLATION N/A 11/16/2018   Procedure: SVT ABLATION;  Surgeon: Evans Lance, MD;  Location: [facility];  Service: Cardiovascular;  Laterality: N/A;   TUBAL LIGATION     VAGINAL HYSTERECTOMY N/A 10/17/2018   Procedure: HYSTERECTOMY VAGINAL;  Surgeon: Florian Buff, MD;  Location: [facility];  Service: Gynecology;  Laterality: N/A;   WRIST SURGERY Right      Family History  Problem Relation Age of Onset   Hypertension Mother    Heart disease Mother    COPD Father    Hypertension Maternal Aunt    Diabetes Maternal Grandmother    Kidney disease Sister    Colon cancer Neg Hx    Esophageal cancer Neg Hx    Rectal cancer Neg Hx    Stomach cancer Neg Hx      Social History   Socioeconomic History   Marital status: Single    Spouse name: Not  on file   Number of children: 4   Years of education: Not on file   Highest education level: Not on file  Occupational History   Not on file  Tobacco Use   Smoking status: Former    Packs/day: 1.00    Years: 33.00    Total pack years: 33.00    Types: Cigarettes    Quit date: 04/02/2008    Years since quitting: 14.0   Smokeless tobacco: Never  Vaping Use   Vaping Use: Former  Substance and Sexual Activity   Alcohol use: No   Drug use: No   Sexual activity: Not Currently    Birth control/protection: Surgical  Other Topics Concern   Not on file  Social History Narrative   Not on file   Social Determinants of Health   Financial Resource Strain: Not on file  Food Insecurity: Not on file  Transportation  Needs: Not on file  Physical Activity: Not on file  Stress: Not on file  Social Connections: Not on file  Intimate Partner Violence: Not on file     BP 136/74   Pulse (!) 52   Ht 5' 6.5" (1.689 m)   Wt 299 lb (135.6 kg)   LMP 07/05/2017 (Approximate)   SpO2 99%   BMI 47.54 kg/m   Physical Exam:  Morbidly obese appearing NAD HEENT: Unremarkable Neck:  No JVD, no thyromegally Lymphatics:  No adenopathy Back:  No CVA tenderness Lungs:  Clear with no wheezes HEART:  Regular rate rhythm, no murmurs, no rubs, no clicks Abd:  soft, positive bowel sounds, no organomegally, no rebound, no guarding Ext:  2 plus pulses, no edema, no cyanosis, no clubbing Skin:  No rashes no nodules Neuro:  CN II through XII intact, motor grossly intact   Assess/Plan: Palpitations - I have discussed the benign nature and asker her to continue the toprol, limit caffeine and ETOH and work on her sleep hygiene Sleep apnea - she has had trouble with her CPAP. I encouraged her to see Dr. Radford Pax back. Obesity - her weight is up 20 lbs since her last visit. We discussed meds for weight loss, diet and exercise and I encouraged her to reach out to her primary MD SVT - she has not had any symptoms since I saw her last.  Donna Long

## 2022-04-19 NOTE — Patient Instructions (Signed)
Medication Instructions:  Your physician recommends that you continue on your current medications as directed. Please refer to the Current Medication list given to you today.  *If you need a refill on your cardiac medications before your next appointment, please call your pharmacy*   Lab Work: NONE   If you have labs (blood work) drawn today and your tests are completely normal, you will receive your results only by: MyChart Message (if you have MyChart) OR A paper copy in the mail If you have any lab test that is abnormal or we need to change your treatment, we will call you to review the results.   Testing/Procedures: NONE    Follow-Up: At [facility], you and your health needs are our priority.  As part of our continuing mission to provide you with exceptional heart care, we have created designated Provider Care Teams.  These Care Teams include your primary Cardiologist (physician) and Advanced Practice Providers (APPs -  Physician Assistants and Nurse Practitioners) who all work together to provide you with the care you need, when you need it.  We recommend signing up for the patient portal called "MyChart".  Sign up information is provided on this After Visit Summary.  MyChart is used to connect with patients for Virtual Visits (Telemedicine).  Patients are able to view lab/test results, encounter notes, upcoming appointments, etc.  Non-urgent messages can be sent to your provider as well.   To learn more about what you can do with MyChart, go to [email or web address].    Your next appointment:   1 year(s)  Provider:   Gregg Taylor, MD    Other Instructions Thank you for choosing [facility]!    

## 2022-04-19 NOTE — Telephone Encounter (Signed)
What problem are you experiencing? Patient states that she has not had new supplies in 3 years and wants to know if she needs to wait until June for her appt with Dr. Radford Pax to get those new supplies. I advised her that since it had been 3 years since her sleep study and no follow up in the office then I was pretty certain that she needed to see Dr. Radford Pax. She wanted me to send a message just in case there is something that can be done.   Who is your medical equipment company? Unsure of DME   Please route to the sleep study assistant.

## 2022-04-20 NOTE — Telephone Encounter (Addendum)
Spoke to [employer] at [employer] [facility] and he says the patient will need an office visit, a compliance report and a new order for new cpap and supplies. [employer] new [facility]ME. Patient will bring her machine for a compliance download.

## 2022-04-21 NOTE — Telephone Encounter (Signed)
Patient brought her cpap machine and supplies off line. She has brought her machine in for a download today. She needs new supplies it has been 3 years she understands she needs an office visit with Dr Radford Pax. Download was obtained today. She is a new patient but she never had a sleep follow up visit after getting her cpap.

## 2022-04-27 DIAGNOSIS — R21 Rash and other nonspecific skin eruption: Secondary | ICD-10-CM | POA: Diagnosis not present

## 2022-05-18 DIAGNOSIS — J329 Chronic sinusitis, unspecified: Secondary | ICD-10-CM | POA: Diagnosis not present

## 2022-05-26 DIAGNOSIS — Z6841 Body Mass Index (BMI) 40.0 and over, adult: Secondary | ICD-10-CM | POA: Diagnosis not present

## 2022-05-26 DIAGNOSIS — R030 Elevated blood-pressure reading, without diagnosis of hypertension: Secondary | ICD-10-CM | POA: Diagnosis not present

## 2022-05-26 DIAGNOSIS — M25532 Pain in left wrist: Secondary | ICD-10-CM | POA: Diagnosis not present

## 2022-06-15 DIAGNOSIS — Z6841 Body Mass Index (BMI) 40.0 and over, adult: Secondary | ICD-10-CM | POA: Diagnosis not present

## 2022-06-15 DIAGNOSIS — H9202 Otalgia, left ear: Secondary | ICD-10-CM | POA: Diagnosis not present

## 2022-06-15 DIAGNOSIS — J019 Acute sinusitis, unspecified: Secondary | ICD-10-CM | POA: Diagnosis not present

## 2022-06-15 DIAGNOSIS — J309 Allergic rhinitis, unspecified: Secondary | ICD-10-CM | POA: Diagnosis not present

## 2022-06-15 DIAGNOSIS — R030 Elevated blood-pressure reading, without diagnosis of hypertension: Secondary | ICD-10-CM | POA: Diagnosis not present

## 2022-06-16 ENCOUNTER — Telehealth: Payer: Self-pay | Admitting: Internal Medicine

## 2022-06-16 DIAGNOSIS — R002 Palpitations: Secondary | ICD-10-CM

## 2022-06-16 DIAGNOSIS — I471 Supraventricular tachycardia, unspecified: Secondary | ICD-10-CM

## 2022-06-16 NOTE — Telephone Encounter (Signed)
Pt c/o medication issue:  1. Name of Medication: metoprolol succinate (TOPROL-XL) 50 MG 24 hr tablet   2. How are you currently taking this medication (dosage and times per day)?   Take 1 tablet (50 mg total) by mouth daily. Take with or immediately following a meal.    3. Are you having a reaction (difficulty breathing--STAT)? No  4. What is your medication issue? Pt is requesting a callback due to her needing a new prescription sent to pharmacy because instructions say she has to take 1 tablet by mouth daily but she stated some days she has to take more than one. Now, she's out of medication and they wont refill it because its too soon. Please advise

## 2022-06-16 NOTE — Telephone Encounter (Signed)
Patient states she is taking extra Toprol frequently and has run out. Pharmacy states she filled 90 day supply on 04/09/22 and should be good thru 07/10/22. The pharmacist was concerned she was taking extra metoprolol succinate as that is not meant to be taken that way. Please advise.   The pharmacy can  sell her 21 day supply for $14.39

## 2022-06-17 MED ORDER — METOPROLOL SUCCINATE ER 50 MG PO TB24: 50.0000 mg | ORAL_TABLET | Freq: Every day | ORAL | 3 refills | Status: DC

## 2022-06-17 NOTE — Telephone Encounter (Signed)
Message received from [facility], taken to Dr. Ladona Ridgel in clinic on 5/9.    New orders per Dr. Ladona Ridgel:  Please change prescription to:  Toprol XL 50 mg, Take one tablet daily; May take one extra tablet, only as needed.  Refill 120 tablets, 3 refills.    Pt called, and appreciated the refill. Refill sent to [facility] in [place].    Pt stated the Toprol XL is working for her, but the reason she was taking extra on was b/c her BP has been elevated.  150's 180's systolic?  Pt advised to start monitoring this, and write down her BP and HR in a note book.  Pt advised I will make Dr. Ladona Ridgel aware of what is happening, but more data is required.  Pt advised with BP and HR data, a new medication intervention may be advised?  Pt verbalized understanding to this plan of care.  Pt also advised to contact [facility] for any new or worsening symptoms, and a provider's office visit can be scheduled.  Pt understood, and will forward message to Dr. Ladona Ridgel to make him aware.

## 2022-06-26 NOTE — Telephone Encounter (Signed)
Agree with above. GT 

## 2022-07-14 ENCOUNTER — Encounter: Payer: Self-pay | Admitting: Cardiology

## 2022-07-14 ENCOUNTER — Ambulatory Visit: Payer: Medicaid Other | Attending: Cardiology | Admitting: Cardiology

## 2022-07-14 ENCOUNTER — Telehealth: Payer: Self-pay | Admitting: *Deleted

## 2022-07-14 VITALS — BP 132/81 | HR 63 | Wt 251.2 lb

## 2022-07-14 DIAGNOSIS — I10 Essential (primary) hypertension: Secondary | ICD-10-CM

## 2022-07-14 DIAGNOSIS — G4733 Obstructive sleep apnea (adult) (pediatric): Secondary | ICD-10-CM | POA: Diagnosis not present

## 2022-07-14 NOTE — Patient Instructions (Signed)
Medication Instructions:  Your physician recommends that you continue on your current medications as directed. Please refer to the Current Medication list given to you today.  *If you need a refill on your cardiac medications before your next appointment, please call your pharmacy*   Lab Work: None.  If you have labs (blood work) drawn today and your tests are completely normal, you will receive your results only by: MyChart Message (if you have MyChart) OR A paper copy in the mail If you have any lab test that is abnormal or we need to change your treatment, we will call you to review the results.   Testing/Procedures: None.   Follow-Up: At [facility], you and your health needs are our priority.  As part of our continuing mission to provide you with exceptional heart care, we have created designated Provider Care Teams.  These Care Teams include your primary Cardiologist (physician) and Advanced Practice Providers (APPs -  Physician Assistants and Nurse Practitioners) who all work together to provide you with the care you need, when you need it.  We recommend signing up for the patient portal called "MyChart".  Sign up information is provided on this After Visit Summary.  MyChart is used to connect with patients for Virtual Visits (Telemedicine).  Patients are able to view lab/test results, encounter notes, upcoming appointments, etc.  Non-urgent messages can be sent to your provider as well.   To learn more about what you can do with MyChart, go to [email or web address].    Your next appointment will be dependent on delivery of your cpap equipement and it will be with:    Provider:   Dr. Armanda Magic, MD   Other Instructions Dr. Mayford Knife has ordered new cpap equipment for you, someone from our sleep team or from your DME company will call you regarding delivery.

## 2022-07-14 NOTE — Progress Notes (Signed)
Sleep Medicine CONSULT Note    Date:  07/14/2022   ID:  [name], DOB 09-29-1970, MRN [id number]  PCP:  Patient, No Pcp Per  Cardiologist: Lewayne Bunting, MD   Chief Complaint  Patient presents with   New Patient (Initial Visit)    OSA    History of Present Illness:  Donna Long is a 52 y.o. female who is being seen today for the evaluation of OSA at the request of Lewayne Bunting, MD.  This is a 52 year old female with a history of anxiety, depression, SVT, GERD, hypertension.  She had a sleep study ordered by Dr. Ladona Ridgel for excessive daytime sleepiness which showed mild OSA with AHI of 8.3/hr and underwent CPAP titration to 6cm H2O.  She never followed up because she says that she was doing fine with her PAP and did not think that she needed to be seen.  She was recently seen by Dr. Ladona Ridgel and stated that she was having trouble with her CPAP.  She was referred for sleep apnea consultation to evaluate ongoing treatment of her sleep apnea.  She tells me when she started CPAP it took her a while to get used to her PAP but now she is having a lot of issues.  She says that the mask does not fit but has not had any new supplies since she started in 2020. She uses the nasal pillow mask which she tolerates well but no longer fits.  She needs all new equipment including a new device.  She bought her own device on line in 2020. She says that she is definitely having more OSA.  She has severe excessive daytime sleepiness and cannot stay up during the day and sleeps terribly.  She wakes up with headaches during sleep.  Past Medical History:  Diagnosis Date   Anemia    d/t gynecological issues   Anxiety    Arthritis    Back injury    Blood transfusion without reported diagnosis    Chronic back pain    Depression    Dysrhythmia    HX SVT   Fatty liver    GERD (gastroesophageal reflux disease)    Hypertension    Sleep apnea    SVT (supraventricular tachycardia)     Past Surgical History:   Procedure Laterality Date   DILITATION & CURRETTAGE/HYSTROSCOPY WITH NOVASURE ABLATION N/A 08/16/2017   Procedure: DILATATION & CURETTAGE/HYSTEROSCOPY WITH MINERVA  ENDOMETRIAL ABLATION;  Surgeon: Lazaro Arms, MD;  Location: [facility];  Service: Gynecology;  Laterality: N/A;   SHOULDER ARTHROSCOPY WITH DISTAL CLAVICLE RESECTION Right 02/24/2022   Procedure: SHOULDER ARTHROSCOPY WITH DISTAL CLAVICLE EXCISION;  Surgeon: Bjorn Pippin, MD;  Location: [facility];  Service: Orthopedics;  Laterality: Right;   SHOULDER ARTHROSCOPY WITH SUBACROMIAL DECOMPRESSION AND BICEP TENDON REPAIR Right 02/24/2022   Procedure: SHOULDER ARTHROSCOPY WITH SUBACROMIAL DECOMPRESSION AND BICEP TENDON REPAIR;  Surgeon: Bjorn Pippin, MD;  Location: [facility];  Service: Orthopedics;  Laterality: Right;   SVT ABLATION N/A 11/16/2018   Procedure: SVT ABLATION;  Surgeon: Marinus Maw, MD;  Location: [facility] INVASIVE [facility] LAB;  Service: Cardiovascular;  Laterality: N/A;   TUBAL LIGATION     VAGINAL HYSTERECTOMY N/A 10/17/2018   Procedure: HYSTERECTOMY VAGINAL;  Surgeon: Lazaro Arms, MD;  Location: [facility];  Service: Gynecology;  Laterality: N/A;   WRIST SURGERY Right     Current Medications: Current Meds  Medication Sig   Acetaminophen Extra Strength 500  MG CAPS Take 2 capsules by mouth every 8 (eight) hours.   aspirin 81 MG chewable tablet Chew by mouth.   esomeprazole (NEXIUM) 20 MG capsule Take 20 mg by mouth daily at 12 noon.   gabapentin (NEURONTIN) 100 MG capsule Take 100 mg by mouth 2 (two) times daily.   Lysine 500 MG CAPS Take by mouth.   metoprolol succinate (TOPROL-XL) 50 MG 24 hr tablet Take 1 tablet (50 mg total) by mouth daily. May take an extra tablet, ONLY AS NEEDED; Take with or immediately following a meal.    Allergies:   Kenalog  [triamcinolone] and Clindamycin/lincomycin   Social History   Socioeconomic History   Marital status: Single    Spouse name: Not on file    Number of children: 4   Years of education: Not on file   Highest education level: Not on file  Occupational History   Not on file  Tobacco Use   Smoking status: Former    Packs/day: 1.00    Years: 33.00    Additional pack years: 0.00    Total pack years: 33.00    Types: Cigarettes    Quit date: 04/02/2008    Years since quitting: 14.2   Smokeless tobacco: Never  Vaping Use   Vaping Use: Former  Substance and Sexual Activity   Alcohol use: No   Drug use: No   Sexual activity: Not Currently    Birth control/protection: Surgical  Other Topics Concern   Not on file  Social History Narrative   Not on file   Social Determinants of Health   Financial Resource Strain: Not on file  Food Insecurity: Not on file  Transportation Needs: Not on file  Physical Activity: Not on file  Stress: Not on file  Social Connections: Not on file     Family History:  The patient's family history includes COPD in her father; Diabetes in her maternal grandmother; Heart disease in her mother; Hypertension in her maternal aunt and mother; Kidney disease in her sister.   ROS:   Please see the history of present illness.    ROS All other systems reviewed and are negative.     11/18/2016   10:03 AM  PAD Screen  Previous PAD dx? No  Previous surgical procedure? No  Pain with walking? Yes  Subsides with rest? Yes  Feet/toe relief with dangling? Yes  Painful, non-healing ulcers? No  Extremities discolored? No       PHYSICAL EXAM:   VS:  BP 132/81   Pulse 63   Wt 251 lb 3.2 oz (113.9 kg)   LMP 07/05/2017 (Approximate)   SpO2 100%   BMI 39.94 kg/m    GEN: Well nourished, well developed, in no acute distress  HEENT: normal  Neck: no JVD, carotid bruits, or masses Cardiac: RRR; no murmurs, rubs, or gallops,no edema.  Intact distal pulses bilaterally.  Respiratory:  clear to auscultation bilaterally, normal work of breathing GI: soft, nontender, nondistended, + BS MS: no deformity or  atrophy  Skin: warm and dry, no rash Neuro:  Alert and Oriented x 3, Strength and sensation are intact Psych: euthymic mood, full affect  Wt Readings from Last 3 Encounters:  07/14/22 251 lb 3.2 oz (113.9 kg)  04/19/22 299 lb (135.6 kg)  02/24/22 (!) 304 lb 3.8 oz (138 kg)      Studies/Labs Reviewed:   none  Recent Labs: No results found for requested labs within last 365 days.   ASSESSMENT:  1. OSA on CPAP   2. Essential hypertension      PLAN:  In order of problems listed above:  OSA -her last download showed an AHI of 3/hr on 6cm H2O with good compliance -she started on CPAP in 2020 but has never gotten any new supplies.  She bought her own device on line and not through a DME.  Her mask no longer fits and she is have severe daytime sleepiness and poor sleep at night because the mask does not fit and she is having apneas at night.  She is awakening gasping for breath and also with severe HAs.  -I will order a new ResMed CPAP at 6cm H2O with heated humidity , mask of choice and supplies -She will see me back 6-8 weeks after getting her new device  2.  Hypertension -Continue prescription drug management with Toprol-XL 50 mg daily with as needed refills   Medication Adjustments/Labs and Tests Ordered: Current medicines are reviewed at length with the patient today.  Concerns regarding medicines are outlined above.  Medication changes, Labs and Tests ordered today are listed in the Patient Instructions below.  There are no Patient Instructions on file for this visit.   Signed, Armanda Magic, MD  07/14/2022 1:01 PM    [facility] [address] [place], [place], [place]  [address] Phone: [phone]; Fax: [phone]

## 2022-07-14 NOTE — Telephone Encounter (Signed)
new supplies and cushions for her mask as well as tubing.     new CPAP at 6cm H2O with heated humidity and mask of choice. then follow up with me in 6 weeks    Upon patient request DME selection is [facility]. Patient understands he will be contacted by [facility] to set up his cpap. Patient understands to call if [facility] does not contact him with new setup in a timely manner. Patient understands they will be called once confirmation has been received from [facility]/ that they have received their new machine to schedule 10 week follow up appointment.   [facility] notified of new cpap order  Please add to airview Patient was grateful for the call and thanked me.

## 2022-07-14 NOTE — Telephone Encounter (Signed)
Please get her in with a new DME ASAP and needs new supplies and cushions for her mask as well as tubing. She has not had any supplies since she bought her device online in 2020. She also wants a new CPAP at 6cm H2O with heated humidity and mask of choice. then followup with me in 6 weeks

## 2022-07-16 DIAGNOSIS — R030 Elevated blood-pressure reading, without diagnosis of hypertension: Secondary | ICD-10-CM | POA: Diagnosis not present

## 2022-07-16 DIAGNOSIS — Z6841 Body Mass Index (BMI) 40.0 and over, adult: Secondary | ICD-10-CM | POA: Diagnosis not present

## 2022-07-16 DIAGNOSIS — U071 COVID-19: Secondary | ICD-10-CM | POA: Diagnosis not present

## 2022-07-26 DIAGNOSIS — G4733 Obstructive sleep apnea (adult) (pediatric): Secondary | ICD-10-CM | POA: Diagnosis not present

## 2022-08-16 ENCOUNTER — Ambulatory Visit: Payer: Medicaid Other | Admitting: Nurse Practitioner

## 2022-08-16 ENCOUNTER — Encounter: Payer: Self-pay | Admitting: Nurse Practitioner

## 2022-08-16 VITALS — BP 120/80 | HR 79 | Temp 97.8°F | Ht 66.5 in | Wt 308.8 lb

## 2022-08-16 DIAGNOSIS — Z7689 Persons encountering health services in other specified circumstances: Secondary | ICD-10-CM

## 2022-08-16 NOTE — Progress Notes (Signed)
New Patient Office Visit  Subjective    Patient ID: Donna Long, female    DOB: 1970-07-20  Age: 52 y.o. MRN: [id number]  CC:  Chief Complaint  Patient presents with   Establish Care    HPI Para Asti presents to establish care   Patient left the room because she was upset about the CMA giving reports to the provider " I can here telling you everything out there, I work in the medical field will never done". No name was mentioned at the nursing station> She left without being seen.  Outpatient Encounter Medications as of 08/16/2022  Medication Sig   Acetaminophen Extra Strength 500 MG CAPS Take 2 capsules by mouth every 8 (eight) hours.   esomeprazole (NEXIUM) 20 MG capsule Take 20 mg by mouth daily at 12 noon.   gabapentin (NEURONTIN) 100 MG capsule Take 100 mg by mouth 2 (two) times daily.   ibuprofen (ADVIL) 800 MG tablet Take 800 mg by mouth every 6 (six) hours as needed.   metoprolol succinate (TOPROL-XL) 50 MG 24 hr tablet Take 1 tablet (50 mg total) by mouth daily. May take an extra tablet, ONLY AS NEEDED; Take with or immediately following a meal.   aspirin 81 MG chewable tablet Chew by mouth. (Patient not taking: Reported on 08/16/2022)   Lysine 500 MG CAPS Take by mouth. (Patient not taking: Reported on 08/16/2022)   No facility-administered encounter medications on file as of 08/16/2022.    Past Medical History:  Diagnosis Date   Anemia    d/t gynecological issues   Anxiety    Arthritis    Back injury    Blood transfusion without reported diagnosis    Chronic back pain    Depression    Dysrhythmia    HX SVT   Fatty liver    GERD (gastroesophageal reflux disease)    Hypertension    Sleep apnea    SVT (supraventricular tachycardia)     Past Surgical History:  Procedure Laterality Date   DILITATION & CURRETTAGE/HYSTROSCOPY WITH NOVASURE ABLATION N/A 08/16/2017   Procedure: DILATATION & CURETTAGE/HYSTEROSCOPY WITH MINERVA  ENDOMETRIAL ABLATION;  Surgeon: Lazaro Arms, MD;  Location: [facility];  Service: Gynecology;  Laterality: N/A;   SHOULDER ARTHROSCOPY WITH DISTAL CLAVICLE RESECTION Right 02/24/2022   Procedure: SHOULDER ARTHROSCOPY WITH DISTAL CLAVICLE EXCISION;  Surgeon: Bjorn Pippin, MD;  Location: [facility];  Service: Orthopedics;  Laterality: Right;   SHOULDER ARTHROSCOPY WITH SUBACROMIAL DECOMPRESSION AND BICEP TENDON REPAIR Right 02/24/2022   Procedure: SHOULDER ARTHROSCOPY WITH SUBACROMIAL DECOMPRESSION AND BICEP TENDON REPAIR;  Surgeon: Bjorn Pippin, MD;  Location: [facility];  Service: Orthopedics;  Laterality: Right;   SVT ABLATION N/A 11/16/2018   Procedure: SVT ABLATION;  Surgeon: Marinus Maw, MD;  Location: [facility] INV[facility] CV [facility];  Service: Cardiovascular;  Laterality: N/A;   TUBAL LIGATION     VAGINAL HYSTERECTOMY N/A 10/17/2018   Procedure: HYSTERECTOMY VAGINAL;  Surgeon: Lazaro Arms, MD;  Location: [facility];  Service: Gynecology;  Laterality: N/A;   WRIST SURGERY Right     Family History  Problem Relation Age of Onset   Hypertension Mother    Heart disease Mother    COPD Father    Hypertension Maternal Aunt    Diabetes Maternal Grandmother    Kidney disease Sister    Colon cancer Neg Hx    Esophageal cancer Neg Hx    Rectal cancer Neg Hx    Stomach  cancer Neg Hx     Social History   Socioeconomic History   Marital status: Single    Spouse name: Not on file   Number of children: 4   Years of education: Not on file   Highest education level: Not on file  Occupational History   Not on file  Tobacco Use   Smoking status: Former    Packs/day: 1.00    Years: 33.00    Additional pack years: 0.00    Total pack years: 33.00    Types: Cigarettes    Quit date: 04/02/2008    Years since quitting: 14.3   Smokeless tobacco: Never  Vaping Use   Vaping Use: Former  Substance and Sexual Activity   Alcohol use: No   Drug use: No   Sexual activity: Not Currently    Birth control/protection:  Surgical  Other Topics Concern   Not on file  Social History Narrative   Not on file   Social Determinants of Health   Financial Resource Strain: Not on file  Food Insecurity: Not on file  Transportation Needs: Not on file  Physical Activity: Not on file  Stress: Not on file  Social Connections: Not on file  Intimate Partner Violence: Not on file    ROS Negative unless indicated in HPI   Objective    BP 120/80   Pulse 79   Temp 97.8 F (36.6 C) (Temporal)   Ht 5' 6.5" (1.689 m)   Wt (!) 308 lb 12.8 oz (140.1 kg)   LMP 07/05/2017 (Approximate)   SpO2 97%   BMI 49.09 kg/m   Physical Exam  Last CBC Lab Results  Component Value Date   WBC 7.0 10/17/2020   HGB 13.9 10/17/2020   HCT 43.7 10/17/2020   MCV 91.2 10/17/2020   MCH 29.0 10/17/2020   RDW 14.9 10/17/2020   PLT 219 10/17/2020   Last metabolic panel Lab Results  Component Value Date   GLUCOSE 119 (H) 10/17/2020   NA 136 10/17/2020   K 4.5 10/17/2020   CL 107 10/17/2020   CO2 25 10/17/2020   BUN 13 10/17/2020   CREATININE 0.68 10/17/2020   GFRNONAA >60 10/17/2020   CALCIUM 8.4 (L) 10/17/2020   PROT 7.3 11/18/2018   ALBUMIN 3.6 11/18/2018   BILITOT 0.4 11/18/2018   ALKPHOS 99 11/18/2018   AST 56 (H) 11/18/2018   ALT 63 (H) 11/18/2018   ANIONGAP 4 (L) 10/17/2020   Last lipids Lab Results  Component Value Date   CHOL 146 06/30/2018   HDL 38 (L) 06/30/2018   LDLCALC 72 06/30/2018   TRIG 181 (H) 06/30/2018   CHOLHDL 3.8 06/30/2018        Assessment & Plan:  Encounter to establish care   She left without being seen   No follow-ups on file.   Arrie Aran Santa Lighter, DNP [facility] [address] [place], [place] [address] [phone]

## 2022-08-22 DIAGNOSIS — G4733 Obstructive sleep apnea (adult) (pediatric): Secondary | ICD-10-CM | POA: Diagnosis not present

## 2022-08-25 DIAGNOSIS — G4733 Obstructive sleep apnea (adult) (pediatric): Secondary | ICD-10-CM | POA: Diagnosis not present

## 2022-08-31 ENCOUNTER — Telehealth: Payer: Medicaid Other | Admitting: Cardiology

## 2022-09-12 ENCOUNTER — Ambulatory Visit: Payer: Medicaid Other | Attending: Cardiology | Admitting: Cardiology

## 2022-09-12 ENCOUNTER — Telehealth: Payer: Self-pay | Admitting: *Deleted

## 2022-09-12 DIAGNOSIS — G4733 Obstructive sleep apnea (adult) (pediatric): Secondary | ICD-10-CM | POA: Diagnosis not present

## 2022-09-12 DIAGNOSIS — I10 Essential (primary) hypertension: Secondary | ICD-10-CM | POA: Diagnosis not present

## 2022-09-12 NOTE — Telephone Encounter (Signed)
Per Dr Mayford Knife, order nasal cushion mask    Order placed to [facility] via community message.

## 2022-09-12 NOTE — Patient Instructions (Signed)
Medication Instructions:  Your physician recommends that you continue on your current medications as directed. Please refer to the Current Medication list given to you today.  *If you need a refill on your cardiac medications before your next appointment, please call your pharmacy*  Follow-Up: At [facility], you and your health needs are our priority.  As part of our continuing mission to provide you with exceptional heart care, we have created designated Provider Care Teams.  These Care Teams include your primary Cardiologist (physician) and Advanced Practice Providers (APPs -  Physician Assistants and Nurse Practitioners) who all work together to provide you with the care you need, when you need it.  Your next appointment:   1 year(s)  Provider:   Dr. Radford Pax

## 2022-09-12 NOTE — Progress Notes (Signed)
SLEEP MEDICINE VIRTUAL VISIT via Video Note   Because of Tara Divincenzo co-morbid illnesses, she is at least at moderate risk for complications without adequate follow up.  This format is felt to be most appropriate for this patient at this time.  All issues noted in this document were discussed and addressed.  A limited physical exam was performed with this format.  Please refer to the patient's chart for her consent to telehealth for [facility].       Date:  09/12/2022   ID:  Donna Long, DOB 1970-10-23, MRN [id number] The patient was identified using 2 identifiers.  Patient Location: Home Provider Location: Home Office   PCP:  Evern Bio, Dois Davenport, NP   [facility] [facility] Providers Cardiologist:  Lewayne Bunting, MD     Evaluation Performed:  Follow-Up Visit  Chief Complaint:  OSA  History of Present Illness:    Donna Long is a 52 y.o. female with a history of anxiety, depression, SVT, GERD, hypertension.  She had a sleep study ordered by Dr. Ladona Ridgel for excessive daytime sleepiness which showed mild OSA with AHI of 8.3/hr and underwent CPAP titration to 6cm H2O.  She never followed up because she was doing fine with her PAP and did not think that she needed to be seen.  She was seen back by Dr. Ladona Ridgel and was having trouble with her CPAP and was referred to sleep medicine.    At her last OV she told me that when she started CPAP it took her a while to get used to her PAP . She complained that the mask didn't fit but had not had any new supplies since she started in 2020. She also needed new equipment and wanted a new device at that time. She was complaining of severe daytime sleepiness and snoring as well.  I ordered a new ResMed CPAP at 6cm H2O and she is now here for 6 week followup.   She is doing well with her PAP device and thinks that she has gotten used to it.  She says that the mask she got this time is the same but it is very loose and is leaking at the  bridge of her nose but then when she tightens it it is too tight over her cheeks.  She feels the pressure is not enough.  Since going on PAP she feels rested in the am and has no significant daytime sleepiness.  She denies any significant mouth or nasal dryness or nasal congestion.    Past Medical History:  Diagnosis Date   Anemia    d/t gynecological issues   Anxiety    Arthritis    Back injury    Blood transfusion without reported diagnosis    Chronic back pain    Depression    Dysrhythmia    HX SVT   Fatty liver    GERD (gastroesophageal reflux disease)    Hypertension    Sleep apnea    SVT (supraventricular tachycardia)    Past Surgical History:  Procedure Laterality Date   DILITATION & CURRETTAGE/HYSTROSCOPY WITH NOVASURE ABLATION N/A 08/16/2017   Procedure: DILATATION & CURETTAGE/HYSTEROSCOPY WITH MINERVA  ENDOMETRIAL ABLATION;  Surgeon: Lazaro Arms, MD;  Location: [facility];  Service: Gynecology;  Laterality: N/A;   SHOULDER ARTHROSCOPY WITH DISTAL CLAVICLE RESECTION Right 02/24/2022   Procedure: SHOULDER ARTHROSCOPY WITH DISTAL CLAVICLE EXCISION;  Surgeon: Bjorn Pippin, MD;  Location: [facility];  Service: Orthopedics;  Laterality: Right;   SHOULDER ARTHROSCOPY WITH SUBACROMIAL DECOMPRESSION AND BICEP TENDON REPAIR Right 02/24/2022   Procedure: SHOULDER ARTHROSCOPY WITH SUBACROMIAL DECOMPRESSION AND BICEP TENDON REPAIR;  Surgeon: Bjorn Pippin, MD;  Location: [facility];  Service: Orthopedics;  Laterality: Right;   SVT ABLATION N/A 11/16/2018   Procedure: SVT ABLATION;  Surgeon: Marinus Maw, MD;  Location: [facility] INVASIVE CV LAB;  Service: Cardiovascular;  Laterality: N/A;   TUBAL LIGATION     VAGINAL HYSTERECTOMY N/A 10/17/2018   Procedure: HYSTERECTOMY VAGINAL;  Surgeon: Lazaro Arms, MD;  Location: [facility];  Service: Gynecology;  Laterality: N/A;   WRIST SURGERY Right      No outpatient medications have been marked as taking for the 09/12/22  encounter (Video Visit) with Quintella Reichert, MD.     Allergies:   Kenalog  [triamcinolone] and Clindamycin/lincomycin   Social History   Tobacco Use   Smoking status: Former    Current packs/day: 0.00    Average packs/day: 1 pack/day for 33.0 years (33.0 ttl pk-yrs)    Types: Cigarettes    Start date: 04/03/1975    Quit date: 04/02/2008    Years since quitting: 14.4   Smokeless tobacco: Never  Vaping Use   Vaping status: Former  Substance Use Topics   Alcohol use: No   Drug use: No     Family Hx: The patient's family history includes COPD in her father; Diabetes in her maternal grandmother; Heart disease in her mother; Hypertension in her maternal aunt and mother; Kidney disease in her sister. There is no history of Colon cancer, Esophageal cancer, Rectal cancer, or Stomach cancer.  ROS:   Please see the history of present illness.     All other systems reviewed and are negative.   Prior Sleep studies:   The following studies were reviewed today:  PAP compliance download  Labs/Other Tests and Data Reviewed:     Recent Labs: No results found for requested labs within last 365 days.   Wt Readings from Last 3 Encounters:  08/16/22 (!) 308 lb 12.8 oz (140.1 kg)  07/14/22 251 lb 3.2 oz (113.9 kg)  04/19/22 299 lb (135.6 kg)     Risk Assessment/Calculations:      Objective:    Vital Signs:  LMP 07/05/2017 (Approximate)    VITAL SIGNS:  reviewed GEN:  no acute distress EYES:  sclerae anicteric, EOMI - Extraocular Movements Intact RESPIRATORY:  normal respiratory effort, symmetric expansion CARDIOVASCULAR:  no peripheral edema SKIN:  no rash, lesions or ulcers. MUSCULOSKELETAL:  no obvious deformities. NEURO:  alert and oriented x 3, no obvious focal deficit PSYCH:  normal affect  ASSESSMENT & PLAN:    OSA - The patient is tolerating PAP therapy well without any problems. The PAP download performed by his DME was personally reviewed and interpreted by me  today and showed an AHI of 0.6/hr on 6 cm H2O with 97% compliance in using more than 4 hours nightly.  The patient has been using and benefiting from PAP use and will continue to benefit from therapy.  -she would like to try a nasal cushion mask which I will order  Hypertension -BP controlled at home -Continue prescription drug management with Toprol XL 50 mg daily with as needed refills    Total time of encounter: 20 minutes total time of encounter, including 15 minutes spent in face-to-face patient care on the date of this encounter. This time includes coordination of care and counseling regarding above  mentioned problem list. Remainder of non-face-to-face time involved reviewing chart documents/testing relevant to the patient encounter and documentation in the medical record. I have independently reviewed documentation from referring provider.    Medication Adjustments/Labs and Tests Ordered: Current medicines are reviewed at length with the patient today.  Concerns regarding medicines are outlined above.   Tests Ordered: No orders of the defined types were placed in this encounter.   Medication Changes: No orders of the defined types were placed in this encounter.   Follow Up:  In Person in 1 year(s)  Signed, Armanda Magic, MD  09/12/2022 9:11 AM

## 2022-09-16 DIAGNOSIS — Z6841 Body Mass Index (BMI) 40.0 and over, adult: Secondary | ICD-10-CM | POA: Diagnosis not present

## 2022-09-16 DIAGNOSIS — M7732 Calcaneal spur, left foot: Secondary | ICD-10-CM | POA: Diagnosis not present

## 2022-09-16 DIAGNOSIS — M79672 Pain in left foot: Secondary | ICD-10-CM | POA: Diagnosis not present

## 2022-09-16 DIAGNOSIS — R030 Elevated blood-pressure reading, without diagnosis of hypertension: Secondary | ICD-10-CM | POA: Diagnosis not present

## 2022-09-16 DIAGNOSIS — M722 Plantar fascial fibromatosis: Secondary | ICD-10-CM | POA: Diagnosis not present

## 2022-09-18 DIAGNOSIS — Z6841 Body Mass Index (BMI) 40.0 and over, adult: Secondary | ICD-10-CM | POA: Diagnosis not present

## 2022-09-18 DIAGNOSIS — R030 Elevated blood-pressure reading, without diagnosis of hypertension: Secondary | ICD-10-CM | POA: Diagnosis not present

## 2022-09-18 DIAGNOSIS — H05011 Cellulitis of right orbit: Secondary | ICD-10-CM | POA: Diagnosis not present

## 2022-09-23 ENCOUNTER — Encounter: Payer: Self-pay | Admitting: Cardiology

## 2022-10-04 ENCOUNTER — Telehealth: Payer: Self-pay | Admitting: Nurse Practitioner

## 2022-10-04 ENCOUNTER — Telehealth: Payer: Self-pay | Admitting: Cardiology

## 2022-10-04 NOTE — Telephone Encounter (Signed)
Pt calling back to f/u on nasal cushion for CPAP. Pt states that she has not heard anything nor received anything. She would like a callback regarding this matter. Please advise

## 2022-10-04 NOTE — Telephone Encounter (Signed)
Pt called requesting to make an appt to establish care with another provider. Is Donna Long ok with this?

## 2022-10-05 NOTE — Telephone Encounter (Signed)
Spoke with patient, notified patient that a message was sent to [facility] concerning her order for nasal pillows that was sent by Oneida Alar on 09/09/22. Patient will come by [facility] office today to pick a mask from sleep coordinator.

## 2022-10-05 NOTE — Telephone Encounter (Signed)
Pt called in stating she was supposed to come pick up a cpap from you, she wants to know if this can be mailed to her address on file instead.

## 2022-10-06 DIAGNOSIS — Z6841 Body Mass Index (BMI) 40.0 and over, adult: Secondary | ICD-10-CM | POA: Diagnosis not present

## 2022-10-06 DIAGNOSIS — R030 Elevated blood-pressure reading, without diagnosis of hypertension: Secondary | ICD-10-CM | POA: Diagnosis not present

## 2022-10-06 DIAGNOSIS — J069 Acute upper respiratory infection, unspecified: Secondary | ICD-10-CM | POA: Diagnosis not present

## 2022-10-12 NOTE — Telephone Encounter (Signed)
Patient called today following up on this request. I explained Provider had been out on PAL that could be causing the delay. Please look at this as soon as possible.

## 2022-11-21 ENCOUNTER — Encounter (HOSPITAL_COMMUNITY): Payer: Self-pay

## 2022-11-21 ENCOUNTER — Emergency Department (HOSPITAL_COMMUNITY)
Admission: EM | Admit: 2022-11-21 | Discharge: 2022-11-21 | Disposition: A | Discharge status: Home / Self Care | Payer: Medicaid Other | Attending: Emergency Medicine | Admitting: Emergency Medicine

## 2022-11-21 ENCOUNTER — Other Ambulatory Visit: Payer: Self-pay

## 2022-11-21 DIAGNOSIS — Z79899 Other long term (current) drug therapy: Secondary | ICD-10-CM | POA: Diagnosis not present

## 2022-11-21 DIAGNOSIS — Z7982 Long term (current) use of aspirin: Secondary | ICD-10-CM | POA: Insufficient documentation

## 2022-11-21 DIAGNOSIS — I10 Essential (primary) hypertension: Secondary | ICD-10-CM | POA: Diagnosis not present

## 2022-11-21 DIAGNOSIS — R519 Headache, unspecified: Secondary | ICD-10-CM | POA: Diagnosis present

## 2022-11-21 NOTE — ED Triage Notes (Signed)
Pt arrived from home via POV c/o hypertension. Pt reports recent headaches this week, so she checked her BP and it was 180'2/110's. Pt reports taking her BP medications as prescribed and took 50mg  of Metoprolol apprx 90 mins PTA. BP is 147/93 in Triage. Pt endorses her Headache is still present.

## 2022-11-21 NOTE — ED Provider Notes (Signed)
[place] EMERGENCY DEPARTMENT AT [facility] Provider Note   CSN: [id number] Arrival date & time: 11/21/22  4098     History  Chief Complaint  Patient presents with   Hypertension    Donna Long is a 52 y.o. female.   Hypertension Associated symptoms include headaches.  Patient presents for headache and hypertension.  Medical history includes anemia, anxiety, arthritis, depression, GERD, HTN, SVT.  She has had intermittent headaches this week.  When she checked her blood pressure, it was in the 180s SBP.  She takes metoprolol for hypertension.  She was also taking gabapentin for neuropathy.  Her dose is 100 mg 3 times daily.  She recently was reading about the possible side effects and discontinued use of this medication.  She developed a generalized headache shortly thereafter.  She did not take any Tylenol or ibuprofen today.  Current headache is mild.     Home Medications Prior to Admission medications   Medication Sig Start Date End Date Taking? Authorizing Provider  Acetaminophen Extra Strength 500 MG CAPS Take 2 capsules by mouth every 8 (eight) hours. 03/04/22   [provider]  aspirin 81 MG chewable tablet Chew by mouth. Patient not taking: Reported on 08/16/2022 10/31/18   [provider]  esomeprazole (NEXIUM) 20 MG capsule Take 20 mg by mouth daily at 12 noon.    [provider]  gabapentin (NEURONTIN) 100 MG capsule Take 100 mg by mouth 2 (two) times daily. 03/18/22   [provider]  ibuprofen (ADVIL) 800 MG tablet Take 800 mg by mouth every 6 (six) hours as needed. 07/01/22   [provider]  Lysine 500 MG CAPS Take 500 mg by mouth daily in the afternoon.    [provider]  metoprolol succinate (TOPROL-XL) 50 MG 24 hr tablet Take 1 tablet (50 mg total) by mouth daily. May take an extra tablet, ONLY AS NEEDED; Take with or immediately following a meal. 06/17/22   Marinus Maw, MD      Allergies     Kenalog  [triamcinolone] and Clindamycin/lincomycin    Review of Systems   Review of Systems  Neurological:  Positive for headaches.  All other systems reviewed and are negative.   Physical Exam Updated Vital Signs BP (!) 147/93 (BP Location: Left Arm)   Pulse 77   Temp 99 F (37.2 C) (Oral)   Resp 18   Ht 5' 6.5" (1.689 m)   Wt (!) 140 kg   LMP 07/05/2017 (Approximate)   SpO2 98%   BMI 49.07 kg/m  Physical Exam Vitals and nursing note reviewed.  Constitutional:      General: She is not in acute distress.    Appearance: Normal appearance. She is well-developed. She is not ill-appearing, toxic-appearing or diaphoretic.  HENT:     Head: Normocephalic and atraumatic.     Right Ear: External ear normal.     Left Ear: External ear normal.     Nose: Nose normal.     Mouth/Throat:     Mouth: Mucous membranes are moist.  Eyes:     Extraocular Movements: Extraocular movements intact.     Conjunctiva/sclera: Conjunctivae normal.  Cardiovascular:     Rate and Rhythm: Normal rate and regular rhythm.  Pulmonary:     Effort: Pulmonary effort is normal. No respiratory distress.  Abdominal:     General: There is no distension.     Palpations: Abdomen is soft.  Musculoskeletal:  General: No swelling. Normal range of motion.     Cervical back: Normal range of motion and neck supple.  Skin:    General: Skin is warm and dry.     Coloration: Skin is not jaundiced or pale.  Neurological:     General: No focal deficit present.     Mental Status: She is alert and oriented to person, place, and time.     Cranial Nerves: No cranial nerve deficit.     Sensory: No sensory deficit.     Motor: No weakness.     Coordination: Coordination normal.  Psychiatric:        Mood and Affect: Mood normal.        Behavior: Behavior normal.     ED Results / Procedures / Treatments   Labs (all labs ordered are listed, but only abnormal results are displayed) Labs Reviewed - No data to  display  EKG None  Radiology No results found.  Procedures Procedures    Medications Ordered in ED Medications - No data to display  ED Course/ Medical Decision Making/ A&P                                 Medical Decision Making  Patient presents for intermittent headaches over the past week.  This was in the setting of discontinuing her gabapentin.  She is prescribed metoprolol twice daily.  She has been taking it once a day.  She checked her blood pressure earlier today at home and it was elevated in the range of 180 SBP.  Vital signs on arrival show improved blood pressure in the 140s SBP.  At time of being bedded in the ED, blood pressure had improved to 120s over 70s.  Patient has an ongoing mild headache.  She is well-appearing.  She has no focal neurologic deficits.  She was offered analgesia here in the ED.  Patient states that she prefers to take OTC medication at home.  I discussed the gabapentin with her.  She states that she was on it and not experiencing any unwanted side effects.  She states that it was helping her neuropathy.  She was advised that it would be safe to continue taking.  Given that her headache may be secondary to gabapentin withdrawal, she was advised to taper it off if she did want to discontinue.  Patient has PCP follow-up appointment scheduled this week.  She declines any treatment or workup here in the ED.  She was discharged in good condition.        Final Clinical Impression(s) / ED Diagnoses Final diagnoses:  Acute nonintractable headache, unspecified headache type    Rx / DC Orders ED Discharge Orders     None         Gloris Manchester, MD 11/21/22 [phone]

## 2022-11-21 NOTE — ED Notes (Signed)
Pt reports she stopped "cold-turkey" taking her Gabapentin.

## 2022-11-21 NOTE — Discharge Instructions (Signed)
Resume taking your gabapentin.  If you do wish to discontinue this medication, you should taper it off over the course of a week.  Take ibuprofen and Tylenol as needed for headache.  Return to the emergency department at anytime for any new or worsening symptoms of concern.

## 2023-02-04 ENCOUNTER — Other Ambulatory Visit: Payer: Self-pay | Admitting: Physician Assistant

## 2023-02-04 DIAGNOSIS — R002 Palpitations: Secondary | ICD-10-CM

## 2023-02-04 DIAGNOSIS — I471 Supraventricular tachycardia, unspecified: Secondary | ICD-10-CM

## 2023-03-23 ENCOUNTER — Telehealth: Payer: Self-pay | Admitting: *Deleted

## 2023-03-23 ENCOUNTER — Emergency Department (HOSPITAL_COMMUNITY): Payer: Medicaid Other

## 2023-03-23 ENCOUNTER — Emergency Department (HOSPITAL_COMMUNITY)
Admission: EM | Admit: 2023-03-23 | Discharge: 2023-03-23 | Disposition: A | Discharge status: Home / Self Care | Payer: Medicaid Other | Attending: Student | Admitting: Student

## 2023-03-23 ENCOUNTER — Telehealth: Payer: Self-pay | Admitting: Internal Medicine

## 2023-03-23 ENCOUNTER — Other Ambulatory Visit: Payer: Self-pay

## 2023-03-23 ENCOUNTER — Other Ambulatory Visit: Payer: Medicaid Other

## 2023-03-23 ENCOUNTER — Encounter (HOSPITAL_COMMUNITY): Payer: Self-pay

## 2023-03-23 DIAGNOSIS — R55 Syncope and collapse: Secondary | ICD-10-CM

## 2023-03-23 DIAGNOSIS — U071 COVID-19: Secondary | ICD-10-CM | POA: Insufficient documentation

## 2023-03-23 DIAGNOSIS — I10 Essential (primary) hypertension: Secondary | ICD-10-CM | POA: Insufficient documentation

## 2023-03-23 DIAGNOSIS — Z87891 Personal history of nicotine dependence: Secondary | ICD-10-CM | POA: Insufficient documentation

## 2023-03-23 DIAGNOSIS — Z79899 Other long term (current) drug therapy: Secondary | ICD-10-CM | POA: Diagnosis not present

## 2023-03-23 DIAGNOSIS — Z7982 Long term (current) use of aspirin: Secondary | ICD-10-CM | POA: Diagnosis not present

## 2023-03-23 DIAGNOSIS — R059 Cough, unspecified: Secondary | ICD-10-CM | POA: Diagnosis present

## 2023-03-23 LAB — CBC WITH DIFFERENTIAL/PLATELET
Abs Immature Granulocytes: 0.02 10*3/uL (ref 0.00–0.07)
Basophils Absolute: 0.1 10*3/uL (ref 0.0–0.1)
Basophils Relative: 1 %
Eosinophils Absolute: 0.1 10*3/uL (ref 0.0–0.5)
Eosinophils Relative: 1 %
HCT: 47.8 % — ABNORMAL HIGH (ref 36.0–46.0)
Hemoglobin: 14.9 g/dL (ref 12.0–15.0)
Immature Granulocytes: 0 %
Lymphocytes Relative: 28 %
Lymphs Abs: 2.4 10*3/uL (ref 0.7–4.0)
MCH: 28.1 pg (ref 26.0–34.0)
MCHC: 31.2 g/dL (ref 30.0–36.0)
MCV: 90.2 fL (ref 80.0–100.0)
Monocytes Absolute: 0.8 10*3/uL (ref 0.1–1.0)
Monocytes Relative: 10 %
Neutro Abs: 5.3 10*3/uL (ref 1.7–7.7)
Neutrophils Relative %: 60 %
Platelets: 261 10*3/uL (ref 150–400)
RBC: 5.3 MIL/uL — ABNORMAL HIGH (ref 3.87–5.11)
RDW: 14.7 % (ref 11.5–15.5)
WBC: 8.7 10*3/uL (ref 4.0–10.5)
nRBC: 0 % (ref 0.0–0.2)

## 2023-03-23 LAB — COMPREHENSIVE METABOLIC PANEL
ALT: 53 U/L — ABNORMAL HIGH (ref 0–44)
AST: 37 U/L (ref 15–41)
Albumin: 3.7 g/dL (ref 3.5–5.0)
Alkaline Phosphatase: 97 U/L (ref 38–126)
Anion gap: 10 (ref 5–15)
BUN: 7 mg/dL (ref 6–20)
CO2: 26 mmol/L (ref 22–32)
Calcium: 9.2 mg/dL (ref 8.9–10.3)
Chloride: 102 mmol/L (ref 98–111)
Creatinine, Ser: 0.7 mg/dL (ref 0.44–1.00)
GFR, Estimated: >60 mL/min (ref >60)
Glucose, Bld: 101 mg/dL — ABNORMAL HIGH (ref 70–99)
Potassium: 4 mmol/L (ref 3.5–5.1)
Sodium: 138 mmol/L (ref 135–145)
Total Bilirubin: 0.5 mg/dL (ref 0.0–1.2)
Total Protein: 7.6 g/dL (ref 6.5–8.1)

## 2023-03-23 LAB — RESP PANEL BY RT-PCR (RSV, FLU A&B, COVID)  RVPGX2
Influenza A by PCR: NEGATIVE
Influenza B by PCR: NEGATIVE
Resp Syncytial Virus by PCR: NEGATIVE
SARS Coronavirus 2 by RT PCR: POSITIVE — AB

## 2023-03-23 LAB — TROPONIN I (HIGH SENSITIVITY): Troponin I (High Sensitivity): 2 ng/L (ref <18)

## 2023-03-23 NOTE — Telephone Encounter (Signed)
Spoke to patient and she states confirmed the information that was obtained by initial phone call. Pt emphasized severe chest pain and excessive amounts of chest pain. Pt was concerned for syncopal episode. Pt reports that she thinks that she also might have COVID. After discussing with patient, she was advised to go to the ER for further evaluation. Pt also reports that she is going to have some one drive her as well.

## 2023-03-23 NOTE — ED Provider Notes (Signed)
[place] EMERGENCY DEPARTMENT AT [facility] Provider Note  CSN: [id number] Arrival date & time: 03/23/23 1055  Chief Complaint(s) Cough  HPI Donna Long is a 53 y.o. female with PMH SVT, HTN, GERD, anemia who presents emergency room for evaluation of a cough and a episode of presyncope.  She states that she has had upper respiratory symptoms for the last 5 days and has a positive COVID exposure.  She went to the use the restroom and was urinating when she multiple episodes back-to-back of sudden onset diaphoresis, lightheadedness, chest pain and shortness of breath.  She states that she was able to get to her bed and take a nap.  She awoke from a nap approximately 2 hours later and spoke with her cardiologist who recommended that she come to the emergency department for further evaluation.  Here in the ER she is asymptomatic and denying any chest pain, shortness of breath, abdominal pain, nausea, vomiting or other systemic symptoms.   Past Medical History Past Medical History:  Diagnosis Date   Anemia    d/t gynecological issues   Anxiety    Arthritis    Back injury    Blood transfusion without reported diagnosis    Chronic back pain    Depression    Dysrhythmia    HX SVT   Fatty liver    GERD (gastroesophageal reflux disease)    Hypertension    Sleep apnea    SVT (supraventricular tachycardia) [facility])    Patient Active Problem List   Diagnosis Date Noted   SVT (supraventricular tachycardia) (HCC) 11/16/2018   S/P vaginal hysterectomy 10/17/2018   Chest pain 06/30/2018   Hyperglycemia 06/30/2018   Protrusion of lumbar intervertebral disc 05/04/2017   Anemia 11/04/2016   Anxiety 11/04/2016   Home Medication(s) Prior to Admission medications   Medication Sig Start Date End Date Taking? Authorizing Provider  Acetaminophen Extra Strength 500 MG CAPS Take 2 capsules by mouth every 8 (eight) hours. 03/04/22   [provider]  aspirin 81 MG chewable tablet  Chew by mouth. Patient not taking: Reported on 08/16/2022 10/31/18   [provider]  esomeprazole (NEXIUM) 20 MG capsule Take 20 mg by mouth daily at 12 noon.    [provider]  gabapentin (NEURONTIN) 100 MG capsule Take 100 mg by mouth 2 (two) times daily. 03/18/22   [provider]  ibuprofen (ADVIL) 800 MG tablet Take 800 mg by mouth every 6 (six) hours as needed. 07/01/22   [provider]  Lysine 500 MG CAPS Take 500 mg by mouth daily in the afternoon.    [provider]  metoprolol succinate (TOPROL-XL) 50 MG 24 hr tablet TAKE 1 TABLET(50 MG) BY MOUTH DAILY WITH OR IMMEDIATELY FOLLOWING A MEAL 02/06/23   Quintella Reichert, MD  Past Surgical History Past Surgical History:  Procedure Laterality Date   DILITATION & CURRETTAGE/HYSTROSCOPY WITH NOVASURE ABLATION N/A 08/16/2017   Procedure: DILATATION & CURETTAGE/HYSTEROSCOPY WITH MINERVA  ENDOMETRIAL ABLATION;  Surgeon: Lazaro Arms, MD;  Location: [facility];  Service: Gynecology;  Laterality: N/A;   SHOULDER ARTHROSCOPY WITH DISTAL CLAVICLE RESECTION Right 02/24/2022   Procedure: SHOULDER ARTHROSCOPY WITH DISTAL CLAVICLE EXCISION;  Surgeon: Bjorn Pippin, MD;  Location: [facility];  Service: Orthopedics;  Laterality: Right;   SHOULDER ARTHROSCOPY WITH SUBACROMIAL DECOMPRESSION AND BICEP TENDON REPAIR Right 02/24/2022   Procedure: SHOULDER ARTHROSCOPY WITH SUBACROMIAL DECOMPRESSION AND BICEP TENDON REPAIR;  Surgeon: Bjorn Pippin, MD;  Location: [facility];  Service: Orthopedics;  Laterality: Right;   SVT ABLATION N/A 11/16/2018   Procedure: SVT ABLATION;  Surgeon: Marinus Maw, MD;  Location: [facility];  Service: Cardiovascular;  Laterality: N/A;   TUBAL LIGATION     VAGINAL HYSTERECTOMY N/A 10/17/2018   Procedure: HYSTERECTOMY VAGINAL;   Surgeon: Lazaro Arms, MD;  Location: [facility];  Service: Gynecology;  Laterality: N/A;   WRIST SURGERY Right    Family History Family History  Problem Relation Age of Onset   Hypertension Mother    Heart disease Mother    COPD Father    Hypertension Maternal Aunt    Diabetes Maternal Grandmother    Kidney disease Sister    Colon cancer Neg Hx    Esophageal cancer Neg Hx    Rectal cancer Neg Hx    Stomach cancer Neg Hx     Social History Social History   Tobacco Use   Smoking status: Former    Current packs/day: 0.00    Average packs/day: 1 pack/day for 33.0 years (33.0 ttl pk-yrs)    Types: Cigarettes    Start date: 04/03/1975    Quit date: 04/02/2008    Years since quitting: 14.9   Smokeless tobacco: Never  Vaping Use   Vaping status: Former  Substance Use Topics   Alcohol use: No   Drug use: No   Allergies Kenalog  [triamcinolone], Clindamycin/lincomycin, and Prednisolone  Review of Systems Review of Systems  Constitutional:  Positive for diaphoresis.  Cardiovascular:  Positive for chest pain.  Neurological:  Positive for light-headedness.    Physical Exam Vital Signs  I have reviewed the triage vital signs BP (!) 141/113 (BP Location: Left Arm)   Pulse 86   Temp 98 F (36.7 C) (Oral)   Resp 18   Ht 5' 6.5" (1.689 m)   Wt (!) 140 kg   LMP 07/05/2017 (Approximate)   SpO2 99%   BMI 49.07 kg/m   Physical Exam Vitals and nursing note reviewed.  Constitutional:      General: She is not in acute distress.    Appearance: She is well-developed.  HENT:     Head: Normocephalic and atraumatic.  Eyes:     Conjunctiva/sclera: Conjunctivae normal.  Cardiovascular:     Rate and Rhythm: Normal rate and regular rhythm.     Heart sounds: No murmur heard. Pulmonary:     Effort: Pulmonary effort is normal. No respiratory distress.     Breath sounds: Normal breath sounds.  Abdominal:     Palpations: Abdomen is soft.     Tenderness: There is no abdominal  tenderness.  Musculoskeletal:        General: No swelling.     Cervical back: Neck supple.  Skin:    General: Skin is warm  and dry.     Capillary Refill: Capillary refill takes less than 2 seconds.  Neurological:     Mental Status: She is alert.  Psychiatric:        Mood and Affect: Mood normal.     ED Results and Treatments Labs (all labs ordered are listed, but only abnormal results are displayed) Labs Reviewed  RESP PANEL BY RT-PCR (RSV, FLU A&B, COVID)  RVPGX2 - Abnormal; Notable for the following components:      Result Value   SARS Coronavirus 2 by RT PCR POSITIVE (*)    All other components within normal limits  COMPREHENSIVE METABOLIC PANEL  CBC WITH DIFFERENTIAL/PLATELET  TROPONIN I (HIGH SENSITIVITY)                                                                                                                          Radiology No results found.  Pertinent labs & imaging results that were available during my care of the patient were reviewed by me and considered in my medical decision making (see MDM for details).  Medications Ordered in ED Medications - No data to display                                                                                                                                   Procedures Procedures  (including critical care time)  Medical Decision Making / ED Course   This patient presents to the ED for concern of syncope, this involves an extensive number of treatment options, and is a complaint that carries with it a high risk of complications and morbidity.  The differential diagnosis includes orthostatic syncope, cardiogenic syncope, vasovagal syncope, electrolyte abnormality, dehydration, dysrhythmia, vasovagal, Hypoglycemia, Seizure, Autonomic Insufficiency  MDM: Seen in the emergency room for evaluation of a syncopal episode.  Physical exam is unremarkable outside of some nasal congestion.  Pulmonary exam without significant  wheezing.  Neurologic exam unremarkable with no focal motor or sensory deficits.  Laboratory evaluation is overall reassuring with negative high-sensitivity troponins.  Patient is COVID-positive which likely explains her upper respiratory complaints.  Chest x-ray unremarkable.  Given that the patient symptoms occurred while actively urinating, I do have higher suspicion for vasovagal syncope, but given patient's arrhythmia genic history, I did speak with the cardiologist on-call Dr. Jenene Slicker who will help arrange for a Zio patch to be mailed to the patient and patient will  follow-up closely with cardiology.  She is not orthostatic here in the emergency department and was observed in the ER for at least 3 and half hours without any return of symptoms.  At this time she does not meet inpatient criteria for admission and will be discharged with outpatient follow-up.   Additional history obtained:  -External records from outside source obtained and reviewed including: Chart review including previous notes, labs, imaging, consultation notes   Lab Tests: -I ordered, reviewed, and interpreted labs.   The pertinent results include:   Labs Reviewed  RESP PANEL BY RT-PCR (RSV, FLU A&B, COVID)  RVPGX2 - Abnormal; Notable for the following components:      Result Value   SARS Coronavirus 2 by RT PCR POSITIVE (*)    All other components within normal limits  COMPREHENSIVE METABOLIC PANEL  CBC WITH DIFFERENTIAL/PLATELET  TROPONIN I (HIGH SENSITIVITY)      Imaging Studies ordered: I ordered imaging studies including chest x-ray I independently visualized and interpreted imaging. I agree with the radiologist interpretation   Medicines ordered and prescription drug management: No orders of the defined types were placed in this encounter.   -I have reviewed the patients home medicines and have made adjustments as needed  Critical interventions none  Consultations Obtained: I requested  consultation with the cardiologist on-call,  and discussed lab and imaging findings as well as pertinent plan - they recommend: Female patient Zio patch and outpatient cardiology follow-up    Social Determinants of Health:  Factors impacting patients care include: none   Reevaluation: After the interventions noted above, I reevaluated the patient and found that they have :stayed the same  Co morbidities that complicate the patient evaluation  Past Medical History:  Diagnosis Date   Anemia    d/t gynecological issues   Anxiety    Arthritis    Back injury    Blood transfusion without reported diagnosis    Chronic back pain    Depression    Dysrhythmia    HX SVT   Fatty liver    GERD (gastroesophageal reflux disease)    Hypertension    Sleep apnea    SVT (supraventricular tachycardia) (HCC)       Dispostion: I considered admission for this patient, but at this time she does not meet inpatient criteria for admission and will be discharged with outpatient follow-up     Final Clinical Impression(s) / ED Diagnoses Final diagnoses:  None     @PCDICTATION @    Glendora Score, MD 03/23/23 [phone]

## 2023-03-23 NOTE — Telephone Encounter (Signed)
   Pt c/o of Chest Pain: STAT if active CP, including tightness, pressure, jaw pain, radiating pain to shoulder/upper arm/back, CP unrelieved by Nitro. Symptoms reported of SOB, nausea, vomiting, sweating.  1. Are you having CP right now?   No  2. Are you experiencing any other symptoms (ex. SOB, nausea, vomiting, sweating)?   Sweating, nausea  3. Is your CP continuous or coming and going?   Continuous for about 12-15 minutes  4. Have you taken Nitroglycerin?   No  5. How long have you been experiencing CP?   Episode this morning   6. If NO CP at time of call then end call with telling Pt to call back or call 911 if Chest pain returns prior to return call from triage team.   Patient stated about 2 hours ago she went to the bathroom and her chest started feeling tight, she turned completely white and was sweating profusely.  Patient stated she has not taken her medication today as yet.

## 2023-03-23 NOTE — ED Triage Notes (Signed)
Pt arrived via POV c/o headache, cough and elevated HR X 4 days. Pt reports calling Dr Bruna Potter office and being advised to seek evaluation in the ER. Pt reports her HR has been in the 140's at home. Pts HR in triage 83 BPM.

## 2023-03-23 NOTE — Discharge Instructions (Signed)
Vasovagal syncope

## 2023-03-23 NOTE — Telephone Encounter (Signed)
Dr. Jenene Slicker requesting 14 day Zio patch for syncope.

## 2023-03-28 NOTE — Telephone Encounter (Signed)
Agree with rec to go to the ED.

## 2023-03-29 DIAGNOSIS — R55 Syncope and collapse: Secondary | ICD-10-CM | POA: Diagnosis not present

## 2023-04-17 ENCOUNTER — Encounter: Payer: Self-pay | Admitting: Internal Medicine

## 2023-04-17 ENCOUNTER — Ambulatory Visit: Payer: Medicaid Other | Attending: Internal Medicine | Admitting: Internal Medicine

## 2023-04-17 VITALS — BP 142/86 | HR 62 | Ht 66.0 in | Wt 316.0 lb

## 2023-04-17 DIAGNOSIS — R002 Palpitations: Secondary | ICD-10-CM

## 2023-04-17 NOTE — Patient Instructions (Signed)

## 2023-04-17 NOTE — Progress Notes (Signed)
 HPI Donna Long returns today for followup. She is a pleasant morbidly obese 53 yo woman with SVT who underwent EP study and ablation several years ago. She has been bothered by PVC's. She has gained her weight back and since I saw her last year another 17 lbs. She has had trouble with her CPAP. She has not had syncope. She had Covid several weeks ago associated with a period of hypotension, diarrhea and vomiting. She has improved. She wore a cardiac monitor which showed one episode of NSVT as well as PVC's and PAC's. She admits to struggling with her weight.  Allergies  Allergen Reactions   Kenalog  [Triamcinolone] Itching   Clindamycin/Lincomycin Rash   Prednisolone Rash     Current Outpatient Medications  Medication Sig Dispense Refill   Acetaminophen Extra Strength 500 MG CAPS Take 2 capsules by mouth every 8 (eight) hours.     aspirin 81 MG chewable tablet Chew by mouth.     ibuprofen (ADVIL) 800 MG tablet Take 800 mg by mouth every 6 (six) hours as needed.     Lysine 500 MG CAPS Take 500 mg by mouth daily in the afternoon.     metoprolol succinate (TOPROL-XL) 50 MG 24 hr tablet TAKE 1 TABLET(50 MG) BY MOUTH DAILY WITH OR IMMEDIATELY FOLLOWING A MEAL 90 tablet 1   esomeprazole (NEXIUM) 20 MG capsule Take 20 mg by mouth daily at 12 noon.     gabapentin (NEURONTIN) 100 MG capsule Take 100 mg by mouth 2 (two) times daily.     No current facility-administered medications for this visit.     Past Medical History:  Diagnosis Date   Anemia    d/t gynecological issues   Anxiety    Arthritis    Back injury    Blood transfusion without reported diagnosis    Chronic back pain    Depression    Dysrhythmia    HX SVT   Fatty liver    GERD (gastroesophageal reflux disease)    Hypertension    Sleep apnea    SVT (supraventricular tachycardia) (HCC)     ROS:   All systems reviewed and negative except as noted in the HPI.   Past Surgical History:  Procedure Laterality Date    DILITATION & CURRETTAGE/HYSTROSCOPY WITH NOVASURE ABLATION N/A 08/16/2017   Procedure: DILATATION & CURETTAGE/HYSTEROSCOPY WITH MINERVA  ENDOMETRIAL ABLATION;  Surgeon: Lazaro Arms, MD;  Location: [facility];  Service: Gynecology;  Laterality: N/A;   SHOULDER ARTHROSCOPY WITH DISTAL CLAVICLE RESECTION Right 02/24/2022   Procedure: SHOULDER ARTHROSCOPY WITH DISTAL CLAVICLE EXCISION;  Surgeon: Bjorn Pippin, MD;  Location: [facility];  Service: Orthopedics;  Laterality: Right;   SHOULDER ARTHROSCOPY WITH SUBACROMIAL DECOMPRESSION AND BICEP TENDON REPAIR Right 02/24/2022   Procedure: SHOULDER ARTHROSCOPY WITH SUBACROMIAL DECOMPRESSION AND BICEP TENDON REPAIR;  Surgeon: Bjorn Pippin, MD;  Location:  SURGERY CENTER;  Service: Orthopedics;  Laterality: Right;   SVT ABLATION N/A 11/16/2018   Procedure: SVT ABLATION;  Surgeon: Marinus Maw, MD;  Location: [facility] INVASIVE CV LAB;  Service: Cardiovascular;  Laterality: N/A;   TUBAL LIGATION     VAGINAL HYSTERECTOMY N/A 10/17/2018   Procedure: HYSTERECTOMY VAGINAL;  Surgeon: Lazaro Arms, MD;  Location: [facility];  Service: Gynecology;  Laterality: N/A;   WRIST SURGERY Right      Family History  Problem Relation Age of Onset   Hypertension Mother    Heart disease Mother  COPD Father    Hypertension Maternal Aunt    Diabetes Maternal Grandmother    Kidney disease Sister    Colon cancer Neg Hx    Esophageal cancer Neg Hx    Rectal cancer Neg Hx    Stomach cancer Neg Hx      Social History   Socioeconomic History   Marital status: Single    Spouse name: Not on file   Number of children: 4   Years of education: Not on file   Highest education level: Not on file  Occupational History   Not on file  Tobacco Use   Smoking status: Former    Current packs/day: 0.00    Average packs/day: 1 pack/day for 33.0 years (33.0 ttl pk-yrs)    Types: Cigarettes    Start date: 04/03/1975    Quit date: 04/02/2008    Years since  quitting: 15.0   Smokeless tobacco: Never  Vaping Use   Vaping status: Former  Substance and Sexual Activity   Alcohol use: No   Drug use: No   Sexual activity: Not Currently    Birth control/protection: Surgical  Other Topics Concern   Not on file  Social History Narrative   Not on file   Social Drivers of Health   Financial Resource Strain: High Risk (03/06/2023)   Received from [facility]   Overall Financial Resource Strain (CARDIA)    Difficulty of Paying Living Expenses: Very hard  Food Insecurity: Food Insecurity Present (03/06/2023)   Received from [facility]   Hunger Vital Sign    Worried About Running Out of Food in the Last Year: Often true    Ran Out of Food in the Last Year: Often true  Transportation Needs: No Transportation Needs (03/06/2023)   Received from [facility] - Transportation    Lack of Transportation (Medical): No    Lack of Transportation (Non-Medical): No  Physical Activity: Sufficiently Active (03/06/2023)   Received from [facility]   Exercise Vital Sign    Days of Exercise per Week: 4 days    Minutes of Exercise per Session: 40 min  Stress: Stress Concern Present (03/06/2023)   Received from [facility] - Occupational Stress Questionnaire    Feeling of Stress : To some extent  Social Connections: Somewhat Isolated (03/06/2023)   Received from [facility]   Social Network    How would you rate your social network (family, work, friends)?: Restricted participation with some degree of social isolation  Intimate Partner Violence: Not At Risk (03/06/2023)   Received from [facility]   HITS    Over the last 12 months how often did your partner physically hurt you?: Never    Over the last 12 months how often did your partner insult you or talk down to you?: Never    Over the last 12 months how often did your partner threaten you with physical harm?: Never    Over the last 12 months  how often did your partner scream or curse at you?: Never     Ht 5\' 6"  (1.676 m)   Wt (!) 316 lb (143.3 kg)   LMP 07/05/2017 (Approximate)   BMI 51.00 kg/m   Physical Exam:  Well appearing NAD HEENT: Unremarkable Neck:  No JVD, no thyromegally Lymphatics:  No adenopathy Back:  No CVA tenderness Lungs:  Clear with no wheezes HEART:  Regular rate rhythm, no murmurs, no rubs, no clicks Abd:  soft, positive bowel sounds, no organomegally, no rebound, no guarding Ext:  2 plus pulses, no edema, no cyanosis, no clubbing Skin:  No rashes no nodules Neuro:  CN II through XII intact, motor grossly intact  Assess/Plan:  Palpitations - I have discussed the benign nature and asker her to continue the toprol, limit caffeine and ETOH and work on her sleep hygiene Sleep apnea - she has had trouble with her CPAP. I encouraged her to see Dr. Mayford Knife back. Obesity - her weight is up another 17 lbs since her last visit. We discussed meds for weight loss, diet and exercise and I encouraged her to reach out to her primary MD SVT - she has not had any symptoms since I saw her last.   Dorathy Daft

## 2023-09-14 ENCOUNTER — Other Ambulatory Visit: Payer: Self-pay | Admitting: Cardiology

## 2023-09-14 DIAGNOSIS — R002 Palpitations: Secondary | ICD-10-CM

## 2023-09-14 DIAGNOSIS — I471 Supraventricular tachycardia, unspecified: Secondary | ICD-10-CM

## 2023-10-05 ENCOUNTER — Other Ambulatory Visit: Payer: Self-pay | Admitting: Cardiology

## 2023-10-05 DIAGNOSIS — I471 Supraventricular tachycardia, unspecified: Secondary | ICD-10-CM

## 2023-10-05 DIAGNOSIS — R002 Palpitations: Secondary | ICD-10-CM

## 2023-10-18 ENCOUNTER — Telehealth: Payer: Self-pay | Admitting: Internal Medicine

## 2023-10-18 NOTE — Telephone Encounter (Signed)
 Patient calling to ask about having a heart cath done. Patient states that she has been having worsening shortness of breath and chest pain with exertion. Patient reports that this has been ongoing for about a year however it has gotten worse in the past several months. She states that she is having trouble with minimal exertion now. She describes it has chest pressure/tightness and trouble catching her breath. She states that symptoms resolve with rest. Patient has been given ER precautions and has been scheduled for an appointment 9/11

## 2023-10-18 NOTE — Telephone Encounter (Signed)
 Patient would like to discuss having a test done. She doesn't know the name of the test but she can describe it.

## 2023-10-18 NOTE — Progress Notes (Unsigned)
 Cardiology Office Note    Patient Name: Donna Long Date of Encounter: 10/18/2023  Primary Care Provider:  Emilio Joesph DEL, PA-C Primary Cardiologist:  Danelle Birmingham, MD Primary Electrophysiologist: None   Past Medical History    Past Medical History:  Diagnosis Date   Anemia    d/t gynecological issues   Anxiety    Arthritis    Back injury    Blood transfusion without reported diagnosis    Chronic back pain    Depression    Dysrhythmia    HX SVT   Fatty liver    GERD (gastroesophageal reflux disease)    Hypertension    Sleep apnea    SVT (supraventricular tachycardia) (HCC)     History of Present Illness  Donna Long is a 53 y.o. female with a PMH of SVT s/p ablation 11/16/2018, GERD, HTN, anxiety, OSA who presents today for plaint of chest pain and shortness of breath.  Donna Long was seen initially in 2018 by Dr. Delford for evaluation of palpitations and chest pain.  She reports palpitations developing at age 53 that terminated in the past with IV adenosine.  She completed a nuclear stress test in 2020 that was low risk and normal.  She was evaluated by Dr. Birmingham and underwent EP study and ablation in 11/16/2018.  She was evaluated by Dr. Shlomo for OSA in 2024.  She underwent a mask change and device adjustment at that time.  She was last seen by Dr. Birmingham on 04/17/2023 for follow-up.  During that visit she had been bothered by PVCs and had gained her weight back.  She was advised to continue metoprolol  and limit alcohol and caffeine.  Admitted to see Dr. Shlomo back for further assistance with sleep hygiene. Donna Long contacted our office yesterday with complaint of worsening shortness of breath and chest pain with exertion.  She reported worsening symptoms with exertion and trouble catching her breath.  Donna Long presents today for follow-up reports that she is experience chest pain and shortness of breath particularly during physical activity such as walking. The pain is  associated with chest tightness, a purplish discoloration of the chest and face, and radiation into the arm. These symptoms resolve with rest.  She has a history of supraventricular tachycardia and underwent an electrophysiology study and ablation in the past. She is currently on metoprolol  for her SVT. She has been trying to lose weight and increase her physical activity by walking and aiming for 10,000 steps a day. She mentions experiencing a persistent cough, which she attributes to lisinopril, a medication prescribed by her primary care physician. She has not taken lisinopril today due to the cough, which she describes as 'hacking' and 'nasty', causing her to gag. She reports significant fluid retention, gaining between six and ten pounds of fluid overnight, which resolves after resting. This has been ongoing for about a year and a half. She denies using salt in her diet but uses seasonings that may contain sodium. She has had prior diagnostic workups including lung X-rays and blood work, which were normal. She is concerned about her heart health, especially given her family history of her mother dying suddenly at age 58 from an unknown heart condition. Patient denies chest pain, palpitations, dyspnea, PND, orthopnea, nausea, vomiting, dizziness, syncope, edema, weight gain, or early satiety.  Discussed the use of AI scribe software for clinical note transcription with the patient, who gave verbal consent to proceed.  History of Present Illness   Review of  Systems  Please see the history of present illness.    All other systems reviewed and are otherwise negative except as noted above.  Physical Exam    Wt Readings from Last 3 Encounters:  04/17/23 (!) 316 lb (143.3 kg)  03/23/23 (!) 308 lb 10.3 oz (140 kg)  11/21/22 (!) 308 lb 10.3 oz (140 kg)   CD:Uyzmz were no vitals filed for this visit.,There is no height or weight on file to calculate BMI. GEN: Well nourished, well developed in no  acute distress Neck: No JVD; No carotid bruits Pulmonary: Clear to auscultation without rales, wheezing or rhonchi  Cardiovascular: Normal rate. Regular rhythm. Normal S1. Normal S2.   Murmurs: There is no murmur.  ABDOMEN: Soft, non-tender, non-distended EXTREMITIES: +1 bilateral lower extremity edema  EKG/LABS/ Recent Cardiac Studies   ECG personally reviewed by me today -sinus rhythm with rate of 80 bpm no acute changes consistent with previous EKG.  Risk Assessment/Calculations:          Lab Results  Component Value Date   WBC 8.7 03/23/2023   HGB 14.9 03/23/2023   HCT 47.8 (H) 03/23/2023   MCV 90.2 03/23/2023   PLT 261 03/23/2023   Lab Results  Component Value Date   CREATININE 0.70 03/23/2023   BUN 7 03/23/2023   NA 138 03/23/2023   K 4.0 03/23/2023   CL 102 03/23/2023   CO2 26 03/23/2023   Lab Results  Component Value Date   CHOL 146 06/30/2018   HDL 38 (L) 06/30/2018   LDLCALC 72 06/30/2018   TRIG 181 (H) 06/30/2018   CHOLHDL 3.8 06/30/2018    Lab Results  Component Value Date   HGBA1C 5.8 (H) 06/30/2018   Assessment & Plan    Assessment and Plan Assessment & Plan Angina with exertional chest pain and shortness of breath Exertional chest pain and shortness of breath with arm radiation, indicative of angina. Symptoms improve with rest. Family history of sudden cardiac death. Previous EP study and ablation performed. Current symptoms warrant further evaluation for coronary artery disease. - Order PET stress test at [facility] to evaluate coronary artery disease and assess global blood flow. Discussed risks including chest pain, shortness of breath, arrhythmia, dizziness, blood pressure fluctuation, myocardial infarction, stroke, nausea, vomiting, allergic reaction, radiation exposure, metallic taste sensation, and a 1 in 10,000 risk of life-threatening complications. - Order echocardiogram to assess for structural changes. - Increase metoprolol  to 75 mg  daily for anginal symptoms. - Prescribe nitroglycerin  sublingual tablets for use as needed for chest pain, with instructions to take up to three tablets five minutes apart and call 911 if pain persists after the third tablet.  Hypertension Hypertension managed with metoprolol  and lisinopril. Lisinopril causing persistent cough. Blood pressure 138/78 mmHg without lisinopril. Discussed switching to an angiotensin receptor blocker like losartan to avoid cough. - Contact primary care provider to discuss discontinuation of lisinopril and potential switch to an angiotensin receptor blocker like losartan. - Increase metoprolol  to 75 mg daily, which may also aid in blood pressure control.  Lower extremity edema Chronic lower extremity edema with significant daily weight fluctuations, suggestive of fluid retention. Symptoms have persisted for approximately 1.5 years. - Discuss dietary sodium restriction to less than 2000 mg per day. - Prescribe Lasix  (furosemide ) as needed for fluid retention, with instructions to monitor for increased frequency of use and potential need for potassium supplementation.   1.  History of SVT: - Patient reports no palpitations or episodes of arrhythmia since  previous follow-up. - Continue Toprol -XL 75 mg daily  2.  Chest pain: -Exertional chest pain and shortness of breath with arm radiation  - Order PET stress test - Order echocardiogram to assess for structural changes. - Increase metoprolol  to 75 mg daily for anginal symptoms. - Prescribe nitroglycerin  sublingual tablets for use as needed for chest pain, with instructions to take up to three tablets five minutes apart and call 911 if pain persists after the third tablet.  3.  History of OSA: - Continue current treatment plan per Dr. Shlomo and CPAP nightly  4.Hypertension Hypertension managed with metoprolol  and lisinopril. Lisinopril causing persistent cough. Blood pressure 138/78 mmHg without lisinopril.  Discussed switching to an angiotensin receptor blocker like losartan to avoid cough. - Contact primary care provider to discuss discontinuation of lisinopril  5. Lower extremity edema: Chronic lower extremity edema with significant daily weight fluctuations, suggestive of fluid retention. Symptoms have persisted for approximately 1.5 years. - Discuss dietary sodium restriction to less than 2000 mg per day. - Prescribe Lasix  (furosemide ) as needed for fluid retention, with instructions to monitor for increased frequency of use and potential need for potassium supplementation.      Disposition: Follow-up with Danelle Birmingham, MD or APP in 3 months Informed Consent   Shared Decision Making/Informed Consent The risks [chest pain, shortness of breath, cardiac arrhythmias, dizziness, blood pressure fluctuations, myocardial infarction, stroke/transient ischemic attack, nausea, vomiting, allergic reaction, radiation exposure, metallic taste sensation and life-threatening complications (estimated to be 1 in 10,000)], benefits (risk stratification, diagnosing coronary artery disease, treatment guidance) and alternatives of a cardiac PET stress test were discussed in detail with Donna Long and she agrees to proceed.      Signed, Wyn Raddle, Jackee Shove, NP 10/18/2023, 6:24 PM [facility]

## 2023-10-19 ENCOUNTER — Other Ambulatory Visit (HOSPITAL_COMMUNITY): Payer: Self-pay

## 2023-10-19 ENCOUNTER — Encounter: Payer: Self-pay | Admitting: Nurse Practitioner

## 2023-10-19 ENCOUNTER — Ambulatory Visit: Attending: Nurse Practitioner | Admitting: Nurse Practitioner

## 2023-10-19 VITALS — BP 138/78 | HR 87 | Ht 66.0 in | Wt 309.0 lb

## 2023-10-19 DIAGNOSIS — R079 Chest pain, unspecified: Secondary | ICD-10-CM | POA: Insufficient documentation

## 2023-10-19 DIAGNOSIS — I10 Essential (primary) hypertension: Secondary | ICD-10-CM | POA: Diagnosis present

## 2023-10-19 DIAGNOSIS — R0609 Other forms of dyspnea: Secondary | ICD-10-CM | POA: Insufficient documentation

## 2023-10-19 DIAGNOSIS — I471 Supraventricular tachycardia, unspecified: Secondary | ICD-10-CM | POA: Insufficient documentation

## 2023-10-19 DIAGNOSIS — G4733 Obstructive sleep apnea (adult) (pediatric): Secondary | ICD-10-CM | POA: Diagnosis present

## 2023-10-19 MED ORDER — OMRON 3 SERIES BP MONITOR DEVI
1.0000 | Freq: Every day | 0 refills | Status: AC
  Filled 2023-10-19: qty 1, 30d supply, fill #0

## 2023-10-19 MED ORDER — NITROGLYCERIN 0.4 MG SL SUBL: 0.4000 mg | SUBLINGUAL_TABLET | SUBLINGUAL | 3 refills | Status: AC | PRN

## 2023-10-19 MED ORDER — METOPROLOL SUCCINATE ER 25 MG PO TB24: 25.0000 mg | ORAL_TABLET | Freq: Every day | ORAL | 1 refills | Status: AC

## 2023-10-19 MED ORDER — FUROSEMIDE 20 MG PO TABS: 20.0000 mg | ORAL_TABLET | Freq: Every day | ORAL | 1 refills | Status: AC | PRN

## 2023-10-19 NOTE — Patient Instructions (Addendum)
 Medication Instructions:  START Toprol  25mg  Take 1 tablet daily additionally with the 50mg  tablet daily for a total for 75mg  daily START Lasix  20mg  Take 1 tablet daily as needed for swelling  A prescription for a blood pressure cuff has been sent to the pharmacy downstairs. Please stop by to pick it up. The cost is approximately $35, though some insurances plans may cover it. Be sure to use the cuff regularly to monitor blood pressure as recommended by your provider.   START Nitroglycerin  0.4mg  Take 1 as needed for emergency chest pain. Take first dose for emergency chest pain; WAIT 5 minutes and then take 2nd dose. IF still having pain if still having chest pain CALL 911 wait an additional 5 minutes before taking final dose. Do not take more than 3 doses in a day.  CONTACT YOUR PRIMARY CARE PROVIDER ABOUT THE LISINOPRIL AND THE COUGH  *If you need a refill on your cardiac medications before your next appointment, please call your pharmacy*  Lab Work: None ordered If you have labs (blood work) drawn today and your tests are completely normal, you will receive your results only by: MyChart Message (if you have MyChart) OR A paper copy in the mail If you have any lab test that is abnormal or we need to change your treatment, we will call you to review the results.  Testing/Procedures: Your physician has requested that you have an echocardiogram. Echocardiography is a painless test that uses sound waves to create images of your heart. It provides your doctor with information about the size and shape of your heart and how well your heart's chambers and valves are working. This procedure takes approximately one hour. There are no restrictions for this procedure. Please do NOT wear cologne, perfume, aftershave, or lotions (deodorant is allowed). Please arrive 15 minutes prior to your appointment time.  Please note: We ask at that you not bring children with you during ultrasound (echo/ vascular)  testing. Due to room size and safety concerns, children are not allowed in the ultrasound rooms during exams. Our front office staff cannot provide observation of children in our lobby area while testing is being conducted. An adult accompanying a patient to their appointment will only be allowed in the ultrasound room at the discretion of the ultrasound technician under special circumstances. We apologize for any inconvenience.  Follow-Up: At [facility], you and your health needs are our priority.  As part of our continuing mission to provide you with exceptional heart care, our providers are all part of one team.  This team includes your primary Cardiologist (physician) and Advanced Practice Providers or APPs (Physician Assistants and Nurse Practitioners) who all work together to provide you with the care you need, when you need it.  Your next appointment:   3 month(s)  Provider:   Wilbert Bihari, MD   We recommend signing up for the patient portal called MyChart.  Sign up information is provided on this After Visit Summary.  MyChart is used to connect with patients for Virtual Visits (Telemedicine).  Patients are able to view lab/test results, encounter notes, upcoming appointments, etc.  Non-urgent messages can be sent to your provider as well.   To learn more about what you can do with MyChart, go to [email or web address].   Other Instructions            Please report to Radiology at the [facility] Main Entrance 30 minutes early for your test.  [address] [place]  [place], [place] [address]  How to Prepare for Your Cardiac PET/CT Stress Test:  Nothing to eat or drink, except water , 3 hours prior to arrival time.  NO caffeine/decaffeinated products, or chocolate 12 hours prior to arrival. (Please note decaffeinated beverages (teas/coffees) still contain caffeine).  If you have caffeine within 12 hours prior, the test will need to be rescheduled.  Medication  instructions: Do not take erectile dysfunction medications for 72 hours prior to test (sildenafil, tadalafil) Do not take nitrates (isosorbide mononitrate, Ranexa) the day before or day of test Do not take tamsulosin the day before or morning of test Hold theophylline containing medications for 12 hours. Hold Dipyridamole 48 hours prior to the test.  You may take your remaining medications with water .  NO perfume, cologne or lotion on chest or abdomen area. FEMALES - Please avoid wearing dresses to this appointment.  Total time is 1 to 2 hours; you may want to bring reading material for the waiting time.  IF YOU THINK YOU MAY BE PREGNANT, OR ARE NURSING PLEASE INFORM THE TECHNOLOGIST.  In preparation for your appointment, medication and supplies will be purchased.  Appointment availability is limited, so if you need to cancel or reschedule, please call the Radiology Department Scheduler at [phone] 24 hours in advance to avoid a cancellation fee of $100.00  What to Expect When you Arrive:  Once you arrive and check in for your appointment, you will be taken to a preparation room within the Radiology Department.  A technologist or Nurse will obtain your medical history, verify that you are correctly prepped for the exam, and explain the procedure.  Afterwards, an IV will be started in your arm and electrodes will be placed on your skin for EKG monitoring during the stress portion of the exam. Then you will be escorted to the PET/CT scanner.  There, staff will get you positioned on the scanner and obtain a blood pressure and EKG.  During the exam, you will continue to be connected to the EKG and blood pressure machines.  A small, safe amount of a radioactive tracer will be injected in your IV to obtain a series of pictures of your heart along with an injection of a stress agent.    After your Exam:  It is recommended that you eat a meal and drink a caffeinated beverage to counter act any  effects of the stress agent.  Drink plenty of fluids for the remainder of the day and urinate frequently for the first couple of hours after the exam.  Your doctor will inform you of your test results within 7-10 business days.  For more information and frequently asked questions, please visit our website: [email or web address]  For questions about your test or how to prepare for your test, please call: Cardiac Imaging Nurse Navigators Office: [phone]

## 2023-10-30 ENCOUNTER — Other Ambulatory Visit (HOSPITAL_COMMUNITY): Payer: Self-pay

## 2023-11-24 ENCOUNTER — Ambulatory Visit: Payer: Self-pay | Admitting: Nurse Practitioner

## 2023-11-24 ENCOUNTER — Ambulatory Visit (HOSPITAL_COMMUNITY)
Admission: RE | Admit: 2023-11-24 | Discharge: 2023-11-24 | Disposition: A | Source: Ambulatory Visit | Attending: Nurse Practitioner | Admitting: Nurse Practitioner

## 2023-11-24 DIAGNOSIS — R0609 Other forms of dyspnea: Secondary | ICD-10-CM | POA: Insufficient documentation

## 2023-11-24 DIAGNOSIS — R0600 Dyspnea, unspecified: Secondary | ICD-10-CM | POA: Diagnosis not present

## 2023-11-24 DIAGNOSIS — R079 Chest pain, unspecified: Secondary | ICD-10-CM | POA: Insufficient documentation

## 2023-11-24 LAB — ECHOCARDIOGRAM COMPLETE
Area-P 1/2: 3.31 cm2
S' Lateral: 2.9 cm

## 2023-11-28 ENCOUNTER — Other Ambulatory Visit (HOSPITAL_COMMUNITY)

## 2023-12-01 ENCOUNTER — Encounter (HOSPITAL_COMMUNITY): Payer: Self-pay

## 2023-12-05 ENCOUNTER — Encounter (HOSPITAL_COMMUNITY)
Admission: RE | Admit: 2023-12-05 | Discharge: 2023-12-05 | Disposition: A | Discharge status: Home / Self Care | Source: Ambulatory Visit | Attending: Nurse Practitioner | Admitting: Nurse Practitioner

## 2023-12-05 DIAGNOSIS — R079 Chest pain, unspecified: Secondary | ICD-10-CM | POA: Insufficient documentation

## 2023-12-05 DIAGNOSIS — R0609 Other forms of dyspnea: Secondary | ICD-10-CM | POA: Diagnosis not present

## 2023-12-05 LAB — NM PET CT CARDIAC PERFUSION MULTI W/ABSOLUTE BLOODFLOW
MBFR: 3.69
Nuc Rest EF: 55 %
Nuc Stress EF: 61 %
Rest MBF: 0.67 ml/g/min
Rest Nuclear Isotope Dose: 29.9 mCi
ST Depression (mm): 0 mm
Stress MBF: 2.47 ml/g/min
Stress Nuclear Isotope Dose: 29.9 mCi
TID: 0.82

## 2023-12-05 MED ORDER — REGADENOSON 0.4 MG/5ML IV SOLN
0.4000 mg | Freq: Once | INTRAVENOUS | Status: AC
  Administered 2023-12-05: 0.4 mg via INTRAVENOUS

## 2023-12-05 MED ORDER — RUBIDIUM RB82 GENERATOR (RUBYFILL)
29.8700 | PACK | Freq: Once | INTRAVENOUS | Status: AC
  Administered 2023-12-05: 29.87 via INTRAVENOUS

## 2023-12-05 MED ORDER — REGADENOSON 0.4 MG/5ML IV SOLN
INTRAVENOUS | Status: AC
  Filled 2023-12-05: qty 5

## 2023-12-05 MED ORDER — RUBIDIUM RB82 GENERATOR (RUBYFILL)
29.8600 | PACK | Freq: Once | INTRAVENOUS | Status: AC
  Administered 2023-12-05: 29.86 via INTRAVENOUS

## 2023-12-05 NOTE — Progress Notes (Signed)
 Pt. Tolerated lexi scan well.

## 2023-12-10 ENCOUNTER — Other Ambulatory Visit: Payer: Self-pay | Admitting: Internal Medicine

## 2023-12-10 DIAGNOSIS — R002 Palpitations: Secondary | ICD-10-CM

## 2023-12-10 DIAGNOSIS — I471 Supraventricular tachycardia, unspecified: Secondary | ICD-10-CM

## 2024-02-02 ENCOUNTER — Telehealth: Payer: Self-pay

## 2024-02-02 NOTE — Telephone Encounter (Signed)
 SABRA

## 2024-02-09 ENCOUNTER — Ambulatory Visit: Attending: Cardiology | Admitting: Cardiology

## 2024-02-09 ENCOUNTER — Encounter: Payer: Self-pay | Admitting: Cardiology

## 2024-02-09 VITALS — BP 123/72 | HR 68 | Ht 66.0 in | Wt 300.0 lb

## 2024-02-09 DIAGNOSIS — R519 Headache, unspecified: Secondary | ICD-10-CM

## 2024-02-09 DIAGNOSIS — J3489 Other specified disorders of nose and nasal sinuses: Secondary | ICD-10-CM

## 2024-02-09 DIAGNOSIS — R0609 Other forms of dyspnea: Secondary | ICD-10-CM

## 2024-02-09 DIAGNOSIS — I10 Essential (primary) hypertension: Secondary | ICD-10-CM

## 2024-02-09 DIAGNOSIS — G8929 Other chronic pain: Secondary | ICD-10-CM

## 2024-02-09 DIAGNOSIS — G4733 Obstructive sleep apnea (adult) (pediatric): Secondary | ICD-10-CM

## 2024-02-09 NOTE — Progress Notes (Signed)
 "      Date:  02/09/2024   ID:  Donna Long, DOB 1970/08/11, MRN [id number]  PCP:  Celestia Harder, NP   [facility] Providers Cardiologist:  Danelle Birmingham, MD     Evaluation Performed:  Follow-Up Visit  Chief Complaint:  OSA  History of Present Illness:    Donna Long is a 54 y.o. female with a history of anxiety, depression, SVT, GERD, hypertension.  She had a sleep study ordered by Dr. Birmingham for excessive daytime sleepiness which showed mild OSA with AHI of 8.3/hr and underwent CPAP titration to 6cm H2O.  She never followed up because she was doing fine with her PAP and did not think that she needed to be seen.  She was seen back by Dr. Birmingham and was having trouble with her CPAP and was referred to sleep medicine.    At her last OV she told me that when she started CPAP it took her a while to get used to her PAP . She complained that the mask didn't fit but had not had any new supplies since she started in 2020. She also needed new equipment and wanted a new device at that time. She was complaining of severe daytime sleepiness and snoring as well.  She is now on CPAP at 6 cm H2O.  She goes to bed around 10-11PM and then wakes up at 12:30AM due HAs and then lays with the mask on and cannot get back to sleep for hours. She feels very tired in the am and sleepy all day and has to nap during the day.  She has HAs during the day that she thinks are related to chronic sinusitis.  She has been seeing ENT and was told that she needs a nasal septoplasty.  She then went to another ENT because the first ENT did not schedule surgery. She thinks that if she loses weight and gets her nasal septum fixed she likely would not have OSA. When she uses her PAP device she has mouth and nasal dryness. SHe uses the nasal mask and is intolerant to a FFM.  I had ordered a nasal cushion mask but never got it.  She uses nasal saline and nasal steroid.   An Epworth Sleepiness Scale score was calculated the office today  and this endorsed at 4 arguing against residual daytime sleepiness. Patient also has problems with severe grinding of her teeth.  She denies any hypnogognic hallucinations or cataplectic events.  She has problems with restless legs as well.    She had been following with Dr. Birmingham with EP for SVT and HTN and will now be following with me upon his retirement. She complains that her feet are blue when she sits for a while. She had a stress PET CT 11/2023 showing no ischemia and no coronary Ca and normal LVF.  Past Medical History:  Diagnosis Date   Anemia    d/t gynecological issues   Anxiety    Arthritis    Back injury    Blood transfusion without reported diagnosis    Chronic back pain    Depression    Dysrhythmia    HX SVT   Fatty liver    GERD (gastroesophageal reflux disease)    Hypertension    Sleep apnea    SVT (supraventricular tachycardia)    Past Surgical History:  Procedure Laterality Date   DILITATION & CURRETTAGE/HYSTROSCOPY WITH NOVASURE ABLATION N/A 08/16/2017   Procedure: DILATATION & CURETTAGE/HYSTEROSCOPY WITH MINERVA  ENDOMETRIAL ABLATION;  Surgeon: Jayne Vonn DEL, MD;  Location: [facility];  Service: Gynecology;  Laterality: N/A;   SHOULDER ARTHROSCOPY WITH DISTAL CLAVICLE RESECTION Right 02/24/2022   Procedure: SHOULDER ARTHROSCOPY WITH DISTAL CLAVICLE EXCISION;  Surgeon: Cristy Bonner DASEN, MD;  Location: [facility];  Service: Orthopedics;  Laterality: Right;   SHOULDER ARTHROSCOPY WITH SUBACROMIAL DECOMPRESSION AND BICEP TENDON REPAIR Right 02/24/2022   Procedure: SHOULDER ARTHROSCOPY WITH SUBACROMIAL DECOMPRESSION AND BICEP TENDON REPAIR;  Surgeon: Cristy Bonner DASEN, MD;  Location: [facility];  Service: Orthopedics;  Laterality: Right;   SVT ABLATION N/A 11/16/2018   Procedure: SVT ABLATION;  Surgeon: Waddell Danelle ORN, MD;  Location: [facility];  Service: Cardiovascular;  Laterality: N/A;   TUBAL LIGATION     VAGINAL HYSTERECTOMY N/A  10/17/2018   Procedure: HYSTERECTOMY VAGINAL;  Surgeon: Jayne Vonn DEL, MD;  Location: [facility];  Service: Gynecology;  Laterality: N/A;   WRIST SURGERY Right      Current Meds  Medication Sig   Acetaminophen  Extra Strength 500 MG CAPS Take 2 capsules by mouth every 8 (eight) hours.   aspirin  EC 81 MG tablet Take 81 mg by mouth daily. Swallow whole. (Patient taking differently: Take 81 mg by mouth every 6 (six) hours as needed for mild pain (pain score 1-3) (patient take sometimes). Swallow whole.)   Blood Pressure Monitoring (OMRON 3 SERIES BP MONITOR) DEVI Use to measure blood pressure daily.   estradiol (VIVELLE-DOT) 0.025 MG/24HR Place 1 patch onto the skin 2 (two) times a week.   furosemide  (LASIX ) 20 MG tablet Take 1 tablet (20 mg total) by mouth daily as needed for edema.   ibuprofen  (ADVIL ) 800 MG tablet Take 800 mg by mouth every 6 (six) hours as needed.   Lysine 500 MG CAPS Take 500 mg by mouth daily in the afternoon.   metoprolol  succinate (TOPROL  XL) 25 MG 24 hr tablet Take 1 tablet (25 mg total) by mouth daily. (Patient taking differently: Take 25 mg by mouth as needed (palpatations).)   metoprolol  succinate (TOPROL -XL) 50 MG 24 hr tablet TAKE 1 TABLET(50 MG) BY MOUTH DAILY WITH OR IMMEDIATELY FOLLOWING A MEAL   nitroGLYCERIN  (NITROSTAT ) 0.4 MG SL tablet Place 1 tablet (0.4 mg total) under the tongue every 5 (five) minutes as needed.     Allergies:   Kenalog  [triamcinolone], Clindamycin/lincomycin, and Prednisolone   Social History   Tobacco Use   Smoking status: Former    Current packs/day: 0.00    Average packs/day: 1 pack/day for 33.0 years (33.0 ttl pk-yrs)    Types: Cigarettes    Start date: 04/03/1975    Quit date: 04/02/2008    Years since quitting: 15.8   Smokeless tobacco: Never  Vaping Use   Vaping status: Former  Substance Use Topics   Alcohol use: No   Drug use: No     Family Hx: The patient's family history includes COPD in her father; Diabetes in her  maternal grandmother; Heart disease in her mother; Hypertension in her maternal aunt and mother; Kidney disease in her sister. There is no history of Colon cancer, Esophageal cancer, Rectal cancer, or Stomach cancer.  ROS:   Please see the history of present illness.     All other systems reviewed and are negative.   Prior Sleep studies:   The following studies were reviewed today:  PAP compliance download  Labs/Other Tests and Data Reviewed:     Recent Labs: 03/23/2023: ALT 53; BUN 7; Creatinine, Ser  0.70; Hemoglobin 14.9; Platelets 261; Potassium 4.0; Sodium 138   Wt Readings from Last 3 Encounters:  02/09/24 300 lb (136.1 kg)  10/19/23 (!) 309 lb (140.2 kg)  04/17/23 (!) 316 lb (143.3 kg)     Risk Assessment/Calculations:      Objective:    Vital Signs:  BP 123/72   Pulse 68   Ht 5' 6 (1.676 m)   Wt 300 lb (136.1 kg)   LMP 07/05/2017   SpO2 97%   BMI 48.42 kg/m   GEN: Well nourished, well developed in no acute distress HEENT: Normal NECK: No JVD; No carotid bruits LYMPHATICS: No lymphadenopathy CARDIAC:RRR, no murmurs, rubs, gallops RESPIRATORY:  Clear to auscultation without rales, wheezing or rhonchi  ABDOMEN: Soft, non-tender, non-distended MUSCULOSKELETAL:  No edema; No deformity  SKIN: Warm and dry NEUROLOGIC:  Alert and oriented x 3 PSYCHIATRIC:  Normal affect  ASSESSMENT & PLAN:    OSA -  The PAP download performed by his DME was personally reviewed and interpreted by me today and showed an AHI of 0.5 /hr on 6 cm H2O with 97 % compliance in using more than 4 hours nightly.  The patient has been using and benefiting from PAP use and will continue to benefit from therapy.  -will order a nasal cushion mask -check ONO on CPAP to see if she has residual nocturnal hypoxemia>>she has nighttime HAs and during the day and suspect that they are migraines but need to make sure it is not due to hypoxemia -she has a deviated nasal septum and has been told by 2 ENTs  that she needs surgery but never scheduled it -I will refer her to Dr. Carlie as she may be able to get off CPAP with weight loss and nasal septoplasty  Hypertension - BP well-controlled - Continue lisinopril 10 mg daily Toprol -XL 50 mg daily with as needed refills -I have personally reviewed and interpreted outside labs performed by patient's PCP which showed serum creatinine 0.7 and potassium 4 on 03/23/2023  SOB -recent stress PET CT showed no ischemia, normal LVF and no coronary Ca -2D echo 11/2023: EF 65-70%, mild MR -she has started exercising and SOB has improved>>Suspect that her SOB is related to obesity and she is working on weight loss  Headaches -she has been having nocturnal and daytime HAs ? Migraines -do not think that this is related to OSA -encouraged her to talk with her PCP to refer her to HA specialist    Total time of encounter: 20 minutes total time of encounter, including 15 minutes spent in face-to-face patient care on the date of this encounter. This time includes coordination of care and counseling regarding above mentioned problem list. Remainder of non-face-to-face time involved reviewing chart documents/testing relevant to the patient encounter and documentation in the medical record. I have independently reviewed documentation from referring provider.    Medication Adjustments/Labs and Tests Ordered: Current medicines are reviewed at length with the patient today.  Concerns regarding medicines are outlined above.   Tests Ordered: No orders of the defined types were placed in this encounter.   Medication Changes: No orders of the defined types were placed in this encounter.   Follow Up:  In Person in 1 year(s)  Signed, Wilbert Bihari, MD  02/09/2024 10:46 AM     "

## 2024-02-09 NOTE — Patient Instructions (Addendum)
 Medication Instructions:  Your physician recommends that you continue on your current medications as directed. Please refer to the Current Medication list given to you today.  *If you need a refill on your cardiac medications before your next appointment, please call your pharmacy*  Lab Work: NONE If you have labs (blood work) drawn today and your tests are completely normal, you will receive your results only by: MyChart Message (if you have MyChart) OR A paper copy in the mail If you have any lab test that is abnormal or we need to change your treatment, we will call you to review the results.  Testing/Procedures: NONE  Follow-Up: At [facility], you and your health needs are our priority.  As part of our continuing mission to provide you with exceptional heart care, our providers are all part of one team.  This team includes your primary Cardiologist (physician) and Advanced Practice Providers or APPs (Physician Assistants and Nurse Practitioners) who all work together to provide you with the care you need, when you need it.  Your next appointment:   1 year(s)  Provider:   Wilbert Bihari, MD  We recommend signing up for the patient portal called MyChart.  Sign up information is provided on this After Visit Summary.  MyChart is used to connect with patients for Virtual Visits (Telemedicine).  Patients are able to view lab/test results, encounter notes, upcoming appointments, etc.  Non-urgent messages can be sent to your provider as well.   To learn more about what you can do with MyChart, go to [email or web address].   Other Instructions Referral to Dr. Carlie (ENT)  Dr. Bihari had ordered a new mask and ONO these will come from your DME. If you have not heard from your DME in 2-3 weeks call/ MyChart our office.

## 2024-02-09 NOTE — Addendum Note (Signed)
 Addended by: Artie Mcintyre A on: 02/09/2024 10:53 AM   Modules accepted: Orders

## 2024-02-15 ENCOUNTER — Telehealth: Payer: Self-pay | Admitting: *Deleted

## 2024-02-15 DIAGNOSIS — G4733 Obstructive sleep apnea (adult) (pediatric): Secondary | ICD-10-CM

## 2024-02-15 DIAGNOSIS — I10 Essential (primary) hypertension: Secondary | ICD-10-CM

## 2024-02-15 NOTE — Telephone Encounter (Signed)
-----   Message from Wilbert Bihari, MD sent at 02/09/2024 10:43 AM EST ----- Order ONO on CPAP Order new nasal cushions mask>>please call DME to set up appt for mask fitting

## 2024-02-15 NOTE — Telephone Encounter (Signed)
Orders placed to [facility] via community message.
